# Patient Record
Sex: Female | Born: 1975 | Race: White | Hispanic: No | Marital: Married | Smoking: Never smoker
Health system: Southern US, Community
[De-identification: ages and names within clinical notes are randomized; demographics above are authoritative.]

## PROBLEM LIST (undated history)

## (undated) DIAGNOSIS — K219 Gastro-esophageal reflux disease without esophagitis: Secondary | ICD-10-CM

## (undated) DIAGNOSIS — K8020 Calculus of gallbladder without cholecystitis without obstruction: Secondary | ICD-10-CM

## (undated) DIAGNOSIS — F32A Depression, unspecified: Secondary | ICD-10-CM

## (undated) DIAGNOSIS — J45909 Unspecified asthma, uncomplicated: Secondary | ICD-10-CM

## (undated) DIAGNOSIS — F419 Anxiety disorder, unspecified: Secondary | ICD-10-CM

## (undated) DIAGNOSIS — N189 Chronic kidney disease, unspecified: Secondary | ICD-10-CM

## (undated) DIAGNOSIS — F329 Major depressive disorder, single episode, unspecified: Secondary | ICD-10-CM

## (undated) HISTORY — DX: Anxiety disorder, unspecified: F41.9

## (undated) HISTORY — DX: Gastro-esophageal reflux disease without esophagitis: K21.9

## (undated) HISTORY — DX: Unspecified asthma, uncomplicated: J45.909

## (undated) HISTORY — DX: Calculus of gallbladder without cholecystitis without obstruction: K80.20

## (undated) HISTORY — DX: Depression, unspecified: F32.A

## (undated) HISTORY — DX: Major depressive disorder, single episode, unspecified: F32.9

---

## 1999-10-03 ENCOUNTER — Inpatient Hospital Stay (HOSPITAL_COMMUNITY): Admission: AD | Admit: 1999-10-03 | Discharge: 1999-10-03 | Payer: Self-pay | Admitting: Obstetrics and Gynecology

## 1999-11-25 ENCOUNTER — Ambulatory Visit (HOSPITAL_BASED_OUTPATIENT_CLINIC_OR_DEPARTMENT_OTHER): Admission: RE | Admit: 1999-11-25 | Discharge: 1999-11-25 | Payer: Self-pay | Admitting: Orthopedic Surgery

## 2000-07-23 ENCOUNTER — Other Ambulatory Visit: Admission: RE | Admit: 2000-07-23 | Discharge: 2000-07-23 | Payer: Self-pay | Admitting: Internal Medicine

## 2002-05-30 ENCOUNTER — Other Ambulatory Visit: Admission: RE | Admit: 2002-05-30 | Discharge: 2002-05-30 | Payer: Self-pay | Admitting: Obstetrics and Gynecology

## 2002-10-23 ENCOUNTER — Other Ambulatory Visit: Admission: RE | Admit: 2002-10-23 | Discharge: 2002-10-23 | Payer: Self-pay | Admitting: Obstetrics and Gynecology

## 2003-03-16 ENCOUNTER — Other Ambulatory Visit: Admission: RE | Admit: 2003-03-16 | Discharge: 2003-03-16 | Payer: Self-pay | Admitting: Obstetrics and Gynecology

## 2003-08-13 ENCOUNTER — Ambulatory Visit (HOSPITAL_COMMUNITY): Admission: RE | Admit: 2003-08-13 | Discharge: 2003-08-13 | Payer: Self-pay | Admitting: Obstetrics and Gynecology

## 2003-08-21 ENCOUNTER — Inpatient Hospital Stay (HOSPITAL_COMMUNITY): Admission: AD | Admit: 2003-08-21 | Discharge: 2003-08-21 | Payer: Self-pay | Admitting: Obstetrics and Gynecology

## 2003-09-03 ENCOUNTER — Ambulatory Visit (HOSPITAL_COMMUNITY): Admission: RE | Admit: 2003-09-03 | Discharge: 2003-09-03 | Payer: Self-pay | Admitting: Obstetrics and Gynecology

## 2003-10-29 ENCOUNTER — Ambulatory Visit (HOSPITAL_COMMUNITY): Admission: RE | Admit: 2003-10-29 | Discharge: 2003-10-29 | Payer: Self-pay | Admitting: Obstetrics and Gynecology

## 2003-11-06 ENCOUNTER — Observation Stay (HOSPITAL_COMMUNITY): Admission: AD | Admit: 2003-11-06 | Discharge: 2003-11-07 | Payer: Self-pay | Admitting: Obstetrics and Gynecology

## 2003-11-09 ENCOUNTER — Inpatient Hospital Stay (HOSPITAL_COMMUNITY): Admission: AD | Admit: 2003-11-09 | Discharge: 2003-11-11 | Payer: Self-pay | Admitting: Obstetrics and Gynecology

## 2003-11-13 ENCOUNTER — Inpatient Hospital Stay (HOSPITAL_COMMUNITY): Admission: RE | Admit: 2003-11-13 | Discharge: 2003-11-13 | Payer: Self-pay | Admitting: Obstetrics and Gynecology

## 2003-11-15 ENCOUNTER — Inpatient Hospital Stay (HOSPITAL_COMMUNITY): Admission: AD | Admit: 2003-11-15 | Discharge: 2003-11-15 | Payer: Self-pay | Admitting: Obstetrics and Gynecology

## 2003-11-15 ENCOUNTER — Inpatient Hospital Stay (HOSPITAL_COMMUNITY): Admission: AD | Admit: 2003-11-15 | Discharge: 2003-11-21 | Payer: Self-pay | Admitting: Obstetrics and Gynecology

## 2003-11-18 ENCOUNTER — Encounter (INDEPENDENT_AMBULATORY_CARE_PROVIDER_SITE_OTHER): Payer: Self-pay | Admitting: *Deleted

## 2003-11-22 ENCOUNTER — Encounter: Admission: RE | Admit: 2003-11-22 | Discharge: 2003-12-22 | Payer: Self-pay | Admitting: Obstetrics and Gynecology

## 2003-12-14 ENCOUNTER — Ambulatory Visit (HOSPITAL_COMMUNITY): Admission: RE | Admit: 2003-12-14 | Discharge: 2003-12-14 | Payer: Self-pay | Admitting: Obstetrics and Gynecology

## 2004-01-11 ENCOUNTER — Other Ambulatory Visit: Admission: RE | Admit: 2004-01-11 | Discharge: 2004-01-11 | Payer: Self-pay | Admitting: Obstetrics and Gynecology

## 2004-07-25 ENCOUNTER — Ambulatory Visit: Payer: Self-pay | Admitting: Gastroenterology

## 2004-08-30 ENCOUNTER — Ambulatory Visit: Payer: Self-pay | Admitting: Gastroenterology

## 2004-10-11 ENCOUNTER — Ambulatory Visit: Payer: Self-pay | Admitting: Gastroenterology

## 2005-03-23 ENCOUNTER — Ambulatory Visit: Payer: Self-pay | Admitting: Internal Medicine

## 2005-04-11 ENCOUNTER — Ambulatory Visit: Payer: Self-pay | Admitting: Family Medicine

## 2005-04-27 ENCOUNTER — Ambulatory Visit: Payer: Self-pay | Admitting: Family Medicine

## 2005-05-01 ENCOUNTER — Encounter: Admission: RE | Admit: 2005-05-01 | Discharge: 2005-05-01 | Payer: Self-pay | Admitting: Family Medicine

## 2005-05-15 HISTORY — PX: CHOLECYSTECTOMY: SHX55

## 2005-09-18 ENCOUNTER — Ambulatory Visit: Payer: Self-pay | Admitting: Family Medicine

## 2005-11-10 ENCOUNTER — Encounter (INDEPENDENT_AMBULATORY_CARE_PROVIDER_SITE_OTHER): Payer: Self-pay | Admitting: Specialist

## 2005-11-10 ENCOUNTER — Ambulatory Visit (HOSPITAL_COMMUNITY): Admission: RE | Admit: 2005-11-10 | Discharge: 2005-11-10 | Payer: Self-pay | Admitting: General Surgery

## 2005-11-13 ENCOUNTER — Ambulatory Visit: Payer: Self-pay | Admitting: Family Medicine

## 2006-07-16 ENCOUNTER — Ambulatory Visit (HOSPITAL_COMMUNITY): Admission: RE | Admit: 2006-07-16 | Discharge: 2006-07-16 | Payer: Self-pay | Admitting: *Deleted

## 2006-08-10 ENCOUNTER — Inpatient Hospital Stay (HOSPITAL_COMMUNITY): Admission: AD | Admit: 2006-08-10 | Discharge: 2006-08-13 | Payer: Self-pay | Admitting: *Deleted

## 2006-09-04 DIAGNOSIS — F329 Major depressive disorder, single episode, unspecified: Secondary | ICD-10-CM | POA: Insufficient documentation

## 2006-09-04 DIAGNOSIS — S6990XA Unspecified injury of unspecified wrist, hand and finger(s), initial encounter: Secondary | ICD-10-CM | POA: Insufficient documentation

## 2006-09-04 DIAGNOSIS — F321 Major depressive disorder, single episode, moderate: Secondary | ICD-10-CM | POA: Insufficient documentation

## 2006-09-04 DIAGNOSIS — F32A Depression, unspecified: Secondary | ICD-10-CM | POA: Insufficient documentation

## 2006-09-04 DIAGNOSIS — S6980XA Other specified injuries of unspecified wrist, hand and finger(s), initial encounter: Secondary | ICD-10-CM | POA: Insufficient documentation

## 2006-09-04 DIAGNOSIS — K319 Disease of stomach and duodenum, unspecified: Secondary | ICD-10-CM | POA: Insufficient documentation

## 2006-09-04 DIAGNOSIS — K219 Gastro-esophageal reflux disease without esophagitis: Secondary | ICD-10-CM

## 2006-09-04 DIAGNOSIS — Z9189 Other specified personal risk factors, not elsewhere classified: Secondary | ICD-10-CM

## 2007-02-27 LAB — CONVERTED CEMR LAB: Pap Smear: NORMAL

## 2007-07-18 ENCOUNTER — Ambulatory Visit: Payer: Self-pay | Admitting: Family Medicine

## 2007-07-18 LAB — CONVERTED CEMR LAB
Protein, U semiquant: NEGATIVE
Urobilinogen, UA: NEGATIVE
WBC Urine, dipstick: NEGATIVE

## 2007-07-24 ENCOUNTER — Telehealth (INDEPENDENT_AMBULATORY_CARE_PROVIDER_SITE_OTHER): Payer: Self-pay | Admitting: *Deleted

## 2007-07-24 LAB — CONVERTED CEMR LAB
Basophils Absolute: 0.1 10*3/uL (ref 0.0–0.1)
Basophils Relative: 2 % — ABNORMAL HIGH (ref 0.0–1.0)
CO2: 29 meq/L (ref 19–32)
Cholesterol: 176 mg/dL (ref 0–200)
Creatinine, Ser: 0.9 mg/dL (ref 0.4–1.2)
GFR calc Af Amer: 92 mL/min
HCT: 40.7 % (ref 36.0–46.0)
HDL: 46.5 mg/dL (ref >39.0)
Hemoglobin: 13.4 g/dL (ref 12.0–15.0)
LDL Cholesterol: 111 mg/dL — ABNORMAL HIGH (ref 0–99)
Lymphocytes Relative: 66.5 % — ABNORMAL HIGH (ref 12.0–46.0)
MCHC: 33 g/dL (ref 30.0–36.0)
Monocytes Absolute: 0.7 10*3/uL (ref 0.2–0.7)
Monocytes Relative: 18.3 % — ABNORMAL HIGH (ref 3.0–11.0)
Neutro Abs: 0.4 10*3/uL — ABNORMAL LOW (ref 1.4–7.7)
Neutrophils Relative %: 11.9 % — ABNORMAL LOW (ref 43.0–77.0)
Potassium: 4.2 meq/L (ref 3.5–5.1)
RDW: 11.9 % (ref 11.5–14.6)
Sodium: 139 meq/L (ref 135–145)
TSH: 1.45 microintl units/mL (ref 0.35–5.50)
Total Bilirubin: 0.7 mg/dL (ref 0.3–1.2)
Total Protein: 7.3 g/dL (ref 6.0–8.3)

## 2007-08-05 ENCOUNTER — Telehealth (INDEPENDENT_AMBULATORY_CARE_PROVIDER_SITE_OTHER): Payer: Self-pay | Admitting: *Deleted

## 2007-08-05 DIAGNOSIS — R1011 Right upper quadrant pain: Secondary | ICD-10-CM | POA: Insufficient documentation

## 2007-08-06 ENCOUNTER — Telehealth (INDEPENDENT_AMBULATORY_CARE_PROVIDER_SITE_OTHER): Payer: Self-pay | Admitting: *Deleted

## 2007-08-07 ENCOUNTER — Encounter: Payer: Self-pay | Admitting: Family Medicine

## 2007-08-08 ENCOUNTER — Encounter (INDEPENDENT_AMBULATORY_CARE_PROVIDER_SITE_OTHER): Payer: Self-pay | Admitting: *Deleted

## 2007-08-22 ENCOUNTER — Ambulatory Visit: Payer: Self-pay | Admitting: Family Medicine

## 2007-08-23 LAB — CONVERTED CEMR LAB
Basophils Relative: 0.9 % (ref 0.0–1.0)
Eosinophils Relative: 1.4 % (ref 0.0–5.0)
Lymphocytes Relative: 24.4 % (ref 12.0–46.0)
Monocytes Relative: 6.6 % (ref 3.0–12.0)
Neutrophils Relative %: 66.7 % (ref 43.0–77.0)
Platelets: 284 10*3/uL (ref 150–400)
RBC: 4.6 M/uL (ref 3.87–5.11)
WBC: 6.8 10*3/uL (ref 4.5–10.5)

## 2007-08-25 ENCOUNTER — Encounter (INDEPENDENT_AMBULATORY_CARE_PROVIDER_SITE_OTHER): Payer: Self-pay | Admitting: *Deleted

## 2007-08-28 ENCOUNTER — Ambulatory Visit: Payer: Self-pay | Admitting: Gastroenterology

## 2007-10-15 ENCOUNTER — Ambulatory Visit: Payer: Self-pay | Admitting: Family Medicine

## 2007-10-21 ENCOUNTER — Encounter: Admission: RE | Admit: 2007-10-21 | Discharge: 2007-10-21 | Payer: Self-pay | Admitting: *Deleted

## 2007-10-30 ENCOUNTER — Encounter: Payer: Self-pay | Admitting: Family Medicine

## 2008-01-14 ENCOUNTER — Encounter: Payer: Self-pay | Admitting: Family Medicine

## 2008-01-14 ENCOUNTER — Telehealth (INDEPENDENT_AMBULATORY_CARE_PROVIDER_SITE_OTHER): Payer: Self-pay | Admitting: *Deleted

## 2008-01-23 ENCOUNTER — Telehealth (INDEPENDENT_AMBULATORY_CARE_PROVIDER_SITE_OTHER): Payer: Self-pay | Admitting: *Deleted

## 2008-03-06 ENCOUNTER — Ambulatory Visit: Payer: Self-pay | Admitting: Family Medicine

## 2008-04-21 ENCOUNTER — Telehealth: Payer: Self-pay | Admitting: Family Medicine

## 2008-07-30 ENCOUNTER — Ambulatory Visit: Payer: Self-pay | Admitting: Family Medicine

## 2008-07-30 DIAGNOSIS — J019 Acute sinusitis, unspecified: Secondary | ICD-10-CM | POA: Insufficient documentation

## 2008-10-02 ENCOUNTER — Ambulatory Visit: Payer: Self-pay | Admitting: Family Medicine

## 2008-10-02 DIAGNOSIS — R5381 Other malaise: Secondary | ICD-10-CM

## 2008-10-02 DIAGNOSIS — R5383 Other fatigue: Secondary | ICD-10-CM

## 2008-10-13 LAB — CONVERTED CEMR LAB
AST: 19 units/L (ref 0–37)
Albumin: 4.3 g/dL (ref 3.5–5.2)
Alkaline Phosphatase: 57 units/L (ref 39–117)
BUN: 12 mg/dL (ref 6–23)
Calcium: 9.3 mg/dL (ref 8.4–10.5)
Chloride: 107 meq/L (ref 96–112)
Creatinine, Ser: 0.9 mg/dL (ref 0.4–1.2)
Eosinophils Relative: 0.2 % (ref 0.0–5.0)
Free T4: 0.6 ng/dL (ref 0.6–1.6)
GFR calc non Af Amer: 75.57 mL/min (ref >60)
HDL: 63.8 mg/dL (ref >39.00)
Lymphocytes Relative: 22.3 % (ref 12.0–46.0)
MCV: 91.9 fL (ref 78.0–100.0)
Neutrophils Relative %: 70.7 % (ref 43.0–77.0)
Platelets: 236 10*3/uL (ref 150.0–400.0)
Total CHOL/HDL Ratio: 3
Total Protein: 7.1 g/dL (ref 6.0–8.3)
Triglycerides: 56 mg/dL (ref 0.0–149.0)
VLDL: 11.2 mg/dL (ref 0.0–40.0)
Vitamin B-12: 222 pg/mL (ref 211–911)
WBC: 7.5 10*3/uL (ref 4.5–10.5)

## 2008-10-14 ENCOUNTER — Telehealth (INDEPENDENT_AMBULATORY_CARE_PROVIDER_SITE_OTHER): Payer: Self-pay | Admitting: *Deleted

## 2008-10-14 ENCOUNTER — Encounter (INDEPENDENT_AMBULATORY_CARE_PROVIDER_SITE_OTHER): Payer: Self-pay | Admitting: *Deleted

## 2008-10-16 ENCOUNTER — Ambulatory Visit: Payer: Self-pay | Admitting: Family Medicine

## 2008-10-16 DIAGNOSIS — E538 Deficiency of other specified B group vitamins: Secondary | ICD-10-CM

## 2008-10-16 DIAGNOSIS — E539 Vitamin B deficiency, unspecified: Secondary | ICD-10-CM | POA: Insufficient documentation

## 2008-10-20 ENCOUNTER — Telehealth (INDEPENDENT_AMBULATORY_CARE_PROVIDER_SITE_OTHER): Payer: Self-pay | Admitting: *Deleted

## 2008-10-23 ENCOUNTER — Ambulatory Visit: Payer: Self-pay | Admitting: Family Medicine

## 2008-10-30 ENCOUNTER — Ambulatory Visit: Payer: Self-pay | Admitting: Family Medicine

## 2008-11-06 ENCOUNTER — Ambulatory Visit: Payer: Self-pay | Admitting: Family Medicine

## 2008-12-04 ENCOUNTER — Ambulatory Visit: Payer: Self-pay | Admitting: Family Medicine

## 2009-01-01 ENCOUNTER — Ambulatory Visit: Payer: Self-pay | Admitting: Family Medicine

## 2009-02-12 ENCOUNTER — Ambulatory Visit: Payer: Self-pay | Admitting: Family Medicine

## 2009-03-03 ENCOUNTER — Encounter: Payer: Self-pay | Admitting: Family Medicine

## 2009-09-15 ENCOUNTER — Ambulatory Visit: Payer: Self-pay | Admitting: Family Medicine

## 2009-09-15 DIAGNOSIS — M549 Dorsalgia, unspecified: Secondary | ICD-10-CM | POA: Insufficient documentation

## 2010-01-26 ENCOUNTER — Telehealth (INDEPENDENT_AMBULATORY_CARE_PROVIDER_SITE_OTHER): Payer: Self-pay | Admitting: *Deleted

## 2010-02-03 ENCOUNTER — Encounter: Payer: Self-pay | Admitting: Family Medicine

## 2010-02-03 ENCOUNTER — Telehealth: Payer: Self-pay | Admitting: Family Medicine

## 2010-06-06 ENCOUNTER — Encounter: Payer: Self-pay | Admitting: Obstetrics and Gynecology

## 2010-06-07 ENCOUNTER — Ambulatory Visit: Admit: 2010-06-07 | Payer: Self-pay | Admitting: Family Medicine

## 2010-06-14 NOTE — Progress Notes (Signed)
Summary: Lansoprazole PA Approved  Phone Note Refill Request Message from:  Fax from Pharmacy on February 03, 2010 9:34 AM  Refills Requested: Medication #1:  PREVACID 30 MG CPDR 1 by mouth once daily [facility] [address] - fax [phone]  Initial call taken by: Okey Regal [place],  February 03, 2010 9:36 AM  Follow-up for Phone Call        awaiting fax.CASE # [id number]..............Marland KitchenFelecia Deloach CMA  February 03, 2010 10:03 AM  prior auth faxed back awaiting response............Marland KitchenFelecia Deloach CMA  February 03, 2010 11:03 AM   Additional Follow-up for Phone Call Additional follow up Details #1::        Approved until 01/2011. Additional Follow-up by: Lucious Groves CMA,  February 04, 2010 8:32 AM

## 2010-06-14 NOTE — Medication Information (Signed)
Summary: Prior Authorization for Lansoprazole/[employer]  Prior Authorization for Lansoprazole/[employer]   Imported By: Lanelle Bal 02/14/2010 09:01:53  _____________________________________________________________________  External Attachment:    Type:   Image     Comment:   External Document

## 2010-06-14 NOTE — Medication Information (Signed)
Summary: Approval for Additional Quantity Lansoprazole/[facility]  Approval for Additional Quantity Lansoprazole/[facility]   Imported By: Lanelle Bal 02/14/2010 08:32:23  _____________________________________________________________________  External Attachment:    Type:   Image     Comment:   External Document

## 2010-06-14 NOTE — Assessment & Plan Note (Signed)
Summary: back pain/cbs   Vital Signs:  Patient profile:   35 year old female Height:      64 inches Weight:      229 pounds BMI:     39.45 Pulse rate:   76 / minute Pulse rhythm:   regular BP sitting:   122 / 80  (left arm) Cuff size:   regular  Vitals Entered By: Army Fossa CMA (Sep 15, 2009 11:17 AM) CC: Pt here for lower back pain on her right side that shoots into her leg. Started yesterday. , Back Pain   History of Present Illness:       This is a 35 year old woman who presents with Back Pain.  The symptoms began 1 day ago.  Pt got up from computer yesterday and felt pain R side of back and it radiates down front of R leg.  The patient denies fever, chills, weakness, loss of sensation, fecal incontinence, urinary incontinence, urinary retention, dysuria, rest pain, inability to work, and inability to care for self.  The pain is located in the right low back.  The pain began at home and suddenly.  The pain radiates to the right hip.  The pain is made worse by flexion.  The pain is made better by inactivity and ice.   Ibuprofen did nothing.    Current Medications (verified): 1)  Prevacid 30 Mg Cpdr (Lansoprazole) .Marland Kitchen.. 1 By Mouth Once Daily 2)  Alprazolam 0.5 Mg Tabs (Alprazolam) .... Take 1 Tab By Mouth Two Times A Day 3)  Flonase 50 Mcg/act Susp (Fluticasone Propionate) .... 2 Sprays Each Nostril Once Daily 4)  Proair Hfa 108 (90 Base) Mcg/act Aers (Albuterol Sulfate) .... 2 Puffs Qid As Needed 5)  Cymbalta 60 Mg Cpep (Duloxetine Hcl) .Marland Kitchen.. 1 By Mouth Daily 6)  Abilify 5 Mg Tabs (Aripiprazole) .Marland Kitchen.. 1 By Mouth Daily 7)  Wellbutrin Xl 300 Mg Xr24h-Tab (Bupropion Hcl) .Marland Kitchen.. 1 By Mouth Daily 8)  Soma 350 Mg Tabs (Carisoprodol) .Marland Kitchen.. 1 By Mouth Qid As Needed 9)  Vicodin Es 7.5-750 Mg Tabs (Hydrocodone-Acetaminophen) .Marland Kitchen.. 1 By Mouth Every 6 Hours As Needed Pain  Allergies: 1)  ! Sulfa 2)  ! Amoxicillin 3)  ! Labetalol Hcl (Labetalol Hcl)  Past History:  Past medical, surgical,  family and social histories (including risk factors) reviewed for relevance to current acute and chronic problems.  Past Medical History: Reviewed history from 09/04/2006 and no changes required. Depression GERD  Past Surgical History: Reviewed history from 09/04/2006 and no changes required. Cholecystectomy Caesarean section  Family History: Reviewed history from 07/18/2007 and no changes required. MOTHER:LIVING FATHER:LIVING Family History Hypertension: FATHER'S SIDE OF THE FAMILY Family History Breast cancer 1st degree relative <50:MOTHER CANCER: PANCREAS(MGF) Family History of Arthritis:PGM  Social History: Reviewed history from 10/02/2008 and no changes required. Occupation: Writer Married Never Smoked Alcohol use-no Drug use-no Regular exercise-yes  Review of Systems      See HPI  Physical Exam  General:  Well-developed,well-nourished,in no acute distress; alert,appropriate and cooperative throughout examination Msk:  normal ROM, no joint tenderness, no joint swelling, no joint warmth, and no redness over joints.   Neurologic:  alert & oriented X3, strength normal in all extremities, gait normal, and DTRs symmetrical and normal.   Psych:  Oriented X3 and normally interactive.     Impression & Recommendations:  Problem # 1:  BACK PAIN (ICD-724.5)  Her updated medication list for this problem includes:    Soma 350 Mg  Tabs (Carisoprodol) .Marland Kitchen... 1 by mouth qid as needed    Vicodin Es 7.5-750 Mg Tabs (Hydrocodone-acetaminophen) .Marland Kitchen... 1 by mouth every 6 hours as needed pain  Discussed use of moist heat or ice, modified activities, medications, and stretching/strengthening exercises. Back care instructions given. To be seen in 2 weeks if no improvement; sooner if worsening of symptoms.   Complete Medication List: 1)  Prevacid 30 Mg Cpdr (Lansoprazole) .Marland Kitchen.. 1 by mouth once daily 2)  Alprazolam 0.5 Mg Tabs (Alprazolam) .... Take 1 tab by mouth two times  a day 3)  Flonase 50 Mcg/act Susp (Fluticasone propionate) .... 2 sprays each nostril once daily 4)  Proair Hfa 108 (90 Base) Mcg/act Aers (Albuterol sulfate) .... 2 puffs qid as needed 5)  Cymbalta 60 Mg Cpep (Duloxetine hcl) .Marland Kitchen.. 1 by mouth daily 6)  Abilify 5 Mg Tabs (Aripiprazole) .Marland Kitchen.. 1 by mouth daily 7)  Wellbutrin Xl 300 Mg Xr24h-tab (Bupropion hcl) .Marland Kitchen.. 1 by mouth daily 8)  Soma 350 Mg Tabs (Carisoprodol) .Marland Kitchen.. 1 by mouth qid as needed 9)  Vicodin Es 7.5-750 Mg Tabs (Hydrocodone-acetaminophen) .Marland Kitchen.. 1 by mouth every 6 hours as needed pain Prescriptions: PREVACID 30 MG CPDR (LANSOPRAZOLE) 1 by mouth once daily  #90 x 3   Entered and Authorized by:   Loreen Freud DO   Signed by:   Loreen Freud DO on 09/15/2009   Method used:   Electronically to        [facility] [phone]* (retail)       [address]       [place], [place]  [address]       Ph: [phone]       Fax: [phone]   RxID:   [id number] VICODIN ES 7.5-750 MG TABS (HYDROCODONE-ACETAMINOPHEN) 1 by mouth every 6 hours as needed pain  #30 x 0   Entered and Authorized by:   Loreen Freud DO   Signed by:   Loreen Freud DO on 09/15/2009   Method used:   Print then Give to Patient   RxID:   [id number] SOMA 350 MG TABS (CARISOPRODOL) 1 by mouth qid as needed  #60 x 0   Entered and Authorized by:   Loreen Freud DO   Signed by:   Loreen Freud DO on 09/15/2009   Method used:   Electronically to        [facility] [address] #[id number]* (retail)       [address]       [place], [place]  [address]       Ph: [phone]       Fax: [phone]   RxID:   [id number]

## 2010-06-14 NOTE — Progress Notes (Signed)
Summary: LANSOPRAZOLE (PREVACID) REFILL 9/14  Phone Note Refill Request Message from:  Fax from Pharmacy on January 26, 2010 11:27 AM  Refills Requested: Medication #1:  PREVACID 30 MG CPDR 1 by mouth once daily   Last Refilled: 09/15/2009 FAX FOR [facility]     [facility]   [address]     FAX  [phone]   QTY = 90    NOTE FROM [facility] DOES NOT COVER THIS MEDICATION---CALL [phone] TO INITIATE PRIOR AUTH OR CALL/FAX PHARMACY TO CHANGE MED--PT# [phone]  Initial call taken by: Jerolyn Shin,  January 26, 2010 11:36 AM  Follow-up for Phone Call        Plan doesn't cover Rx would you like to change or start prior Authorization.    Almeta Monas CMA Duncan Dull)  January 26, 2010 1:41 PM  Follow-up by: Almeta Monas CMA Duncan Dull),  January 26, 2010 1:41 PM  Additional Follow-up for Phone Call Additional follow up Details #1::        I need to know what her plan does cover---she can call ins co and find out or drop off formulary Additional Follow-up by: Loreen Freud DO,  January 26, 2010 1:48 PM    Additional Follow-up for Phone Call Additional follow up Details #2::    Left message to call back  Follow-up by: Almeta Monas CMA Duncan Dull),  January 26, 2010 1:53 PM  Additional Follow-up for Phone Call Additional follow up Details #3:: Details for Additional Follow-up Action Taken: pt aware, will call ins comp and get back with [place]. Call ended Additional Follow-up by: Almeta Monas CMA Duncan Dull),  January 26, 2010 1:58 PM

## 2010-07-19 ENCOUNTER — Encounter: Payer: Self-pay | Admitting: Family Medicine

## 2010-07-21 ENCOUNTER — Encounter: Payer: Self-pay | Admitting: Family Medicine

## 2010-07-21 ENCOUNTER — Encounter (INDEPENDENT_AMBULATORY_CARE_PROVIDER_SITE_OTHER): Payer: BC Managed Care – PPO | Admitting: Family Medicine

## 2010-07-21 DIAGNOSIS — Z0000 Encounter for general adult medical examination without abnormal findings: Secondary | ICD-10-CM

## 2010-07-21 DIAGNOSIS — E538 Deficiency of other specified B group vitamins: Secondary | ICD-10-CM

## 2010-07-26 NOTE — Assessment & Plan Note (Signed)
Summary: cpe/fasting/kn   Vital Signs:  Patient profile:   35 year old female Height:      64.5 inches Weight:      251.2 pounds BMI:     42.61 Pulse rate:   76 / minute Pulse rhythm:   regular BP sitting:   120 / 88  (right arm) Cuff size:   large  Vitals Entered By: Almeta Monas CMA Duncan Dull) (July 21, 2010 10:42 AM) CC: CPX/Not Fasting---No pap needed   History of Present Illness: Pt here for cpe ---Pt sees GYN- Dr Cherly Hensen.   No complaints.    Preventive Screening-Counseling & Management  Alcohol-Tobacco     Alcohol drinks/day: <1     Smoking Status: never     Passive Smoke Exposure: no  Caffeine-Diet-Exercise     Caffeine use/day: 3     Diet Comments: weight watchers     Diet Counseling: not indicated; diet is assessed to be healthy     Does Patient Exercise: no     Times/week: <3     Exercise Counseling: to improve exercise regimen  Hep-HIV-STD-Contraception     Dental Visit-last 6 months yes     Dental Care Counseling: not indicated; dental care within six months     SBE monthly: yes     Sun Exposure-Excessive: no     Sun Exposure Counseling: to decrease sun exposure  Safety-Violence-Falls     Seat Belt Use: yes     Firearms in the Home: no firearms in the home     Smoke Detectors: yes     Violence in the Home: no risk noted     Sexual Abuse: no      Sexual History:  currently monogamous.        Drug Use:  never.        Blood Transfusions:  no.        Travel History:  [place].    Problems Prior to Update: 1)  Back Pain  (ICD-724.5) 2)  Vitamin B12 Deficiency  (ICD-266.2) 3)  B12 Def  () 4)  Fatigue  (ICD-780.79) 5)  Sinusitis - Acute-nos  (ICD-461.9) 6)  Ruq Pain  (ICD-789.01) 7)  Preventive Health Care  (ICD-V70.0) 8)  Family History Breast Cancer 1st Degree Relative <50  (ICD-V16.3) 9)  Colposcopy, Hx of  (ICD-V15.89) 10)  Injury, Finger  (ICD-959.5) 11)  Gerd  (ICD-530.81) 12)  Depression  (ICD-311)  Medications Prior to Update: 1)   Prevacid 30 Mg Cpdr (Lansoprazole) .Marland Kitchen.. 1 By Mouth Once Daily 2)  Alprazolam 0.5 Mg Tabs (Alprazolam) .... Take 1 Tab By Mouth Two Times A Day 3)  Flonase 50 Mcg/act Susp (Fluticasone Propionate) .... 2 Sprays Each Nostril Once Daily 4)  Proair Hfa 108 (90 Base) Mcg/act Aers (Albuterol Sulfate) .... 2 Puffs Qid As Needed 5)  Cymbalta 60 Mg Cpep (Duloxetine Hcl) .Marland Kitchen.. 1 By Mouth Daily 6)  Abilify 5 Mg Tabs (Aripiprazole) .Marland Kitchen.. 1 By Mouth Daily 7)  Wellbutrin Xl 300 Mg Xr24h-Tab (Bupropion Hcl) .Marland Kitchen.. 1 By Mouth Daily 8)  Soma 350 Mg Tabs (Carisoprodol) .Marland Kitchen.. 1 By Mouth Qid As Needed 9)  Vicodin Es 7.5-750 Mg Tabs (Hydrocodone-Acetaminophen) .Marland Kitchen.. 1 By Mouth Every 6 Hours As Needed Pain  Current Medications (verified): 1)  Prevacid 30 Mg Cpdr (Lansoprazole) .Marland Kitchen.. 1 By Mouth Once Daily 2)  Alprazolam 0.5 Mg Tabs (Alprazolam) .... Take 1 Tab By Mouth Two Times A Day 3)  Proair Hfa 108 (90 Base) Mcg/act Aers (Albuterol Sulfate) .Marland KitchenMarland KitchenMarland Kitchen  2 Puffs Qid As Needed 4)  Viibryd 40 Mg Tabs (Vilazodone Hcl) .Marland Kitchen.. 1 By Mouth Once Daily  Allergies (verified): 1)  ! Sulfa 2)  ! Amoxicillin 3)  ! Labetalol Hcl (Labetalol Hcl)  Past History:  Past Medical History: Last updated: 09/04/2006 Depression GERD  Past Surgical History: Last updated: 09/04/2006 Cholecystectomy Caesarean section  Family History: Last updated: 07/18/2007 MOTHER:LIVING FATHER:LIVING Family History Hypertension: FATHER'S SIDE OF THE FAMILY Family History Breast cancer 1st degree relative <50:MOTHER CANCER: PANCREAS(MGF) Family History of Arthritis:PGM  Social History: Last updated: 07/21/2010 Occupation: Publishing rights manager Married Never Smoked Alcohol use-no Drug use-no Regular exercise-yes  Risk Factors: Alcohol Use: <1 (07/21/2010) Caffeine Use: 3 (07/21/2010) Diet: weight watchers (07/21/2010) Exercise: no (07/21/2010)  Risk Factors: Smoking Status: never (07/21/2010) Passive Smoke Exposure: no (07/21/2010)  Family  History: Reviewed history from 07/18/2007 and no changes required. MOTHER:LIVING FATHER:LIVING Family History Hypertension: FATHER'S SIDE OF THE FAMILY Family History Breast cancer 1st degree relative <50:MOTHER CANCER: PANCREAS(MGF) Family History of Arthritis:PGM  Social History: Reviewed history from 10/02/2008 and no changes required. Occupation: Publishing rights manager Married Never Smoked Alcohol use-no Drug use-no Regular exercise-yes Does Patient Exercise:  no  Review of Systems      See HPI Eyes:  Denies blurring, discharge, double vision, eye irritation, eye pain, halos, itching, light sensitivity, red eye, vision loss-1 eye, and vision loss-both eyes; optho q1y. ENT:  Denies decreased hearing, difficulty swallowing, ear discharge, earache, hoarseness, nasal congestion, nosebleeds, postnasal drainage, ringing in ears, sinus pressure, and sore throat. CV:  Denies bluish discoloration of lips or nails, chest pain or discomfort, difficulty breathing at night, difficulty breathing while lying down, fainting, fatigue, leg cramps with exertion, lightheadness, near fainting, palpitations, shortness of breath with exertion, swelling of feet, swelling of hands, and weight gain. Resp:  Denies chest discomfort, chest pain with inspiration, cough, coughing up blood, excessive snoring, hypersomnolence, morning headaches, pleuritic, shortness of breath, sputum productive, and wheezing. GI:  Denies abdominal pain, bloody stools, change in bowel habits, constipation, dark tarry stools, diarrhea, excessive appetite, gas, hemorrhoids, indigestion, loss of appetite, nausea, vomiting, vomiting blood, and yellowish skin color. GU:  Denies abnormal vaginal bleeding, decreased libido, discharge, dysuria, genital sores, hematuria, incontinence, nocturia, urinary frequency, and urinary hesitancy. MS:  Denies joint pain, joint redness, joint swelling, loss of strength, low back pain, mid back pain, muscle  aches, muscle , cramps, muscle weakness, stiffness, and thoracic pain. Derm:  Denies changes in color of skin, changes in nail beds, dryness, excessive perspiration, flushing, hair loss, insect bite(s), itching, lesion(s), poor wound healing, and rash. Neuro:  Denies brief paralysis, difficulty with concentration, disturbances in coordination, falling down, headaches, inability to speak, memory loss, numbness, poor balance, seizures, sensation of room spinning, tingling, tremors, visual disturbances, and weakness. Psych:  Complains of depression and easily tearful; denies alternate hallucination ( auditory/visual), anxiety, easily angered, irritability, mental problems, panic attacks, sense of great danger, suicidal thoughts/plans, thoughts of violence, unusual visions or sounds, and thoughts /plans of harming others. Endo:  Denies cold intolerance, excessive hunger, excessive thirst, excessive urination, heat intolerance, polyuria, and weight change. Heme:  Denies abnormal bruising, bleeding, enlarge lymph nodes, fevers, pallor, and skin discoloration. Allergy:  Denies hives or rash, itching eyes, persistent infections, seasonal allergies, and sneezing.  Physical Exam  General:  Well-developed,well-nourished,in no acute distress; alert,appropriate and cooperative throughout examination Eyes:  vision grossly intact, pupils equal, pupils round, pupils reactive to light, and no injection.   Ears:  External ear exam shows  no significant lesions or deformities.  Otoscopic examination reveals clear canals, tympanic membranes are intact bilaterally without bulging, retraction, inflammation or discharge. Hearing is grossly normal bilaterally. Nose:  External nasal examination shows no deformity or inflammation. Nasal mucosa are pink and moist without lesions or exudates. Mouth:  Oral mucosa and oropharynx without lesions or exudates.  Teeth in good repair. Neck:  No deformities, masses, or tenderness  noted. Breasts:  gyn Lungs:  Normal respiratory effort, chest expands symmetrically. Lungs are clear to auscultation, no crackles or wheezes. Heart:  normal rate and no murmur.   Abdomen:  Bowel sounds positive,abdomen soft and non-tender without masses, organomegaly or hernias noted. Genitalia:  gyn Msk:  normal ROM, no joint tenderness, no joint swelling, no joint warmth, no redness over joints, no joint deformities, no joint instability, and no crepitation.   Pulses:  R and L carotid,radial,femoral,dorsalis pedis and posterior tibial pulses are full and equal bilaterally Extremities:  No clubbing, cyanosis, edema, or deformity noted with normal full range of motion of all joints.   Neurologic:  No cranial nerve deficits noted. Station and gait are normal. Plantar reflexes are down-going bilaterally. DTRs are symmetrical throughout. Sensory, motor and coordinative functions appear intact. Skin:  Intact without suspicious lesions or rashes Cervical Nodes:  No lymphadenopathy noted Psych:  Cognition and judgment appear intact. Alert and cooperative with normal attention span and concentration. No apparent delusions, illusions, hallucinations   Impression & Recommendations:  Problem # 1:  PREVENTIVE HEALTH CARE (ICD-V70.0) ghm utd  Orders: Venipuncture (04540)  Problem # 2:  DEPRESSION (ICD-311)  con't f/u with psych---  Dr Evelene Croon  The following medications were removed from the medication list:    Cymbalta 60 Mg Cpep (Duloxetine hcl) .Marland Kitchen... 1 by mouth daily    Wellbutrin Xl 300 Mg Xr24h-tab (Bupropion hcl) .Marland Kitchen... 1 by mouth daily Her updated medication list for this problem includes:    Alprazolam 0.5 Mg Tabs (Alprazolam) .Marland Kitchen... Take 1 tab by mouth two times a day    Viibryd 40 Mg Tabs (Vilazodone hcl) .Marland Kitchen... 1 by mouth once daily  Discussed treatment options, including trial of antidpressant medication. Will refer to behavioral health. Follow-up call in in 24-48 hours and recheck in 2  weeks, sooner as needed. Patient agrees to call if any worsening of symptoms or thoughts of doing harm arise. Verified that the patient has no suicidal ideation at this time.   Problem # 3:  VITAMIN B12 DEFICIENCY (ICD-266.2)  Complete Medication List: 1)  Prevacid 30 Mg Cpdr (Lansoprazole) .Marland Kitchen.. 1 by mouth once daily 2)  Alprazolam 0.5 Mg Tabs (Alprazolam) .... Take 1 tab by mouth two times a day 3)  Proair Hfa 108 (90 Base) Mcg/act Aers (Albuterol sulfate) .... 2 puffs qid as needed 4)  Viibryd 40 Mg Tabs (Vilazodone hcl) .Marland Kitchen.. 1 by mouth once daily  Patient Instructions: 1)  rto fasting labs   Orders Added: 1)  Venipuncture [36415] 2)  Est. Patient 18-39 years [99395]    Flu Vaccine Next Due:  Refused Last PAP:  Normal (02/27/2007 2:10:44 PM) PAP Result Date:  03/03/2009 PAP Result:  normal PAP Next Due:  1 yr

## 2010-07-28 ENCOUNTER — Telehealth: Payer: Self-pay | Admitting: Family Medicine

## 2010-07-28 ENCOUNTER — Other Ambulatory Visit: Payer: Self-pay | Admitting: Family Medicine

## 2010-07-28 ENCOUNTER — Other Ambulatory Visit (INDEPENDENT_AMBULATORY_CARE_PROVIDER_SITE_OTHER): Payer: BC Managed Care – PPO

## 2010-07-28 ENCOUNTER — Encounter (INDEPENDENT_AMBULATORY_CARE_PROVIDER_SITE_OTHER): Payer: Self-pay | Admitting: *Deleted

## 2010-07-28 DIAGNOSIS — E785 Hyperlipidemia, unspecified: Secondary | ICD-10-CM

## 2010-07-28 DIAGNOSIS — Z0000 Encounter for general adult medical examination without abnormal findings: Secondary | ICD-10-CM

## 2010-07-28 LAB — LIPID PANEL
Cholesterol: 218 mg/dL — ABNORMAL HIGH (ref 0–200)
Total CHOL/HDL Ratio: 5
Triglycerides: 96 mg/dL (ref 0.0–149.0)
VLDL: 19.2 mg/dL (ref 0.0–40.0)

## 2010-07-28 LAB — CBC WITH DIFFERENTIAL/PLATELET
Basophils Relative: 0.6 % (ref 0.0–3.0)
Eosinophils Relative: 1.9 % (ref 0.0–5.0)
Lymphocytes Relative: 26.7 % (ref 12.0–46.0)
Neutrophils Relative %: 64 % (ref 43.0–77.0)
RBC: 4.22 Mil/uL (ref 3.87–5.11)
WBC: 6.5 10*3/uL (ref 4.5–10.5)

## 2010-07-28 LAB — HEPATIC FUNCTION PANEL
ALT: 13 U/L (ref 0–35)
Alkaline Phosphatase: 66 U/L (ref 39–117)
Bilirubin, Direct: 0.1 mg/dL (ref 0.0–0.3)
Total Protein: 6.7 g/dL (ref 6.0–8.3)

## 2010-07-28 LAB — BASIC METABOLIC PANEL
Calcium: 9.2 mg/dL (ref 8.4–10.5)
Creatinine, Ser: 0.9 mg/dL (ref 0.4–1.2)

## 2010-07-28 LAB — LDL CHOLESTEROL, DIRECT: Direct LDL: 154.3 mg/dL

## 2010-08-02 NOTE — Progress Notes (Signed)
----   Converted from flag ---- ---- 07/28/2010 8:33 AM, Floydene Flock wrote: pt. came for her labs today...could not leave urine...voided before she left home ------------------------------

## 2010-08-10 ENCOUNTER — Ambulatory Visit: Payer: BC Managed Care – PPO | Admitting: Internal Medicine

## 2010-08-10 ENCOUNTER — Ambulatory Visit (INDEPENDENT_AMBULATORY_CARE_PROVIDER_SITE_OTHER): Payer: BC Managed Care – PPO | Admitting: Internal Medicine

## 2010-08-10 ENCOUNTER — Encounter: Payer: Self-pay | Admitting: Internal Medicine

## 2010-08-10 VITALS — BP 120/82 | HR 80 | Temp 98.8°F

## 2010-08-10 DIAGNOSIS — H579 Unspecified disorder of eye and adnexa: Secondary | ICD-10-CM

## 2010-08-10 DIAGNOSIS — H5789 Other specified disorders of eye and adnexa: Secondary | ICD-10-CM

## 2010-08-10 MED ORDER — ERYTHROMYCIN 5 MG/GM OP OINT: TOPICAL_OINTMENT | Freq: Every day | OPHTHALMIC | Status: AC

## 2010-08-10 NOTE — Patient Instructions (Signed)
Strict hand hygiene when using eye meds as discussed

## 2010-08-10 NOTE — Progress Notes (Signed)
  Subjective:    Patient ID: [name], female    DOB: Nov 05, 1975, 35 y.o.   MRN: [id number] . HPI last  night  her contact tore as  she was trying to remove it. It  was difficult to retrieve the remaining portion  left in her eye.  Afterward  she has some irritation after she did remove the final portion of the lens. She did have minor tenderness around the eye after the manipulation. This morning she's noted increased secretions from the L eye "like sleep". She denies frank purulence, pain in the eye, loss of vision, or redness.  Review of Systems     Objective:   Physical Exam on exam she is in no acute distress. The sclerae are clear with no erythema or discharge noted. There is no evidence of conjunctivitis. Extraocular motion is intact. Pupils are equal round reactive to light. Vision is grossly normal to reading the wall chart. Chest no cervical lymphadenopathy.          Assessment & Plan:  #1 scleral irritation without evidence of superimposed infection. The absence of pain medicates against a corneal ulcer.  Plan: #1 at this time I would recommend Natural Tears frequently. I will give her a prescription to employ it should there be redness,swelling , pain,purulence  or visual change.

## 2010-08-11 ENCOUNTER — Ambulatory Visit: Payer: BC Managed Care – PPO | Admitting: Internal Medicine

## 2010-09-27 NOTE — Assessment & Plan Note (Signed)
[facility]                         GASTROENTEROLOGY OFFICE NOTE   NAME:[name], [name]                     MRN:          [id number]  DATE:08/28/2007                            DOB:          08/30/1975    PROBLEM:  Abdominal pain.   Mrs. Moman has returned for reevaluation.  Since her cholecystectomy in  July 2007, she has had intermittent pain in the right upper quadrant.  She describes this as poking discomfort.  It is unrelated to eating or  bowel movements.  Pain can be fleeting.  At other times, it can be  slightly sharp.  She did have gallbladder stones and surgery.  Recent  lab work by Dr. Laury Axon, including LFTs, were normal.  She denies pyrosis.  She has a history of GERD, for which she takes Prilosec.   Other medications include Lexapro and Zyrtec p.r.n.   EXAM:  She is a healthy-appearing female.  Pulse 68, blood pressure 120/76, weight 224.  CHEST:  Clear.  No murmur, gallop, or rub.  ABDOMEN:  She has minimal tenderness in the right upper quadrant without  guarding or rebound.  There are no abdominal masses or organomegaly.   IMPRESSION:  Nonspecific abdominal pain.  I suspect this is abdominal  wall pain or perhaps pain related to adhesions.  It is a minimal  problem.   RECOMMENDATIONS:  No GI workup at this time.  She can take Advil as  needed.     Barbette Hair. Arlyce Dice, MD,FACG  Electronically Signed    RDK/MedQ  DD: 08/28/2007  DT: 08/28/2007  Job #: [id number]   cc:   Lelon Perla, DO

## 2010-09-30 NOTE — Op Note (Signed)
[place]. [facility]  Patient:    [name], [name]                       MRN: [id number] Proc. Date: 11/25/99 Adm. Date:  [id number] Attending:  Marlowe Sh[place] CC:         Artist Pais Weingold, M.D. x 2                           Operative Report  PREOPERATIVE DIAGNOSIS:  Displaced left middle finger middle phalanx.  POSTOPERATIVE DIAGNOSIS:  Displaced left middle finger middle phalanx.  PROCEDURE:  Closed reduction and percutaneous pinning of above fracture.  SURGEON:  Artist Pais. Mina Marble, M.D.  ANESTHESIA:  Monitored anesthesia care with 2% plain lidocaine and 0.25% Marcaine digital block performed by the surgeon.  COMPLICATIONS:  None.  DRAINS:  None.  DESCRIPTION OF PROCEDURE:  The patient was taken to the operating room and after the induction of adequate IV sedation, the left upper extremity was prepped and draped in the usual sterile fashion.  Once she was sedated, 2 cc of 0.25% Marcaine and 2% plain lidocaine mixture was injected into the flexor sheath of the long finger, providing adequate anesthesia.  Once this was done, the left long finger was manipulated distally.  The fracture was reduced adequately and two 0.035 K-wires were driven from the ulnar border dorsally across the fracture site toward the radial side. Intraoperative x-rays showed adequate reduction in both the AP and lateral plane as well as oblique views.  The wires were cut outside the skin, bent upon themselves, and the wound was then dressed with Xeroform, 4 x 4s, a volar splint, and Coban wrap.  The patient tolerated the procedure well and went to the recovery room in a stable condition. DD:  11/25/99 TD:  11/25/99 Job: [id number]

## 2010-09-30 NOTE — Discharge Summary (Signed)
[name], [name]              ACCOUNT NO.:  [id number]   MEDICAL RECORD NO.:  [id number]          PATIENT TYPE:  INP   LOCATION:  9146                          [facility]ILITY:  [facility]   PHYSICIAN:  Lenoard Aden, M.D.DATE OF BIRTH:  May 23, 1975   DATE OF ADMISSION:  08/10/2006  DATE OF DISCHARGE:  08/13/2006                               DISCHARGE SUMMARY   HOSPITAL COURSE:  The patient underwent uncomplicated repeat C-section.  Postoperative course uncomplicated.  Tolerated a regular diet well.  Discharged to home day #3.  Discharge teaching done.   FOLLOW UP:  Follow up in the office in 4-6 weeks.   DISCHARGE MEDICATIONS:  Tylox and prenatal vitamins given.      Lenoard Aden, M.D.  Electronically Signed     RJT/MEDQ  D:  10/21/2006  T:  10/22/2006  Job:  [id number]

## 2010-09-30 NOTE — Discharge Summary (Signed)
NAMEBRITISH, [name]                          ACCOUNT NO.:  [id number]   MEDICAL RECORD NO.:  [id number]                   PATIENT TYPE:  INP   LOCATION:  9109                                 [facility]  [facility]   PHYSICIAN:  Janine Limbo, M.D.            DATE OF BIRTH:  03-22-1976   DATE OF ADMISSION:  11/15/2003  DATE OF DISCHARGE:                                 DISCHARGE SUMMARY   ADMISSION DIAGNOSES:  1. Intrauterine pregnancy at 13 and four-sevenths weeks.  2. Chronic hypertension with superimposed preeclampsia.   DISCHARGE DIAGNOSES:  1. Intrauterine pregnancy at 24 and four-sevenths weeks.  2. Chronic hypertension with superimposed preeclampsia.  3. Oligohydramnios.  4. Nonreassuring fetal heart rate.  5. Occult cord prolapse.   HOSPITAL PROCEDURES:  1. Induction of labor.  2. Epidural anesthesia.  3. Primary low transverse cesarean section of a live female infant named [name],     Apgars 7 and 8.   HOSPITAL COURSE:  The patient was admitted with chronic hypertension with  superimposed preeclampsia.  Blood pressures on admission were 126 to 155  over 88 to 108.  The 24-hour urine showed 910 mg of protein.  Ultrasound  prior to admission showed a vertex presentation with an AFI of 11.5 and 50th  to 75th percentile for growth.  The patient was admitted and magnesium  sulfate was started for seizure prophylaxis.  Delivery plans were discussed  with the patient and they then proceed with induction of labor.  Amniocentesis had been considered but oligohydramnios was discovered and  amniocentesis was unable to be done secondary to that so decision was made  to proceed with induction of labor.  She had some hyperstimulation after  cervical ripening with Cytotec.  The patient baby tolerated this well.  She  received an epidural for pain management.  She developed some nonreassuring  fetal heart rate tracing and amnioinfusion was begun.  Late decelerations  continued despite  amnioinfusion and decision was made at that point to  proceed with a primary low transverse cesarean section which was performed  under epidural anesthesia by Dr. Su Hilt with no complications.  There was  an occult cord prolapse discovered upon entering the uterus.  EBL was 800  mL.  Cord gases were 7.34 venous and 7.26 arterial.  The patient was taken  to recovery and then to AICU for monitoring.  Blood pressures on  postoperative day #1 were 115 to 135 over 55 to 75.  Incision was intact and  diuresis began.  Hemoglobin was 8.0, platelets were 149.  SGPT was 41, SGOT  was 70 which were elevated from previous measurements.  On postoperative day  #2 the patient was transferred to mother-baby unit after demonstrating  improvement.  Blood pressures ranged from 130 to 146 over 80 to 90s.  Liver  function tests began to improve with SGOT down to 30 and SGPT down to 28.  She was  treated with Aldomet for her hypertension.  She did experience some  elevations in her blood pressure during the night of postoperative day #2  with 65-114 diastolics.  However, some of these were felt to be due to use  of a small blood pressure cuff.  On postoperative day #3 blood pressure was  153/85, weight was down to 276 which represented a 5-pound weight loss since  the previous day.  DTRs were 2+ with no clonus.  Edema had decreased to 2+  in her lower extremities.  Abdomen was soft and appropriately tender.  Incision was clean, dry, and intact with subcutaneous sutures.  Lochia was  small.  Chest was clear, heart rate was regular.  Extremity were otherwise  within normal limits.  The patient was deemed to have received the full  benefit of her hospital stay and was discharged to home.   DISCHARGE MEDICATIONS:  1. Motrin 600 mg p.o. q.6h. p.r.n.  2. Tylox one to two p.o. q.4h. p.r.n.  3. Iron supplementation b.i.d.  4. Nexium 40 mg daily.  5. Aldomet (dosage of which will be determined upon actual  discharge).   DISCHARGE LABORATORY DATA:  White blood cell count 9.5, hemoglobin 8.0,  platelets 149.  AFT 30, ALT 28, total bilirubin 0.2.   DISCHARGE INSTRUCTIONS:  Per [facility] handout.   DISCHARGE FOLLOW-UP:  In 1 week or p.r.n. at [facility] office.     Marie L. Williams, C.N.M.                 Janine Limbo, M.D.    MLW/MEDQ  D:  11/21/2003  T:  11/21/2003  Job:  [id number]

## 2010-09-30 NOTE — Op Note (Signed)
[name], Flowers              ACCOUNT NO.:  [id number]   MEDICAL RECORD NO.:  [id number]          PATIENT TYPE:  AMB   LOCATION:  DAY                          FACILITY:  [facility]   PHYSICIAN:  Anselm Pancoast. Weatherly, M.D.DATE OF BIRTH:  [birth year]   DATE OF PROCEDURE:  11/10/2005  DATE OF DISCHARGE:                                 OPERATIVE REPORT   PREOPERATIVE DIAGNOSIS:  Chronic cholecystitis with stones.   POSTOPERATIVE DIAGNOSIS:  Chronic cholecystitis with stones.   OPERATION:  Laparoscopic cholecystectomy with cholangiogram, general  anesthesia.   SURGEON:  Anselm Pancoast. Zachery Dakins, M.D.   ASSISTANT:  Leonie Man, M.D.   HISTORY:  [name] is a 35 year old female who I saw in the office  approximately a month ago when she was referred by Dr. Rana Snare from the [place]  at [place] for symptomatic gallstones.  She is about 2 years postpartum  and has had episodes of epigastric pain intermittently over this time. She  recently had a more tense episode and had an ultrasound that showed a good  size stone in her gallbladder and she was referred to our office.  She was  quite emotional during that visit but it appeared that she had had a  grandfather that died following complications of gallbladder surgery and I  think that was more the issues than really pain, etc.  She has been heavy  but has lost about 70 pounds over the past year or two and cares for her 42-  year-old child. I recommended we proceed on with an elective cholecystectomy  and she is here today for the planned procedure.  She is on an  antihypertensive medication __________ 100 mg in the evening and takes  Nexium.  She also has a history of Xanax use intermittently.  The patient is  allergic to AMOXICILLIN and SULFA and was given 400 mg of Cipro  preoperatively.   The patient was taken to the operative suite, induction of general  anesthesia, endotracheal tube into the stomach and then the abdomen was  prepped with Betadine surgical scrub and solution and draped in a sterile  manner.  A small incision was made below the umbilicus.  The fascia was  identified, picked up between two Kochers and a small opening carefully made  into the peritoneal cavity.  A pursestring suture of #0 Vicryl was placed  and then a Hasson cannula introduced.  The gallbladder was not acutely  inflamed and there were no actual acute adhesions around it.  The upper 10  mm trocar was placed after anesthetizing the fascia and two lateral 5 mm  trocars in the appropriate position by Dr. Lurene Shadow. The gallbladder was  retracted upward and outward, the peritoneum was opened, the cystic duct was  identified and encompassed and then a clip placed at the junction of the  cystic duct gallbladder junction.  A small opening was made proximally, a  Cook catheter introduced, held in place with a clip and x-ray was obtained  which showed good prompt fill of the extrahepatic biliary system and good  flow into the duodenum.  The catheter  was removed, the cystic duct was  triply clipped proximally and then divided and then the cystic artery was  identified.  This was doubly clipped proximally, singly, distally and  divided and then there was some areolar tissue that was carefully dissected  but no definite posterior branch was identified.  There was a little bit of  areolar tissue posteriorly I did place a clip on. The gallbladder was freed  from its bed.  I placed it into an EndoCatch bag so we could manipulate the  gallbladder through the fascia without enlarging it since she had a good  size single stone.  The camera was placed in the upper 10 mm trocar, the bag  containing the gallbladder was brought up and the Hasson cannula removed.  We did open the bag, release the bile and then manipulated the bag and stone  so that with only enlarging it just slightly we could bring the bag  containing the gallbladder and stone out intact.  I  then placed an  additional figure-of-eight suture of #0 Vicryl in the fascia and tied both  the pursestring and this and then anesthetized this with about 15 mL of  Marcaine with adrenaline.  The carbon dioxide was released.  There was one  single adhesion down lower in the abdomen that we released and then the 5 mm  ports were drawn under direct vision, the gallbladder fossa was dry, the  little bit of irrigating fluid had been aspirated.  The carbon dioxide was  released from the upper abdomen and the upper 10 mm trocar withdrawn under  direct vision.  The subcutaneous wounds were closed with 4-0 Vicryl, Benzoin  and Steri-Strips were placed on the skin.  The patient desires to go home  after surgery today and should be able to be released if she continues to  desire to do that.           ______________________________  Anselm Pancoast. Zachery Dakins, M.D.     WJW/MEDQ  D:  11/10/2005  T:  11/10/2005  Job:  [id number]   cc:   Loreen Freud, M.D.  [place] [place]  [place] [address]

## 2010-09-30 NOTE — Discharge Summary (Signed)
[name], [name]                          ACCOUNT NO.:  [id number]   MEDICAL RECORD NO.:  [id number]                   PATIENT TYPE:  INP   LOCATION:  9153                                 [facility]   PHYSICIAN:  Janine Limbo, M.D.            DATE OF BIRTH:  [birth year]   DATE OF ADMISSION:  11/06/2003  DATE OF DISCHARGE:  11/07/2003                                 DISCHARGE SUMMARY   ADMISSION DIAGNOSES:  1. Intrauterine pregnancy at 32 weeks.  2. Pregnancy induced hypertension.  3. Preterm cervical effacement.  4. Positive fetal fibronectin.   DISCHARGE DIAGNOSES:  1. Intrauterine pregnancy at 32 weeks.  2. Pregnancy induced hypertension.  3. Preterm cervical effacement.  4. Positive fetal fibronectin.   HOSPITAL COURSE:  [name] is a 35 year old gravida 2, para 0-0-1-0 who  presented at 60 and 2/7th weeks status post positive fetal fibronectin from  November 05, 2003.  She was evaluated at [facility] on the 23rd  with complaints of diarrhea and what the patient described as intestinal  cramping.  Her cervix was closed and long at that time.  Her pregnancy has  been followed by the [facility] and had been remarkable  for:  1. Questionable last menstrual period.  2. History of abnormal Pap.  3. GERD.  4. Asthma.  5. History of depression.  6. Obesity.  7. Pregnancy induced hypertension.   Upon admission her blood pressures ranged between 139 to 147/88 to 101.  Her  other vital signs were stable.  Fetal monitoring was reactive with uterine irritability.  Her PIH lab work  was within normal limits.  Plan of care was to place the patient on  magnesium sulfate therapy while she received her betamethasone series, as  well as a 24-hour urine for protein to be started.  By hospital day #1 the  patient was doing well, she continued to have a reactive fetal heart rate.  She denied cramping, leaking or bleeding.  She had received her  2nd  betamethasone dose.  Her 24-hour urine was completed, and sent to be  evaluated.  The patient's blood pressure diastolics remained in the 90s and  her blood pressure medication was changed from Aldomet to labetalol 100 mg  b.i.d.  She was deemed to have received the full benefit of her hospital  stay and she was discharged home.   DISCHARGE INSTRUCTIONS:  Preterm labor precautions were given as well as  signs and symptoms of preeclampsia.   DISCHARGE MEDICATIONS:  1. Prenatal vitamin 1 p.o. daily.  2. Labetalol 100 mg b.i.d.   FOLLOWUP:  Followup will occur at [facility] on November 09, 2003  including a nonstress test and an office visit. She also has an ultrasound  planned for [facility] on November 13, 2003 and her 24-hour urine will be  checked as an outpatient.     Cam Hai,  C.N.M.                     Janine Limbo, M.D.    KS/MEDQ  D:  11/07/2003  T:  11/09/2003  Job:  [id number]

## 2010-09-30 NOTE — H&P (Signed)
NAMERASHEDA, LEDGER                          ACCOUNT NO.:  [id number]   MEDICAL RECORD NO.:  [id number]                   PATIENT TYPE:  INP   LOCATION:  9176                                 [facility]   PHYSICIAN:  Osborn Coho, M.D.                DATE OF BIRTH:  12/17/1975   DATE OF ADMISSION:  11/15/2003  DATE OF DISCHARGE:                                HISTORY & PHYSICAL   HISTORY OF PRESENT ILLNESS:  [name] is a 35 year old, gravida 2, para 0-  0-1-0, who presents at 59 and 4/7ths weeks, EDD December 30, 2003 as  determined by dates and confirmed with early ultrasound at 10 weeks.  She  presents with preeclampsia diagnosed by 24-hour urine results on November 15, 2003.  She has had several admissions in the past week for [facility], and an  asthma attack.  She completed a 24-hour urine today.  Her blood pressures  have been maintained on Aldomet 500 mg q.i.d.  She had BPP evaluation today  on November 15, 2003, which found her BPP 8/8.  The fetus in cephalic  presentation with the AFI of 9.7 cm.  She does not report any headache.  She  denies any visual changes or epigastric pain.  She is not having any  contractions, cramping, or pain.  No vaginal bleeding or leaking of fluid.  Her pregnancy has been followed by the M.D. service at [facility]  It is  remarkable for (1) chronic hypertension with superimposed preeclampsia, (2)  (?) LMP, history of abnormal Pap CIN-1, (3) history of gastric reflux, (4)  asthma, (5) depression, (6) obesity.  This patient was initially evaluated  at the office of [facility] on June 04, 2003 at [redacted] weeks gestation.  EDC  determined by dates, and confirmed with early ultrasound at 10 weeks and 1  day.  Her pregnancy has been complicated by hypertension, chest pain  (cardiology and EKG consult within normal limits - chest pain at that time  indicative of worsening of gastric reflux.  Nexium has provided relief),  exacerbation of asthma, and admission for asthma  attack 1 week ago.  The  patient's hypertension increased to the point that she was begun on Aldomet  250 mg p.o. b.i.d. at 25 weeks.  Her 1-hour glucose challenge was elevated;  3-hour GTT was weight loss.  Aldomet continued to be increased.  Twenty-four  hour urine and PIH labs were begun at 29 weeks.  Antenatal testing with  NST's biweekly, and BPP's were initiated at that time.  The patient was  maintained on level II bed rest.  She was admitted following a positive  fetal fibronectin on November 06, 2003, and received betamethasone times two 24  hours apart.  Her blood pressure medications were adjusted at that time.  She was discharged home, and then readmitted with an asthma attack, and  since discharged from the hospital, she has  been followed every other day  with BPP, NST's, blood pressure, and 24-hour urine evaluation.  Her prenatal  laboratory work on May 26, 2003 found hemoglobin and hematocrit of 11.5  and 35.1, platelets 337,000, blood type and Rh O positive, antibody screen,  VDRL nonreactive, rubella immune, hepatitis B surface antigen negative, HIV  nonreactive.  Varicella titer was positive.  CF testing negative.  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  GC and Chlamydia cultures are negative at 28.  Quad screen within normal limits at 18 weeks.  One-hour glucose challenge  within normal limits.  Hemoglobin 11.7 at 28 weeks.  One-hour glucose  elevated.  Three-hour GTT within normal limits.  On November 08, 2003, the  patient had positive fetal fibronectin and positive group B strep.   MEDICAL HISTORY:  She has a history of depression   OBSTETRICAL HISTORY:  In 2001, the patient had a first trimester SAB with no  complications.  The patient has had a history of abnormal Pap smear, CIN-1,  high-risk HPV.  She has asthma and gastric reflux.  She also has a history  of depression and chronic hypertension.   SURGICAL HISTORY:  1. Surgery for 2 broken fingers in 2001.  2.  Wisdom teeth.   FAMILY HISTORY:  Father, mother, and paternal grandmother with a history of  hypertension.  Maternal grandmother with diabetes.  Maternal grandfather  with pancreatic CA.  The patient's mother with breast CA.   GENETIC HISTORY:  Unremarkable.  There is no history of familial or  chromosomal disorders, children that died in infancy, or that were born with  birth defects.   ALLERGIES:  1. AMOXICILLIN.  2. SULFA.  (She has no allergy to latex.)   SOCIAL HISTORY:  She denies the use of tobacco, alcohol, or illicit drugs.  [name] is a married 35 year old Caucasian female.  Her husband, Syann Cupples, is involved and supportive.  He works as a Engineer, structural.  The  patient is a Health visitor.  They are [employer] in their  faith.   REVIEW OF SYSTEMS:  As described above.  The patient is 9 and 4/7ths weeks  with a history of chronic hypertension and superimposed preeclampsia,  diagnosed by 24-hour urine protein.   PHYSICAL EXAMINATION:  VITAL SIGNS:  Stable.  Blood pressure on admission  was 150/101, temperature 98.3, pulse 100, respirations 20.  HEENT:  Unremarkable.  HEART:  Regular rate and rhythm.  LUNGS:  Clear.  ABDOMEN:  Gravid in its contour.  Uterine fundus is noted to extend 36 cm  above the level of the pubic symphysis.  Leopold's maneuver finds the infant  to be in the longitudinal lie, cephalic presentation.  Her ultrasound on  November 15, 2003 found the infant to be in the cephalic presentation, AFI to be  9.7 cm.  BPP 8/8.  Fetal heart rate at the present time is 150's baseline  with positive variability.  It is not reactive at the present time, though  there are no decelerations of the fetal heart rate.  The patient is not  contracting.  Abdomen is soft and nontender.  EXTREMITIES:  1+ edema.  DTR's are 1+ with no clonus.   ASSESSMENT:  1. Intrauterine pregnancy at 57 and 4/7ths weeks.  2. Chronic hypertension with superimposed  preeclampsia.  PLAN:  Admit.  Begin magnesium sulfate per orders for preeclampsia.  Options  regarding admission plan of care discussed by Dr. Su Hilt with patient.  See  noted and orders as written.     Rica Koyanagi, C.N.M.               Osborn Coho, M.D.    SDM/MEDQ  D:  11/15/2003  T:  11/16/2003  Job:  [id number]

## 2010-09-30 NOTE — H&P (Signed)
NAMESPRING, SAN                          ACCOUNT NO.:  [id number]   MEDICAL RECORD NO.:  [id number]                   PATIENT TYPE:  INP   LOCATION:  9168                                 [facility]   PHYSICIAN:  Janine Limbo, M.D.            DATE OF BIRTH:  Jul 28, 1975   DATE OF ADMISSION:  11/09/2003  DATE OF DISCHARGE:                                HISTORY & PHYSICAL   [name] is a 35 year old gravida, 2, para 0-0-1-0, who presents at 32-1/2  weeks' gestation, EDD December 30, 2003.  She presents with an asthma attack  and a severe headache and neck pain.  She was recently on November 06, 2003,  admitted for 23-hour observation for blood pressure medication control of  her pregnancy-induced hypertension.  She was discharged on November 07, 2003, on  labetalol 100 mg p.o. b.i.d.  Her 24-hour urine was complete and showed 169  mg of protein per 24 hours.  She received betamethasone 12.5 mg IM x2 24  hours apart on June 24 and November 07, 2003.  Her O2 saturation at present is  94% on O2 following an albuterol breathing treatment.  Her O2 saturation is  91% on room air; therefore, she has been admitted to [facility]  [facility] for control of her asthma.  Dr. Beaulah Dinning, her pulmonologist, was  called to manage her asthma and requested she begin on Solu-Medrol.   Please add:  Pregnancy followed by and remarkable for, her past medical  history, her past surgical history, family history, and genetic history,  also her prenatal work, please copy those from her history and physical  dated November 06, 2003, #[id number].   REVIEW OF SYSTEMS:  As described above.  The patient presents at 32-1/2  weeks' gestation with asthma attack.   PHYSICAL EXAMINATION:  VITAL SIGNS:  Her blood pressure is 160/105,  afebrile.  HEENT:  Unremarkable.  CARDIAC:  Regular rate and rhythm.  CHEST:  Lungs find diminished breath sounds in the bases bilaterally with  patient having difficulty on deep  inspiration.  ABDOMEN:  Soft and nontender.  It is gravid in its contour.  Uterine fundus  is noted to extend 34 cm above the level of the pubic symphysis.  The  baseline of the fetal heart rate is 150s with average long-term variability.  Positive accelerations and no decelerations are noted.  There are no  contractions noted on tocodynamometer.  EXTREMITIES:  No pathologic edema.   CURRENT MEDICATIONS:  1. Labetalol 100 mg p.o. b.i.d.  2. She utilizes an albuterol inhaler.  3. She takes Nexium, a prenatal vitamin, and Zyrtec daily.   ASSESSMENT:  1. Intrauterine pregnancy at 32-1/2 weeks.  2. Pregnancy-induced hypertension.  3. Asthma.   PLAN:  1. Admit per Dr. Marline Backbone with consult by Dr. Beaulah Dinning.  2. She is to have an IV of D5LR at 125 mL/hr., Solu-Medrol 40 mg IV q.6h.,  blood pressure medicine parameters     were written per Dr. Stefano Gaul.  She may have Stadol for pain IV and     continue her labetalol 100 mg p.o. b.i.d.  3. A CBC, comprehensive metabolic panel, and CCUA will be checked.   The patient is in agreement with above plan for admission.     Rica Koyanagi, C.N.M.               Janine Limbo, M.D.    SDM/MEDQ  D:  11/09/2003  T:  11/09/2003  Job:  [id number]

## 2010-09-30 NOTE — Op Note (Signed)
NAMESKYLEEN, [name]              ACCOUNT NO.:  [id number]   MEDICAL RECORD NO.:  [id number]          PATIENT TYPE:  INP   LOCATION:  9146                          [facility]   PHYSICIAN:  Richardean Sale, M.D.   DATE OF BIRTH:  01/16/1976   DATE OF PROCEDURE:  08/10/2006  DATE OF DISCHARGE:                               OPERATIVE REPORT   PREOPERATIVE DIAGNOSES:  Thirty-eight plus week intrauterine pregnancy  with spontaneous rupture of membranes, history of prior cesarean section  for repeat section.   POSTOPERATIVE DIAGNOSES:  Thirty-eight plus week intrauterine pregnancy  with spontaneous rupture of membranes, history of prior cesarean section  for repeat section.   PROCEDURE:  Repeat low transverse cesarean section.   SURGEON:  Richardean Sale, M.D.   ASSISTANT:  Sherron Monday, MD   ANESTHESIA:  Spinal.   COMPLICATIONS:  None.   ESTIMATED BLOOD LOSS:  500 mL.   OPERATIVE FINDINGS:  Viable female infant cephalic presentation, Apgars 9  and 10.  Nuchal cord X1.  Intact placenta with three-vessel cord.  Normal uterus, tubes and ovaries.   SPECIMENS:  None.   INDICATIONS:  This is a 34 year old gravida 3, para 0, 1-1-1 white  female who is at 89 plus weeks gestation by first trimester ultrasound  who had sudden onset a rupture of membranes at 11:30 p.m.  The patient  has had a history of a prior cesarean delivery and desires repeat C-  section.  She is group Beta strep positive and allergic to PENICILLIN.  Sensitivity testing showed resistance to clindamycin.  The patient has a  significant allergy to AMOXICILLIN.  She received 1 gram of vancomycin  prior to proceeding with surgery.  Prior to the procedure the risks,  benefits and alternatives of the procedure were discussed with the  patient in detail.  We discussed the risks which include but are not  limited to hemorrhage requiring transfusion, infection, injury to the  bowel or bladder or other organs which could  require additional surgery  and anesthesia related complications.  The patient voiced understanding  of the above and desires to proceed.  Informed consent was obtained and  all questions were answered before proceeding to the OR.   PROCEDURE:  The patient was taken to the operating room where she was  given a spinal anesthetic.  She was then prepped and draped in the usual  sterile fashion and placed in the supine position with leftward tilt.  A  Foley catheter was placed.  Her spinal was tested and was adequate.  10  mL of 0.5% plain Marcaine were injected into the area where the incision  was to be made and a Pfannenstiel's skin incision was then made with a  scalpel through the patient's prior incision.  This incision was then  carried down sharply to fascia.  The fascia was incised in the midline.  The fascial incision was then extended laterally with Mayo scissors.  The superior and inferior aspects of fascial incision were grasped with  Kocher clamps, elevated and the underlying rectus muscles were dissected  off with both sharp and  blunt dissection.  The muscles were then  separated in the midline.  The peritoneum was identified and entered  bluntly.  Peritoneal incision was then extended superiorly and  inferiorly with visualization of bladder.  Bladder blade was inserted.  The vesicouterine peritoneum was grasped with pickups and entered  sharply with Metzenbaum scissors.  This incision was then extended  laterally and the bladder flap was created digitally.   The bladder blade was then reinserted and the lower uterine segment was  incised in a transverse fashion with a scalpel.  This incision was then  extended manually.  The bladder blade was removed and infant's head was  delivered atraumatically.  Nose and mouth were suctioned with a bulb.  Nuchal cord times x1 was reduced.  The infant was then delivered to the  sterile field.  The cord was clamped and cut.  The infant was  handed off  to the awaiting NICU attendants.  Apgars were 9 and 10.  Birth weight 7  pounds.  The uterus was then examined.  The placenta was then delivered  intact.  The uterus was cleared of all clot debris and the uterine  incision was then repaired with a running locked chromic suture.  A  second layer of the same suture was placed in imbricating fashion for  additional hemostasis.  Once hemostasis of the uterine incision was  confirmed the pelvis was irrigated copiously with warm normal saline and  any areas of bleeding were cauterized with Bovie.  The subfascial areas,  muscle surfaces and peritoneal surfaces were all inspected and any areas  of bleeding were cauterized with Bovie.  Once hemostasis was assured,  the fascia was closed with a running Vicryl suture.  The subcutaneous  space was irrigated.  Any areas of bleeding were cauterized with a  Bovie.  Subcutaneous space was reapproximated with 2-0 plain gut suture  and the skin was closed with staples.   The patient tolerated the procedure very well.  All sponge, lap, needle  and instrument counts were correct x2.  She was taken to recovery room  awake and in stable condition.  There were no complications.      Richardean Sale, M.D.  Electronically Signed     JW/MEDQ  D:  08/10/2006  T:  08/10/2006  Job:  [id number]

## 2010-09-30 NOTE — Discharge Summary (Signed)
[name], [name]                          ACCOUNT NO.:  [id number]   MEDICAL RECORD NO.:  [id number]                   PATIENT TYPE:  INP   LOCATION:  9373                                 [facility]   PHYSICIAN:  Naima A. Dillard, M.D.              DATE OF BIRTH:  [birth year]   DATE OF ADMISSION:  11/09/2003  DATE OF DISCHARGE:  11/11/2003                                 DISCHARGE SUMMARY   ADMISSION DIAGNOSES:  1. Intrauterine pregnancy at 32.5 weeks.  2. Pregnancy-induced hypertension.  3. Asthma exacerbation.   DISCHARGE DIAGNOSES:  1. Intrauterine pregnancy at 32.5 weeks.  2. Pregnancy-induced hypertension.  3. Asthma exacerbation.   HISTORY OF PRESENT ILLNESS:  [name] is a 35 year old, gravida 2, para 0-  0-1-0, who presented at 32.[redacted] weeks gestation with an estimated date of  delivery of December 30, 2003.  She presented with an asthma attack and a  severe headache, as well as neck pain.  On November 06, 2003, she was admitted  for a 23 hour observation for blood pressure medication, control of her  pregnancy-induced hypertension.  She was discharged home on November 07, 2003 on  labetalol 100 mg p.o. b.i.d.  Her 24-hour urine was complete, and showed 169  mg of protein in a 24-hour specimen.  She received betamethasone x2 doses,  the first on November 06, 2003, and the second on November 07, 2003.  Her pregnancy  had been followed by the [facility] M.D[facility], and had been  remarkable for (1) questionable last menstrual period, (2) history of  abnormal Pap smear/CIN-1, (3) history of gastric reflux, (4) history of  asthma, (5) history of depression, (6) obesity, (7) probable chronic  hypertension.  Upon admission, the patient's O2 saturation was 94% on oxygen  following an albuterol breathing treatment with O2 saturation of 91% on room  air.  Dr. Beaulah Dinning, her pulmonologist, was called to manage her asthma, and  it was requested that she begin on Solu-Medrol 40 mg  IV q.6h., in addition  to albuterol breathing treatments.   On the evening of hospital day #0, the patient was feeling better, her  shortness of breath was improved, and she denied cough.  She denies any  preterm  labor signs or symptoms.  She was receiving oxygen by nasal cannula  at 3 liters per minute with O2 saturation of 95%.  She was taking labetalol  for her hypertension.   On hospital day #1, November 10, 2003, the patient was improving.  She did  mention feeling uncomfortable going home on labetalol because she feels that  may have precipitated her asthma attack.  Previously, the patient had not  received her maximum dose of Aldomet, and expressed an interest in trying  that medication again.  Labetalol was discontinued, and Aldomet was  restarted at 500 mg p.o. t.i.d.   The patient was off oxygen by hospital day #  2, and was using Pulmicort two  puffs twice a day.  She was also started on Zithromax on November 10, 2003 for a  respiratory infection.   On the evening of hospital day #2, which was November 11, 2003, the patient  expressed an interest to go home.  It was recommended by Dr. Normand Sloop that  she remain overnight, but the patient was teary and continued to express a  desire to be discharged from the hospital.  She was taking the maximum dose  of Aldomet, which is 750 mg q.6h.  She was also on Procardia XL 30 mg one  q.a.m.   Due to the patient's desires, she is going to be discharged home this  evening.   DISCHARGE INSTRUCTIONS:  1. Preterm labor precautions were reviewed.  2. The patient is to remain on bed rest with only getting up to the     bathroom, as well as up to eat.   FOLLOW UP:  1. She is to have a follow up appointment at [facility] on November 13, 2003, and the nursing from [place] will contact the patient     tomorrow regarding what time that appointment will be.  2. The patient is to follow up with Dr. Beaulah Dinning in 1 week after  discharge.   DISCHARGE MEDICATIONS:  1. Aldomet 750 mg q.i.d.  2. Procardia XL 30 mg one p.o. q.a.m.  3. Prenatal vitamin one p.o. daily.  4. Z-Pak to take as directed.     Cam Hai, C.N.M.                     Naima A. Normand Sloop, M.D.    KS/MEDQ  D:  11/11/2003  T:  11/11/2003  Job:  [id number]

## 2010-09-30 NOTE — H&P (Signed)
[name], BARBARO                          ACCOUNT NO.:  [id number]   MEDICAL RECORD NO.:  [id number]                   PATIENT TYPE:  MAT   LOCATION:  MATC                                 [facility]   PHYSICIAN:  Janine Limbo, M.D.            DATE OF BIRTH:  02/17/1976   DATE OF ADMISSION:  11/06/2003  DATE OF DISCHARGE:                                HISTORY & PHYSICAL   This is a 35 year old gravida 2, para 0-0-1-0 at 32-2/7 weeks who presents  status post positive fetal fibronectin which was done yesterday.  She was  seen yesterday for complaints of diarrhea and what she thought was  intestinal cramping.  Fetal fibronectin and group B strep were done just in  case it was premature labor.  Cervix was closed, long and high yesterday and  she reports today with a result of positive fetal fibronectin.   Pregnancy has been followed by Dr. Su Hilt and remarkable for:  1. Unsure LMP.  2. History of abnormal Pap.  3. GERD.  4. Asthma.  5. History of depression.  6. Obesity.  7. PIH.    OBSTETRICAL HISTORY:  Spontaneous abortion in 2001.   PAST MEDICAL HISTORY:  1. Abnormal Pap in 2003 at [facility] with high risk HPV.  2. History of yeast infections.  3. History of acid reflux.  4. History of environmental allergies and asthma.  5. The patient has a history of depression.   PAST SURGICAL HISTORY:  1. Finger surgery in 2001.  2. Wisdom teeth in 2002.   FAMILY HISTORY:  Remarkable for father, mother and grandmother with  hypertension.  Grandmother with diabetes.  Mother with breast cancer.  Grandfather with pancreatic cancer.   GENETIC HISTORY:  First cousin with scoliosis.   SOCIAL HISTORY:  The patient is married to [employer] who is involved and  supportive.  She is of the [employer].  She denies any alcohol,  tobacco or drug use.  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   PRENATAL LABS:  Hemoglobin 11.5, platelets 337, blood pressure O positive,  antibody  screen negative.  RPR nonreactive.  Rubella immune.  Hepatitis  negative.  HIV negative.  Pap test LGSIL.  Gonorrhea negative. Chlamydia  negative.  Varicella immune.  Cystic fibrosis negative.  Fetal fibronectin  positive.   OBJECTIVE:  VITAL SIGNS:  Vital signs stable.  Blood pressures are 139 to  147/88/101.  She is afebrile. O2 saturation is 97%.  HEENT:  Within normal limits.  NECK:  Thyroid normal and not enlarged.  CHEST:  Clear to auscultation.  CARDIOVASCULAR:  Heart has regular rate and rhythm.  ABDOMEN:  Gravid at 32 cm, vertex to Leopold's.  EXTREMITIES:  Trace to 1+ edema in her lower extremities.   EFM shows fetal heart rate of 140 to 150 with accelerations to 160.  Uterine  irritability is observed.  Cervix is closed, 50%, -3, with a vertex  presentation and this represents a change since yesterday.   ASSESSMENT:  1. Intrauterine pregnancy at 32-2/7 weeks.  2. Pregnancy induced hypertension.  3. Preterm cervical effacement with positive fetal fibronectin, presumed     premature labor.   PLAN:  1. Admit to antenatal per Dr. Stefano Gaul.  2. Magnesium sulfate tocolysis.  3. PIH labs.  4. Urinalysis and 24-hour urine.  Further orders to follow.     Marie L. Williams, C.N.M.                 Janine Limbo, M.D.    MLW/MEDQ  D:  11/06/2003  T:  11/06/2003  Job:  [id number]

## 2010-09-30 NOTE — Op Note (Signed)
Sabrina, ELLITHORPE                          ACCOUNT NO.:  [id number]   MEDICAL RECORD NO.:  [id number]                   PATIENT TYPE:  INP   LOCATION:  9176                                 [facility]   PHYSICIAN:  Osborn Coho, M.D.                DATE OF BIRTH:  01-03-1976   DATE OF PROCEDURE:  11/18/2003  DATE OF DISCHARGE:                                 OPERATIVE REPORT   PREOPERATIVE DIAGNOSES:  1. Intrauterine pregnancy at 34 weeks.  2. Chronic hypertension with worsening superimposed preeclampsia.  3. Oligohydramnios.  4. Nonreassuring fetal heart tracing.   POSTOPERATIVE DIAGNOSES:  1. Intrauterine pregnancy at 34 weeks.  2. Chronic hypertension with worsening superimposed preeclampsia.  3. Oligohydramnios.  4. Nonreassuring fetal heart tracing.  5. Occult cord.   PROCEDURE:  Primary low transverse cesarean section via Pfannenstiel skin  incision.   ANESTHESIA:  Epidural.   ATTENDING:  Osborn Coho, M.D.   ASSISTANT:  Concha Pyo. Duplantis, C.N.M.   FLUIDS:  1300 mL.   ESTIMATED BLOOD LOSS:  800 mL.   URINE OUTPUT:  300 mL of clear urine.   COMPLICATIONS:  Flowers.   FINDINGS:  Live female infant [name] with Apgar of 7 at 1 minute and 8 at 5  minutes, normal appearing bilateral ovaries and fallopian tubes. Cord gases,  venous 7.34 and arterial 7.26.   DESCRIPTION OF PROCEDURE:  The patient was taken to the operating room after  the risks, benefits, and alternatives were discussed with the patient. The  patient verbalized and understanding and consent signed and witnessed. The  patient was given a surgical level via the epidural and prepped and draped  in the normal sterile fashion. A Pfannenstiel skin incision was made and  carried down to the underlying layer of fascia with the knife.  The fascia  was excised bilaterally in the midline and extended bilaterally with the  Mayo scissors. The Kocher clamps were placed on the superior aspect of the  fascial incision and the rectus muscle excised with the fascia. The same was  done on the inferior aspect of the fascial incision. The rectus muscle was  separated in the midline and the peritoneum entered bluntly. The peritoneum  was extended manually and bladder blade placed. A bladder flap was created  with the Metzenbaum scissors and uterine incision was made with a scalpel  and extended bilaterally with the bandage scissors. An occult cord was noted  and infant was delivered and handed off to the waiting pediatricians after  the cord was clamped and cut. Cord gases were sent as well as cord blood.  The placenta was sent to pathology. The placenta was removed manually and  the uterus was cleared of all clots and debris. The uterus was exteriorized  and uterine incision was repaired with #0 Vicryl in a running locked  fashion. A second imbricating layer was performed. The uterus was returned  to the intraabdominal cavity and the uterine incision was noted to be  hemostatic. 3-0 chromic was used to repair the bladder flap. The  intraabdominal cavity was copiously irrigated and the gutters cleared of all  clots and debris.  The peritoneum was repaired with 3-0 chromic in a running  fashion.  The fascia was repaired with #0 Vicryl in a running fashion.  The  subcutaneous tissue was copiously irrigated and made hemostatic with the  Bovie. The subcutaneous tissue was reapproximated with #0 plain.  A  subcuticular stitch was performed using 3-0 Monocryl. Sponge, lap and needle  count was correct.  The patient tolerated the procedure well and would be  returned to the recovery room in stable condition.                                               Osborn Coho, M.D.    AR/MEDQ  D:  11/18/2003  T:  11/18/2003  Job:  [id number]

## 2010-12-10 ENCOUNTER — Other Ambulatory Visit: Payer: Self-pay | Admitting: Family Medicine

## 2010-12-12 MED ORDER — ALBUTEROL SULFATE HFA 108 (90 BASE) MCG/ACT IN AERS: INHALATION_SPRAY | RESPIRATORY_TRACT | Status: DC

## 2010-12-12 MED ORDER — LANSOPRAZOLE 30 MG PO CPDR: 30.0000 mg | DELAYED_RELEASE_CAPSULE | Freq: Every day | ORAL | Status: DC

## 2011-02-23 ENCOUNTER — Other Ambulatory Visit: Payer: Self-pay | Admitting: Obstetrics and Gynecology

## 2011-02-23 DIAGNOSIS — Z1231 Encounter for screening mammogram for malignant neoplasm of breast: Secondary | ICD-10-CM

## 2011-03-15 ENCOUNTER — Ambulatory Visit
Admission: RE | Admit: 2011-03-15 | Discharge: 2011-03-15 | Disposition: A | Discharge status: Home / Self Care | Payer: BC Managed Care – PPO | Source: Ambulatory Visit | Attending: Obstetrics and Gynecology | Admitting: Obstetrics and Gynecology

## 2011-03-15 DIAGNOSIS — Z1231 Encounter for screening mammogram for malignant neoplasm of breast: Secondary | ICD-10-CM

## 2011-06-18 ENCOUNTER — Other Ambulatory Visit: Payer: Self-pay | Admitting: Family Medicine

## 2011-08-15 ENCOUNTER — Ambulatory Visit (INDEPENDENT_AMBULATORY_CARE_PROVIDER_SITE_OTHER): Payer: BC Managed Care – PPO | Admitting: Internal Medicine

## 2011-08-15 ENCOUNTER — Encounter: Payer: Self-pay | Admitting: Internal Medicine

## 2011-08-15 VITALS — BP 132/84 | HR 115 | Temp 99.5°F | Wt 253.0 lb

## 2011-08-15 DIAGNOSIS — J329 Chronic sinusitis, unspecified: Secondary | ICD-10-CM

## 2011-08-15 MED ORDER — MOXIFLOXACIN HCL 400 MG PO TABS: 400.0000 mg | ORAL_TABLET | Freq: Every day | ORAL | Status: AC

## 2011-08-15 NOTE — Patient Instructions (Signed)
Rest, fluids , tylenol For cough, take Mucinex DM twice a day as needed ; discontinue all decongestants like Mucinex D. For congestion  continue using your nose sprays and a netti pot Take the antibiotic as prescribed  (Avelox) Call if no better in few days Call anytime if the symptoms are severe  use your inhaler as needed for wheezing and chest congestion

## 2011-08-15 NOTE — Progress Notes (Signed)
  Subjective:    Patient ID: [name], female    DOB: 01-16-1976, 36 y.o.   MRN: [id number]  HPI Acute visit Sx started ~4 days ago: cough, ear pain B, green nasal d/c, PND. Taking a OTC cough med w/ a decongestant  PMH: see problem list  Review of Systems + subjective F/C No N-V-D Mild aches No HA but facial pain slt chest congestion but no sputum production No CP SOB    Objective:   Physical Exam General -- alert, well-developed, and well-nourished. NAD  Neck --no LADs HEENT -- TMs bulge and red L>R, throat w/o redness, face symmetric and slt tender to palpation, EOMI, nose congested Lungs -- normal respiratory effort, no intercostal retractions, no accessory muscle use, and few ronchi. No increased WOB Heart-- tachycardic,  no murmur   Extremities-- no pretibial edema bilaterally         Assessment & Plan:  Sinusitis, bilateral otitis. Symptoms &  findings consistent with sinusitis and bilateral otitis. She is tachycardic but not toxic appearing. She has been taking over-the-counter decongestants which is a likely explanation of her tachycardia. Plan: Avelox (patient aware of this is expensive but she is allergic to amoxicillin and sulfa drugs) Discontinue decongestants See instructions.

## 2011-08-16 ENCOUNTER — Encounter: Payer: BC Managed Care – PPO | Admitting: Family Medicine

## 2011-12-01 ENCOUNTER — Ambulatory Visit: Admit: 2011-12-01 | Payer: Self-pay | Admitting: Obstetrics and Gynecology

## 2011-12-01 SURGERY — DILATATION AND CURETTAGE /HYSTEROSCOPY
Anesthesia: Choice

## 2012-01-04 ENCOUNTER — Encounter: Payer: Self-pay | Admitting: Family Medicine

## 2012-01-04 ENCOUNTER — Ambulatory Visit (INDEPENDENT_AMBULATORY_CARE_PROVIDER_SITE_OTHER): Payer: BC Managed Care – PPO | Admitting: Family Medicine

## 2012-01-04 VITALS — BP 118/74 | HR 91 | Temp 98.5°F | Wt 241.4 lb

## 2012-01-04 DIAGNOSIS — H109 Unspecified conjunctivitis: Secondary | ICD-10-CM

## 2012-01-04 MED ORDER — MOXIFLOXACIN HCL 0.5 % OP SOLN: 1.0000 [drp] | Freq: Three times a day (TID) | OPHTHALMIC | Status: AC

## 2012-01-04 NOTE — Patient Instructions (Signed)
Bacterial Conjunctivitis Conjunctivitis is an irritation (inflammation) of the clear membrane that covers the white part of the eye (conjunctiva). The irritation can also happen on the underside of the eyelids. Conjunctivitis makes the eye red or pink in color. This is what is commonly known as pink eye. CAUSES   Infection from a germ (bacteria) on the surface of the eye.   Infection from the irritation or injury of nearby tissues such as the eyelids or cornea.   More serious inflammation or infection on the inside of the eye.   Other eye diseases.   The use of certain eye medications.  SYMPTOMS  The normally white color of the eye or the underside of the eyelid is usually pink or red in color. The pink eye is usually associated with irritation, tearing and some sensitivity to light. Bacterial conjunctivitis is often associated with a thick, yellowish discharge from the eye. If a discharge is present, there may also be some blurred vision in the affected eye. DIAGNOSIS  Conjunctivitis is diagnosed by an eye exam. The eye specialist looks for changes in the surface tissues of the eye which take on changes that point to the specific type of conjunctivitis. A sample of any discharge may be collected on a Q-Tip (sterile swap). The sample will be sent to a lab to see whether or not the inflammation is caused by bacterial or viral infection. TREATMENT  Bacterial conjunctivitis is treated with medicines that kill germs (antibiotics). Drops are most often used. However, antibiotic ointments are available and may be preferred by some patients. Antibiotics by mouth (oral) are sometimes used. Artificial tears or eye washes may ease discomfort. HOME CARE INSTRUCTIONS   To ease discomfort, apply a cool, clean wash cloth to the eye for 10 to 20 minutes, 3 to 4 times a day.   Gently wipe away any drainage from the eye with a warm, wet washcloth or a cotton ball.   Wash your hands often with soap. Use paper  towels to dry.   Do not share towels or wash cloths. This may spread the infection.   Change or wash your pillow case every day.   You should not use eye make-up until the infection is gone.   Do not operate machinery or drive if vision is blurred.   Stop using contacts lenses. Ask your eye professional how to sterilize or replace them before using again. This depends on the type of contact lenses used.   Do not touch the edge of the eyelid with the eye drop bottle or ointment tube when applying medications to the affected eye. This will stop you from spreading the infection to the other eye or to others. Do as your caregiver tell you.  SEEK IMMEDIATE MEDICAL CARE IF:   The infection has not improved within 3 days of beginning treatment.   A yellow discharge from the eye develops.   Pain in the eye increases.   The redness is spreading.   Vision becomes blurred.   An oral temperature above 102 F (38.9 C) develops, or as your caregiver suggests.   Facial pain, redness or swelling develops.   Any problems that may be related to the prescribed medicine develops.  MAKE SURE YOU:   Understand these instructions.   Will watch your condition.   Will get help right away if you are not doing well or get worse.  Document Released: 05/01/2005 Document Revised: 04/20/2011 Document Reviewed: 12/19/2007 [employer] Patient Information 2012 [employer]. 

## 2012-01-04 NOTE — Progress Notes (Signed)
  Subjective:    [name] is a 36 y.o. female who presents for evaluation of discharge and erythema in both eyes. She has noticed the above symptoms for several days. Onset was gradual. Patient denies blurred vision, foreign body sensation, pain, photophobia and visual field deficit. There is a history of allergies, contact lens use and other family members with similar symptoms.  Pt allergies have started to act up the last 2 days-- she is taking her regularly rx meds.  The following portions of the patient's history were reviewed and updated as appropriate: allergies, current medications, past family history, past medical history, past social history, past surgical history and problem list.  Review of Systems Pertinent items are noted in HPI.   Objective:    BP 118/74  Pulse 91  Temp 98.5 F (36.9 C) (Oral)  Wt 241 lb 6.4 oz (109.498 kg)  SpO2 97%      General: alert, cooperative, appears stated age and no distress  Eyes:  positive findings: L > R  + errythema and d/c, with tearing  Vision: Not performed  Fluorescein:  not done     Assessment:    Acute conjunctivitis   Plan:    Discussed the diagnosis and proper care of conjunctivitis.  Stressed household Presenter, broadcasting. Ophthalmic drops per orders. Warm compress to eye(s). Local eye care discussed.  If no better in 3-4 days refer to opth--- if congestion worsens call office for abx

## 2012-01-05 ENCOUNTER — Other Ambulatory Visit: Payer: Self-pay | Admitting: Family Medicine

## 2012-01-05 ENCOUNTER — Encounter: Payer: Self-pay | Admitting: Family Medicine

## 2012-01-05 DIAGNOSIS — J329 Chronic sinusitis, unspecified: Secondary | ICD-10-CM

## 2012-01-05 MED ORDER — CLARITHROMYCIN ER 500 MG PO TB24: 1000.0000 mg | ORAL_TABLET | Freq: Every day | ORAL | Status: AC

## 2012-01-18 ENCOUNTER — Telehealth: Payer: Self-pay | Admitting: Family Medicine

## 2012-01-18 MED ORDER — LANSOPRAZOLE 30 MG PO CPDR: 30.0000 mg | DELAYED_RELEASE_CAPSULE | Freq: Every day | ORAL | Status: DC

## 2012-01-18 NOTE — Telephone Encounter (Signed)
Pt would like refill on Omeprazole sent to [facility] on [facility]. This is a new pharmacy for the patient.

## 2012-01-18 NOTE — Telephone Encounter (Signed)
Please advise, not on med list.  

## 2012-01-18 NOTE — Telephone Encounter (Signed)
We have prevacid on med list--make sure its omeprazole she wants ---ok to send

## 2012-01-18 NOTE — Telephone Encounter (Signed)
Verified medication with the patient. Rx faxed      KP

## 2012-01-23 ENCOUNTER — Encounter: Payer: BC Managed Care – PPO | Admitting: Family Medicine

## 2012-02-15 ENCOUNTER — Encounter: Payer: BC Managed Care – PPO | Admitting: Family Medicine

## 2012-02-27 ENCOUNTER — Telehealth: Payer: Self-pay | Admitting: Family Medicine

## 2012-02-27 DIAGNOSIS — Z1379 Encounter for other screening for genetic and chromosomal anomalies: Secondary | ICD-10-CM

## 2012-02-27 NOTE — Telephone Encounter (Signed)
Pt would like to know if she can get a special test done here with her physical for the Breast Cancer Gene. Pt stated Grandmother tested positive for this gene  Cb# [phone]

## 2012-02-29 NOTE — Telephone Encounter (Signed)
I will need to check with Dr.Lowne--CPE is scheduled for 05/23/2012   KP

## 2012-03-07 NOTE — Telephone Encounter (Signed)
We usually refer to genetic counselor and they do testing if needed.  --- they are at the cancer center

## 2012-03-08 NOTE — Telephone Encounter (Signed)
.  left message to have patient return my call.  

## 2012-03-11 NOTE — Telephone Encounter (Signed)
Discussed with patient and she voiced understanding. Referral put in        KP 

## 2012-03-13 ENCOUNTER — Telehealth: Payer: Self-pay | Admitting: Genetic Counselor

## 2012-03-13 NOTE — Telephone Encounter (Signed)
C/D 03/13/12 for appt. 05/23/12 [name] (Genetic)

## 2012-03-13 NOTE — Telephone Encounter (Signed)
Pt called in re Genetic Testing 1/09 @ 11 w/ [name].  Referring Dr. Laury Axon Welcome packet emailed

## 2012-05-22 ENCOUNTER — Encounter: Payer: Self-pay | Admitting: Family Medicine

## 2012-05-22 ENCOUNTER — Ambulatory Visit (INDEPENDENT_AMBULATORY_CARE_PROVIDER_SITE_OTHER): Payer: BC Managed Care – PPO | Admitting: Family Medicine

## 2012-05-22 VITALS — BP 114/76 | HR 97 | Temp 98.1°F | Ht 63.5 in | Wt 246.0 lb

## 2012-05-22 DIAGNOSIS — Z0000 Encounter for general adult medical examination without abnormal findings: Secondary | ICD-10-CM

## 2012-05-22 LAB — HEPATIC FUNCTION PANEL
ALT: 12 U/L (ref 0–35)
Albumin: 3.7 g/dL (ref 3.5–5.2)
Bilirubin, Direct: 0 mg/dL (ref 0.0–0.3)
Total Bilirubin: 0.6 mg/dL (ref 0.3–1.2)
Total Protein: 6.8 g/dL (ref 6.0–8.3)

## 2012-05-22 LAB — CBC WITH DIFFERENTIAL/PLATELET
Basophils Relative: 0.6 % (ref 0.0–3.0)
Eosinophils Relative: 1.4 % (ref 0.0–5.0)
HCT: 37 % (ref 36.0–46.0)
Lymphs Abs: 1.5 10*3/uL (ref 0.7–4.0)
MCHC: 34.5 g/dL (ref 30.0–36.0)
MCV: 89 fl (ref 78.0–100.0)
Monocytes Absolute: 0.4 10*3/uL (ref 0.1–1.0)
Platelets: 302 10*3/uL (ref 150.0–400.0)
WBC: 6.5 10*3/uL (ref 4.5–10.5)

## 2012-05-22 LAB — T4, FREE: Free T4: 0.75 ng/dL (ref 0.60–1.60)

## 2012-05-22 LAB — T3, FREE: T3, Free: 2.9 pg/mL (ref 2.3–4.2)

## 2012-05-22 LAB — LIPID PANEL
Cholesterol: 178 mg/dL (ref 0–200)
Triglycerides: 67 mg/dL (ref 0.0–149.0)

## 2012-05-22 LAB — BASIC METABOLIC PANEL
BUN: 11 mg/dL (ref 6–23)
CO2: 29 mEq/L (ref 19–32)
Chloride: 104 mEq/L (ref 96–112)
Potassium: 4.1 mEq/L (ref 3.5–5.1)

## 2012-05-22 NOTE — Patient Instructions (Addendum)
Preventive Care for Adults, Female A healthy lifestyle and preventive care can promote health and wellness. Preventive health guidelines for women include the following key practices.  A routine yearly physical is a good way to check with your caregiver about your health and preventive screening. It is a chance to share any concerns and updates on your health, and to receive a thorough exam.  Visit your dentist for a routine exam and preventive care every 6 months. Brush your teeth twice a day and floss once a day. Good oral hygiene prevents tooth decay and gum disease.  The frequency of eye exams is based on your age, health, family medical history, use of contact lenses, and other factors. Follow your caregiver's recommendations for frequency of eye exams.  Eat a healthy diet. Foods like vegetables, fruits, whole grains, low-fat dairy products, and lean protein foods contain the nutrients you need without too many calories. Decrease your intake of foods high in solid fats, added sugars, and salt. Eat the right amount of calories for you.Get information about a proper diet from your caregiver, if necessary.  Regular physical exercise is one of the most important things you can do for your health. Most adults should get at least 150 minutes of moderate-intensity exercise (any activity that increases your heart rate and causes you to sweat) each week. In addition, most adults need muscle-strengthening exercises on 2 or more days a week.  Maintain a healthy weight. The body mass index (BMI) is a screening tool to identify possible weight problems. It provides an estimate of body fat based on height and weight. Your caregiver can help determine your BMI, and can help you achieve or maintain a healthy weight.For adults 20 years and older:  A BMI below 18.5 is considered underweight.  A BMI of 18.5 to 24.9 is normal.  A BMI of 25 to 29.9 is considered overweight.  A BMI of 30 and above is  considered obese.  Maintain normal blood lipids and cholesterol levels by exercising and minimizing your intake of saturated fat. Eat a balanced diet with plenty of fruit and vegetables. Blood tests for lipids and cholesterol should begin at age 20 and be repeated every 5 years. If your lipid or cholesterol levels are high, you are over 50, or you are at high risk for heart disease, you may need your cholesterol levels checked more frequently.Ongoing high lipid and cholesterol levels should be treated with medicines if diet and exercise are not effective.  If you smoke, find out from your caregiver how to quit. If you do not use tobacco, do not start.  If you are pregnant, do not drink alcohol. If you are breastfeeding, be very cautious about drinking alcohol. If you are not pregnant and choose to drink alcohol, do not exceed 1 drink per day. One drink is considered to be 12 ounces (355 mL) of beer, 5 ounces (148 mL) of wine, or 1.5 ounces (44 mL) of liquor.  Avoid use of street drugs. Do not share needles with anyone. Ask for help if you need support or instructions about stopping the use of drugs.  High blood pressure causes heart disease and increases the risk of stroke. Your blood pressure should be checked at least every 1 to 2 years. Ongoing high blood pressure should be treated with medicines if weight loss and exercise are not effective.  If you are 55 to 39 years old, ask your caregiver if you should take aspirin to prevent strokes.  Diabetes   screening involves taking a blood sample to check your fasting blood sugar level. This should be done once every 3 years, after age 45, if you are within normal weight and without risk factors for diabetes. Testing should be considered at a younger age or be carried out more frequently if you are overweight and have at least 1 risk factor for diabetes.  Breast cancer screening is essential preventive care for women. You should practice "breast  self-awareness." This means understanding the normal appearance and feel of your breasts and may include breast self-examination. Any changes detected, no matter how small, should be reported to a caregiver. Women in their 20s and 30s should have a clinical breast exam (CBE) by a caregiver as part of a regular health exam every 1 to 3 years. After age 40, women should have a CBE every year. Starting at age 40, women should consider having a mammography (breast X-ray test) every year. Women who have a family history of breast cancer should talk to their caregiver about genetic screening. Women at a high risk of breast cancer should talk to their caregivers about having magnetic resonance imaging (MRI) and a mammography every year.  The Pap test is a screening test for cervical cancer. A Pap test can show cell changes on the cervix that might become cervical cancer if left untreated. A Pap test is a procedure in which cells are obtained and examined from the lower end of the uterus (cervix).  Women should have a Pap test starting at age 21.  Between ages 21 and 29, Pap tests should be repeated every 2 years.  Beginning at age 30, you should have a Pap test every 3 years as long as the past 3 Pap tests have been normal.  Some women have medical problems that increase the chance of getting cervical cancer. Talk to your caregiver about these problems. It is especially important to talk to your caregiver if a new problem develops soon after your last Pap test. In these cases, your caregiver may recommend more frequent screening and Pap tests.  The above recommendations are the same for women who have or have not gotten the vaccine for human papillomavirus (HPV).  If you had a hysterectomy for a problem that was not cancer or a condition that could lead to cancer, then you no longer need Pap tests. Even if you no longer need a Pap test, a regular exam is a good idea to make sure no other problems are  starting.  If you are between ages 65 and 70, and you have had normal Pap tests going back 10 years, you no longer need Pap tests. Even if you no longer need a Pap test, a regular exam is a good idea to make sure no other problems are starting.  If you have had past treatment for cervical cancer or a condition that could lead to cancer, you need Pap tests and screening for cancer for at least 20 years after your treatment.  If Pap tests have been discontinued, risk factors (such as a new sexual partner) need to be reassessed to determine if screening should be resumed.  The HPV test is an additional test that may be used for cervical cancer screening. The HPV test looks for the virus that can cause the cell changes on the cervix. The cells collected during the Pap test can be tested for HPV. The HPV test could be used to screen women aged 30 years and older, and should   be used in women of any age who have unclear Pap test results. After the age of 30, women should have HPV testing at the same frequency as a Pap test.  Colorectal cancer can be detected and often prevented. Most routine colorectal cancer screening begins at the age of 50 and continues through age 75. However, your caregiver may recommend screening at an earlier age if you have risk factors for colon cancer. On a yearly basis, your caregiver may provide home test kits to check for hidden blood in the stool. Use of a small camera at the end of a tube, to directly examine the colon (sigmoidoscopy or colonoscopy), can detect the earliest forms of colorectal cancer. Talk to your caregiver about this at age 50, when routine screening begins. Direct examination of the colon should be repeated every 5 to 10 years through age 75, unless early forms of pre-cancerous polyps or small growths are found.  Hepatitis C blood testing is recommended for all people born from 1945 through 1965 and any individual with known risks for hepatitis C.  Practice  safe sex. Use condoms and avoid high-risk sexual practices to reduce the spread of sexually transmitted infections (STIs). STIs include gonorrhea, chlamydia, syphilis, trichomonas, herpes, HPV, and human immunodeficiency virus (HIV). Herpes, HIV, and HPV are viral illnesses that have no cure. They can result in disability, cancer, and death. Sexually active women aged 25 and younger should be checked for chlamydia. Older women with new or multiple partners should also be tested for chlamydia. Testing for other STIs is recommended if you are sexually active and at increased risk.  Osteoporosis is a disease in which the bones lose minerals and strength with aging. This can result in serious bone fractures. The risk of osteoporosis can be identified using a bone density scan. Women ages 65 and over and women at risk for fractures or osteoporosis should discuss screening with their caregivers. Ask your caregiver whether you should take a calcium supplement or vitamin D to reduce the rate of osteoporosis.  Menopause can be associated with physical symptoms and risks. Hormone replacement therapy is available to decrease symptoms and risks. You should talk to your caregiver about whether hormone replacement therapy is right for you.  Use sunscreen with sun protection factor (SPF) of 30 or more. Apply sunscreen liberally and repeatedly throughout the day. You should seek shade when your shadow is shorter than you. Protect yourself by wearing long sleeves, pants, a wide-brimmed hat, and sunglasses year round, whenever you are outdoors.  Once a month, do a whole body skin exam, using a mirror to look at the skin on your back. Notify your caregiver of new moles, moles that have irregular borders, moles that are larger than a pencil eraser, or moles that have changed in shape or color.  Stay current with required immunizations.  Influenza. You need a dose every fall (or winter). The composition of the flu vaccine  changes each year, so being vaccinated once is not enough.  Pneumococcal polysaccharide. You need 1 to 2 doses if you smoke cigarettes or if you have certain chronic medical conditions. You need 1 dose at age 65 (or older) if you have never been vaccinated.  Tetanus, diphtheria, pertussis (Tdap, Td). Get 1 dose of Tdap vaccine if you are younger than age 65, are over 65 and have contact with an infant, are a healthcare worker, are pregnant, or simply want to be protected from whooping cough. After that, you need a Td   booster dose every 10 years. Consult your caregiver if you have not had at least 3 tetanus and diphtheria-containing shots sometime in your life or have a deep or dirty wound.  HPV. You need this vaccine if you are a woman age 26 or younger. The vaccine is given in 3 doses over 6 months.  Measles, mumps, rubella (MMR). You need at least 1 dose of MMR if you were born in 1957 or later. You may also need a second dose.  Meningococcal. If you are age 19 to 21 and a first-year college student living in a residence hall, or have one of several medical conditions, you need to get vaccinated against meningococcal disease. You may also need additional booster doses.  Zoster (shingles). If you are age 60 or older, you should get this vaccine.  Varicella (chickenpox). If you have never had chickenpox or you were vaccinated but received only 1 dose, talk to your caregiver to find out if you need this vaccine.  Hepatitis A. You need this vaccine if you have a specific risk factor for hepatitis A virus infection or you simply wish to be protected from this disease. The vaccine is usually given as 2 doses, 6 to 18 months apart.  Hepatitis B. You need this vaccine if you have a specific risk factor for hepatitis B virus infection or you simply wish to be protected from this disease. The vaccine is given in 3 doses, usually over 6 months. Preventive Services / Frequency Ages 19 to 39  Blood  pressure check.** / Every 1 to 2 years.  Lipid and cholesterol check.** / Every 5 years beginning at age 20.  Clinical breast exam.** / Every 3 years for women in their 20s and 30s.  Pap test.** / Every 2 years from ages 21 through 29. Every 3 years starting at age 30 through age 65 or 70 with a history of 3 consecutive normal Pap tests.  HPV screening.** / Every 3 years from ages 30 through ages 65 to 70 with a history of 3 consecutive normal Pap tests.  Hepatitis C blood test.** / For any individual with known risks for hepatitis C.  Skin self-exam. / Monthly.  Influenza immunization.** / Every year.  Pneumococcal polysaccharide immunization.** / 1 to 2 doses if you smoke cigarettes or if you have certain chronic medical conditions.  Tetanus, diphtheria, pertussis (Tdap, Td) immunization. / A one-time dose of Tdap vaccine. After that, you need a Td booster dose every 10 years.  HPV immunization. / 3 doses over 6 months, if you are 26 and younger.  Measles, mumps, rubella (MMR) immunization. / You need at least 1 dose of MMR if you were born in 1957 or later. You may also need a second dose.  Meningococcal immunization. / 1 dose if you are age 19 to 21 and a first-year college student living in a residence hall, or have one of several medical conditions, you need to get vaccinated against meningococcal disease. You may also need additional booster doses.  Varicella immunization.** / Consult your caregiver.  Hepatitis A immunization.** / Consult your caregiver. 2 doses, 6 to 18 months apart.  Hepatitis B immunization.** / Consult your caregiver. 3 doses usually over 6 months. Ages 40 to 64  Blood pressure check.** / Every 1 to 2 years.  Lipid and cholesterol check.** / Every 5 years beginning at age 20.  Clinical breast exam.** / Every year after age 40.  Mammogram.** / Every year beginning at age 40   and continuing for as long as you are in good health. Consult with your  caregiver.  Pap test.** / Every 3 years starting at age 30 through age 65 or 70 with a history of 3 consecutive normal Pap tests.  HPV screening.** / Every 3 years from ages 30 through ages 65 to 70 with a history of 3 consecutive normal Pap tests.  Fecal occult blood test (FOBT) of stool. / Every year beginning at age 50 and continuing until age 75. You may not need to do this test if you get a colonoscopy every 10 years.  Flexible sigmoidoscopy or colonoscopy.** / Every 5 years for a flexible sigmoidoscopy or every 10 years for a colonoscopy beginning at age 50 and continuing until age 75.  Hepatitis C blood test.** / For all people born from 1945 through 1965 and any individual with known risks for hepatitis C.  Skin self-exam. / Monthly.  Influenza immunization.** / Every year.  Pneumococcal polysaccharide immunization.** / 1 to 2 doses if you smoke cigarettes or if you have certain chronic medical conditions.  Tetanus, diphtheria, pertussis (Tdap, Td) immunization.** / A one-time dose of Tdap vaccine. After that, you need a Td booster dose every 10 years.  Measles, mumps, rubella (MMR) immunization. / You need at least 1 dose of MMR if you were born in 1957 or later. You may also need a second dose.  Varicella immunization.** / Consult your caregiver.  Meningococcal immunization.** / Consult your caregiver.  Hepatitis A immunization.** / Consult your caregiver. 2 doses, 6 to 18 months apart.  Hepatitis B immunization.** / Consult your caregiver. 3 doses, usually over 6 months. Ages 65 and over  Blood pressure check.** / Every 1 to 2 years.  Lipid and cholesterol check.** / Every 5 years beginning at age 20.  Clinical breast exam.** / Every year after age 40.  Mammogram.** / Every year beginning at age 40 and continuing for as long as you are in good health. Consult with your caregiver.  Pap test.** / Every 3 years starting at age 30 through age 65 or 70 with a 3  consecutive normal Pap tests. Testing can be stopped between 65 and 70 with 3 consecutive normal Pap tests and no abnormal Pap or HPV tests in the past 10 years.  HPV screening.** / Every 3 years from ages 30 through ages 65 or 70 with a history of 3 consecutive normal Pap tests. Testing can be stopped between 65 and 70 with 3 consecutive normal Pap tests and no abnormal Pap or HPV tests in the past 10 years.  Fecal occult blood test (FOBT) of stool. / Every year beginning at age 50 and continuing until age 75. You may not need to do this test if you get a colonoscopy every 10 years.  Flexible sigmoidoscopy or colonoscopy.** / Every 5 years for a flexible sigmoidoscopy or every 10 years for a colonoscopy beginning at age 50 and continuing until age 75.  Hepatitis C blood test.** / For all people born from 1945 through 1965 and any individual with known risks for hepatitis C.  Osteoporosis screening.** / A one-time screening for women ages 65 and over and women at risk for fractures or osteoporosis.  Skin self-exam. / Monthly.  Influenza immunization.** / Every year.  Pneumococcal polysaccharide immunization.** / 1 dose at age 65 (or older) if you have never been vaccinated.  Tetanus, diphtheria, pertussis (Tdap, Td) immunization. / A one-time dose of Tdap vaccine if you are over   65 and have contact with an infant, are a healthcare worker, or simply want to be protected from whooping cough. After that, you need a Td booster dose every 10 years.  Varicella immunization.** / Consult your caregiver.  Meningococcal immunization.** / Consult your caregiver.  Hepatitis A immunization.** / Consult your caregiver. 2 doses, 6 to 18 months apart.  Hepatitis B immunization.** / Check with your caregiver. 3 doses, usually over 6 months. ** Family history and personal history of risk and conditions may change your caregiver's recommendations. Document Released: 06/27/2001 Document Revised: 07/24/2011  Document Reviewed: 09/26/2010 [employer] Patient Information 2013 [employer].  

## 2012-05-22 NOTE — Progress Notes (Signed)
Subjective:     [name] is a 37 y.o. female and is here for a comprehensive physical exam. The patient reports no problems.  History   Social History  . Marital Status: Married    Spouse Name: N/A    Number of Children: N/A  . 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 Smoking status: Never Smoker   . Smokeless tobacco: Not on file  . Alcohol Use: Yes     Comment: rare  . Drug Use: No  . Sexually Active: Not on file   Other Topics Concern  . Not on file   Social History Narrative  . No narrative on file   Health Maintenance  Topic Date Due  . Influenza Vaccine  01/13/2013  . Tetanus/tdap  08/25/2014  . Pap Smear  09/13/2014    The following portions of the patient's history were reviewed and updated as appropriate:  She  has a past medical history of Depression and GERD (gastroesophageal reflux disease). She  does not have any pertinent problems on file. She  has past surgical history that includes Cesarean section and Cholecystectomy. Her family history includes Arthritis in her paternal grandmother; BRCA 1/2 in her cousin and paternal grandmother; Breast cancer in her mother; Cancer in her father and mother; Hypertension in her father and mother; and Pancreatic cancer in her maternal grandfather. She  reports that she has never smoked. She does not have any smokeless tobacco history on file. She reports that she drinks alcohol. She reports that she does not use illicit drugs. She has a current medication list which includes the following prescription(s): albuterol, alprazolam, azelastine, cetirizine, fluticasone, lansoprazole, and levonorgestrel. Current Outpatient Prescriptions on File Prior to Visit  Medication Sig Dispense Refill  . albuterol (PROAIR HFA) 108 (90 BASE) MCG/ACT inhaler INHALE 2 PUFFS FOUR TIMES DAILY AS NEEDED  8.5 g  1  . ALPRAZolam (XANAX) 0.5 MG tablet Take 0.5 mg by mouth 2 (two)  times daily as needed.        Marland Kitchen azelastine (ASTELIN) 137 MCG/SPRAY nasal spray Place 1 spray into the nose 2 (two) times daily. Use in each nostril as directed      . cetirizine (ZYRTEC) 10 MG tablet Take 10 mg by mouth daily. Seasonal      . fluticasone (FLONASE) 50 MCG/ACT nasal spray Place 2 sprays into the nose daily.      . lansoprazole (PREVACID) 30 MG capsule Take 1 capsule (30 mg total) by mouth daily.  90 capsule  1  . levonorgestrel (MIRENA) 20 MCG/24HR IUD 1 each by Intrauterine route once.       She is allergic to amoxicillin; labetalol hcl; and sulfonamide derivatives..  Review of Systems Review of Systems  Constitutional: Negative for activity change, appetite change and fatigue.  HENT: Negative for hearing loss, congestion, tinnitus and ear discharge.  dentist q44m Eyes: Negative for visual disturbance (see optho q1y -- vision corrected to 20/20 with glasses).  Respiratory: Negative for cough, chest tightness and shortness of breath.   Cardiovascular: Negative for chest pain, palpitations and leg swelling.  Gastrointestinal: Negative for abdominal pain, diarrhea, constipation and abdominal distention.  Genitourinary: Negative for urgency, frequency, decreased urine volume and difficulty urinating.  Musculoskeletal: Negative for back pain, arthralgias and gait problem.  Skin: Negative for color change, pallor and rash.  Neurological: Negative for dizziness, light-headedness, numbness and headaches.  Hematological: Negative for adenopathy.  Does not bruise/bleed easily.  Psychiatric/Behavioral: Negative for suicidal ideas, confusion, sleep disturbance, self-injury, dysphoric mood, decreased concentration and agitation.       Objective:    BP 114/76  Pulse 97  Temp 98.1 F (36.7 C) (Oral)  Ht 5' 3.5" (1.613 m)  Wt 246 lb (111.585 kg)  BMI 42.89 kg/m2  SpO2 98% General appearance: alert, cooperative, appears stated age and no distress Head: Normocephalic, without  obvious abnormality, atraumatic Eyes: conjunctivae/corneas clear. PERRL, EOM's intact. Fundi benign. Ears: normal TM's and external ear canals both ears Nose: Nares normal. Septum midline. Mucosa normal. No drainage or sinus tenderness. Throat: lips, mucosa, and tongue normal; teeth and gums normal Neck: no adenopathy, no carotid bruit, no JVD, supple, symmetrical, trachea midline and thyroid not enlarged, symmetric, no tenderness/mass/nodules Back: symmetric, no curvature. ROM normal. No CVA tenderness. Lungs: clear to auscultation bilaterally Breasts: normal appearance, no masses or tenderness Heart: regular rate and rhythm, S1, S2 normal, no murmur, click, rub or gallop Abdomen: soft, non-tender; bowel sounds normal; no masses,  no organomegaly Pelvic: deferred--- Extremities: extremities normal, atraumatic, no cyanosis or edema Pulses: 2+ and symmetric Skin: Skin color, texture, turgor normal. No rashes or lesions Lymph nodes: Cervical, supraclavicular, and axillary nodes normal. Neurologic: Alert and oriented X 3, normal strength and tone. Normal symmetric reflexes. Normal coordination and gait psych--no depression , no anxiety    Assessment:    Healthy female exam.      Plan:    check labs  ghm utd See After Visit Summary for Counseling Recommendations

## 2012-05-23 ENCOUNTER — Encounter: Payer: BC Managed Care – PPO | Admitting: Genetic Counselor

## 2012-05-23 ENCOUNTER — Other Ambulatory Visit: Payer: BC Managed Care – PPO | Admitting: Lab

## 2012-05-23 ENCOUNTER — Encounter: Payer: BC Managed Care – PPO | Admitting: Family Medicine

## 2012-05-24 ENCOUNTER — Encounter: Payer: Self-pay | Admitting: Family Medicine

## 2012-06-11 ENCOUNTER — Encounter: Payer: Self-pay | Admitting: Family Medicine

## 2012-06-12 ENCOUNTER — Telehealth: Payer: Self-pay

## 2012-06-12 NOTE — Telephone Encounter (Signed)
PA initiated and approved for Lansoprazole from 06/11/12 and 06/11/13, case ID [id number]      KP

## 2012-07-10 ENCOUNTER — Ambulatory Visit (INDEPENDENT_AMBULATORY_CARE_PROVIDER_SITE_OTHER): Payer: BC Managed Care – PPO | Admitting: Internal Medicine

## 2012-07-10 ENCOUNTER — Encounter: Payer: Self-pay | Admitting: Internal Medicine

## 2012-07-10 VITALS — BP 124/82 | HR 91 | Temp 98.6°F | Wt 245.0 lb

## 2012-07-10 DIAGNOSIS — J322 Chronic ethmoidal sinusitis: Secondary | ICD-10-CM

## 2012-07-10 MED ORDER — AZITHROMYCIN 250 MG PO TABS: ORAL_TABLET | ORAL | Status: DC

## 2012-07-10 NOTE — Patient Instructions (Addendum)
Continue with all your medications, use albuterol as needed for wheezing. You can also take Mucinex DM twice a day as needed for cough A low dose of a decongestant is also appropriate : Pseudoephedrine 30 mg (behind the counter) up to 3 times a day. Rest, fluids , tylenol  Call if no better in few days Call anytime if the symptoms are severe

## 2012-07-10 NOTE — Progress Notes (Signed)
  Subjective:    Patient ID: [name], female    DOB: 04-Jan-1976, 37 y.o.   MRN: [id number]  HPI Acute office visit Symptoms started 8 days ago with nose congestion, sore throat, postnasal dripping, cough. She felt that she probably had allergies but the symptoms are worse for the last 2 days --> increased congestion, yellow-thick nasal discharge, ear pressure bilaterally. She is leaving to [place] tomorrow and is concerned she is going to get worse.  Past Medical History  Diagnosis Date  . Depression   . GERD (gastroesophageal reflux disease)   . RAD (reactive airway disease)    Past Surgical History  Procedure Laterality Date  . Cesarean section    . Cholecystectomy      Review of Systems Denies fever or chills No nausea, vomiting, myalgias. She has asthma and allergies, good compliance with medications.     Objective:   Physical Exam General -- alert, well-developed   HEENT -- TMs bulge but not red, throat w/o redness, face symmetric and   tender to palpation at the maxillary and frontal sinuses. Nose congested Lungs -- normal respiratory effort, no intercostal retractions, no accessory muscle use, and normal breath sounds.   Heart-- normal rate, regular rhythm, no murmur, and no gallop.    Psych-- Cognition and judgment appear intact. Alert and cooperative with normal attention span and concentration.  not anxious appearing and not depressed appearing.        Assessment & Plan:   Sinusitis, Acute sinusitis, viral versus bacterial, symptoms getting worse. Plan: see instructions

## 2012-08-22 ENCOUNTER — Other Ambulatory Visit: Payer: Self-pay | Admitting: Family Medicine

## 2012-12-24 ENCOUNTER — Ambulatory Visit (INDEPENDENT_AMBULATORY_CARE_PROVIDER_SITE_OTHER): Payer: BC Managed Care – PPO | Admitting: Family Medicine

## 2012-12-24 ENCOUNTER — Encounter: Payer: Self-pay | Admitting: Family Medicine

## 2012-12-24 DIAGNOSIS — R109 Unspecified abdominal pain: Secondary | ICD-10-CM

## 2012-12-24 LAB — POCT URINALYSIS DIPSTICK
Protein, UA: NEGATIVE
Spec Grav, UA: <=1.005
Urobilinogen, UA: NEGATIVE

## 2012-12-24 LAB — POCT URINE PREGNANCY: Preg Test, Ur: NEGATIVE

## 2012-12-24 NOTE — Progress Notes (Signed)
  Subjective:     [name] is a 37 y.o. female who presents for evaluation of abdominal pain. Onset was a few weeks ago. Symptoms have been gradually worsening. The pain is described as dull, and is 5/10 in intensity. Pain is located in the RUQ and R flank without radiation.  Aggravating factors: none.  Alleviating factors: none. Associated symptoms: none. The patient denies na.  The patient's history has been marked as reviewed and updated as appropriate.  Review of Systems Pertinent items are noted in HPI.     Objective:    BP 116/60  Pulse 91  Temp(Src) 98.6 F (37 C) (Oral)  Wt 262 lb 6.4 oz (119.024 kg)  BMI 45.75 kg/m2  SpO2 98% General appearance: alert, cooperative, appears stated age and no distress Neck: no adenopathy, supple, symmetrical, trachea midline and thyroid not enlarged, symmetric, no tenderness/mass/nodules Lungs: clear to auscultation bilaterally Heart: S1, S2 normal Abdomen: soft, non-tender; bowel sounds normal; no masses,  no organomegaly    Assessment:    Abdominal pain ? etiology .    Plan:    The diagnosis was discussed with the patient and evaluation and treatment plans outlined. See orders for lab and imaging studies. Adhere to simple, bland diet. Further follow-up plans will be based on outcome of lab/imaging studies; see orders.

## 2012-12-24 NOTE — Patient Instructions (Signed)

## 2012-12-24 NOTE — Assessment & Plan Note (Signed)
Refer to nutrition at pt request con't diet and exercise

## 2012-12-25 LAB — CBC WITH DIFFERENTIAL/PLATELET
Basophils Relative: 0.7 % (ref 0.0–3.0)
Eosinophils Absolute: 0.2 10*3/uL (ref 0.0–0.7)
HCT: 38 % (ref 36.0–46.0)
Hemoglobin: 12.9 g/dL (ref 12.0–15.0)
MCHC: 34 g/dL (ref 30.0–36.0)
MCV: 89.3 fl (ref 78.0–100.0)
Monocytes Absolute: 0.6 10*3/uL (ref 0.1–1.0)
Neutro Abs: 5.6 10*3/uL (ref 1.4–7.7)
RBC: 4.25 Mil/uL (ref 3.87–5.11)

## 2012-12-25 LAB — BASIC METABOLIC PANEL
CO2: 27 mEq/L (ref 19–32)
Chloride: 103 mEq/L (ref 96–112)
Sodium: 137 mEq/L (ref 135–145)

## 2012-12-25 LAB — HEPATIC FUNCTION PANEL
Albumin: 4 g/dL (ref 3.5–5.2)
Alkaline Phosphatase: 62 U/L (ref 39–117)
Total Protein: 7.3 g/dL (ref 6.0–8.3)

## 2013-01-15 ENCOUNTER — Other Ambulatory Visit: Payer: Self-pay

## 2013-01-15 DIAGNOSIS — Z1231 Encounter for screening mammogram for malignant neoplasm of breast: Secondary | ICD-10-CM

## 2013-01-21 ENCOUNTER — Ambulatory Visit
Admission: RE | Admit: 2013-01-21 | Discharge: 2013-01-21 | Disposition: A | Discharge status: Home / Self Care | Payer: BC Managed Care – PPO | Source: Ambulatory Visit

## 2013-01-21 DIAGNOSIS — Z1231 Encounter for screening mammogram for malignant neoplasm of breast: Secondary | ICD-10-CM

## 2013-02-26 ENCOUNTER — Encounter: Payer: Self-pay | Admitting: Family Medicine

## 2013-03-20 ENCOUNTER — Other Ambulatory Visit: Payer: Self-pay

## 2013-03-20 ENCOUNTER — Ambulatory Visit (INDEPENDENT_AMBULATORY_CARE_PROVIDER_SITE_OTHER): Payer: BC Managed Care – PPO | Admitting: Family Medicine

## 2013-03-20 ENCOUNTER — Encounter: Payer: Self-pay | Admitting: Family Medicine

## 2013-03-20 VITALS — BP 120/74 | HR 103 | Temp 98.8°F | Wt 258.0 lb

## 2013-03-20 DIAGNOSIS — R05 Cough: Secondary | ICD-10-CM

## 2013-03-20 DIAGNOSIS — J019 Acute sinusitis, unspecified: Secondary | ICD-10-CM

## 2013-03-20 MED ORDER — CLARITHROMYCIN ER 500 MG PO TB24: 1000.0000 mg | ORAL_TABLET | Freq: Every day | ORAL | Status: AC

## 2013-03-20 MED ORDER — GUAIFENESIN-CODEINE 100-10 MG/5ML PO SYRP: ORAL_SOLUTION | ORAL | Status: DC

## 2013-03-20 NOTE — Progress Notes (Signed)
  Subjective:     [name] is a 37 y.o. female who presents for evaluation of symptoms of a URI, possible sinusitis. Symptoms include right ear pressure/pain, congestion, facial pain, nasal congestion and sinus pressure. Onset of symptoms was 2 weeks ago, and has been gradually worsening since that time. Treatment to date: antihistamines and nasal steroids.  The following portions of the patient's history were reviewed and updated as appropriate: allergies, current medications, past family history, past medical history, past social history, past surgical history and problem list.  Review of Systems Pertinent items are noted in HPI.   Objective:    BP 120/74  Pulse 103  Temp(Src) 98.8 F (37.1 C) (Oral)  Wt 258 lb (117.028 kg)  SpO2 97% General appearance: alert, cooperative, appears stated age and no distress Ears: abnormal TM right ear - erythematous and dull Nose: green discharge, moderate congestion, turbinates red, swollen, sinus tenderness bilateral Throat: abnormal findings: mild oropharyngeal erythema and + PND Neck: moderate anterior cervical adenopathy, supple, symmetrical, trachea midline and thyroid not enlarged, symmetric, no tenderness/mass/nodules Lungs: clear to auscultation bilaterally Heart: S1, S2 normal   Assessment:    otitis media and sinusitis   Plan:    Discussed the diagnosis and treatment of sinusitis. Suggested symptomatic OTC remedies. Nasal saline spray for congestion. biaxin per orders. Follow up as needed. nasal steroid per med list

## 2013-03-20 NOTE — Patient Instructions (Signed)

## 2013-04-24 ENCOUNTER — Encounter: Payer: Self-pay | Admitting: Family Medicine

## 2013-04-24 ENCOUNTER — Ambulatory Visit (INDEPENDENT_AMBULATORY_CARE_PROVIDER_SITE_OTHER): Payer: BC Managed Care – PPO | Admitting: Family Medicine

## 2013-04-24 ENCOUNTER — Encounter: Payer: Self-pay | Admitting: Gastroenterology

## 2013-04-24 VITALS — BP 136/84 | HR 95 | Temp 98.3°F | Wt 262.2 lb

## 2013-04-24 DIAGNOSIS — Z23 Encounter for immunization: Secondary | ICD-10-CM

## 2013-04-24 DIAGNOSIS — R1013 Epigastric pain: Secondary | ICD-10-CM

## 2013-04-24 DIAGNOSIS — R1012 Left upper quadrant pain: Secondary | ICD-10-CM

## 2013-04-24 DIAGNOSIS — K3189 Other diseases of stomach and duodenum: Secondary | ICD-10-CM

## 2013-04-24 LAB — AMYLASE: Amylase: 64 U/L (ref 27–131)

## 2013-04-24 LAB — HEPATIC FUNCTION PANEL
Albumin: 4.4 g/dL (ref 3.5–5.2)
Total Protein: 7.8 g/dL (ref 6.0–8.3)

## 2013-04-24 LAB — CBC WITH DIFFERENTIAL/PLATELET
Basophils Absolute: 0 10*3/uL (ref 0.0–0.1)
Eosinophils Absolute: 0.1 10*3/uL (ref 0.0–0.7)
HCT: 39.2 % (ref 36.0–46.0)
Lymphs Abs: 2.2 10*3/uL (ref 0.7–4.0)
MCHC: 33.9 g/dL (ref 30.0–36.0)
MCV: 87.8 fl (ref 78.0–100.0)
Monocytes Absolute: 0.5 10*3/uL (ref 0.1–1.0)
Platelets: 325 10*3/uL (ref 150.0–400.0)
RDW: 13.5 % (ref 11.5–14.6)

## 2013-04-24 LAB — BASIC METABOLIC PANEL
BUN: 10 mg/dL (ref 6–23)
CO2: 27 mEq/L (ref 19–32)
Chloride: 103 mEq/L (ref 96–112)
GFR: 69.27 mL/min (ref >60.00)
Glucose, Bld: 95 mg/dL (ref 70–99)
Potassium: 4.6 mEq/L (ref 3.5–5.1)

## 2013-04-24 LAB — H. PYLORI ANTIBODY, IGG: H Pylori IgG: NEGATIVE

## 2013-04-24 MED ORDER — GI COCKTAIL ~~LOC~~
30.0000 mL | Freq: Once | ORAL | Status: AC
  Administered 2013-04-24: 30 mL via ORAL

## 2013-04-24 NOTE — Patient Instructions (Signed)

## 2013-04-24 NOTE — Progress Notes (Signed)
  Subjective:     [name] is a 37 y.o. female who presents for evaluation of abdominal pain. Onset was several weeks ago. Symptoms have been unchanged. The pain is described as aching, burning and cramping.   Pain is located in the RUQ, epigastric region and suprapubic region without radiation.  Aggravating factors: eating.  Alleviating factors: antacids and GI cocktail. Associated symptoms: belching, diarrhea . The patient denies chills, constipation, dysuria, fever, flatus, frequency, headache, hematochezia, hematuria, melena, myalgias and vomiting.  The patient's history has been marked as reviewed and updated as appropriate.  Review of Systems Pertinent items are noted in HPI.     Objective:    BP 136/84  Pulse 95  Temp(Src) 98.3 F (36.8 C) (Oral)  Wt 262 lb 3.2 oz (118.933 kg)  SpO2 96% General appearance: alert, cooperative, appears stated age and no distress Head: Normocephalic, without obvious abnormality, atraumatic Lungs: clear to auscultation bilaterally Heart: S1, S2 normal Abdomen: abnormal findings:  mild tenderness in the epigastrium, in the RUQ and in the lower abdomen    Assessment:    Abdominal pain---? etiology .    Plan:  Some relief with GI cocktail  See orders for lab and imaging studies. Initiate empiric trial of acid suppression; see orders. Further follow-up plans will be based on outcome of lab/imaging studies; see orders. refer to GI

## 2013-04-24 NOTE — Progress Notes (Signed)
Pre visit review using our clinic review tool, if applicable. No additional management support is needed unless otherwise documented below in the visit note. 

## 2013-04-25 ENCOUNTER — Ambulatory Visit (HOSPITAL_BASED_OUTPATIENT_CLINIC_OR_DEPARTMENT_OTHER)
Admission: RE | Admit: 2013-04-25 | Discharge: 2013-04-25 | Disposition: A | Discharge status: Home / Self Care | Payer: BC Managed Care – PPO | Source: Ambulatory Visit | Attending: Family Medicine | Admitting: Family Medicine

## 2013-04-25 DIAGNOSIS — R1012 Left upper quadrant pain: Secondary | ICD-10-CM

## 2013-04-25 DIAGNOSIS — Z9089 Acquired absence of other organs: Secondary | ICD-10-CM | POA: Insufficient documentation

## 2013-04-25 DIAGNOSIS — R109 Unspecified abdominal pain: Secondary | ICD-10-CM | POA: Insufficient documentation

## 2013-06-02 ENCOUNTER — Ambulatory Visit (INDEPENDENT_AMBULATORY_CARE_PROVIDER_SITE_OTHER): Payer: BC Managed Care – PPO | Admitting: Gastroenterology

## 2013-06-02 ENCOUNTER — Encounter: Payer: Self-pay | Admitting: Gastroenterology

## 2013-06-02 VITALS — BP 142/100 | HR 92 | Ht 63.5 in | Wt 260.2 lb

## 2013-06-02 DIAGNOSIS — F419 Anxiety disorder, unspecified: Secondary | ICD-10-CM | POA: Insufficient documentation

## 2013-06-02 DIAGNOSIS — F411 Generalized anxiety disorder: Secondary | ICD-10-CM | POA: Insufficient documentation

## 2013-06-02 DIAGNOSIS — K3189 Other diseases of stomach and duodenum: Secondary | ICD-10-CM

## 2013-06-02 DIAGNOSIS — K219 Gastro-esophageal reflux disease without esophagitis: Secondary | ICD-10-CM

## 2013-06-02 DIAGNOSIS — R1013 Epigastric pain: Principal | ICD-10-CM

## 2013-06-02 MED ORDER — DEXLANSOPRAZOLE 60 MG PO CPDR: 60.0000 mg | DELAYED_RELEASE_CAPSULE | Freq: Every day | ORAL | Status: DC

## 2013-06-02 MED ORDER — HYOSCYAMINE SULFATE 0.125 MG SL SUBL: 0.2500 mg | SUBLINGUAL_TABLET | SUBLINGUAL | Status: DC | PRN

## 2013-06-02 NOTE — Assessment & Plan Note (Signed)
Plan to continue dexilant 60 mg daily

## 2013-06-02 NOTE — Assessment & Plan Note (Signed)
Patient has mild dyspepsia which may, in part, be related to GERD.  I suspect that anxiety is contributing as well.  There is no evidence for biliary tract disease.  I doubt there is serious underlying GI pathology.  Recommendations #1 symmetric on as needed #2 hyomax as needed #3 continue dexilant 60 mg daily

## 2013-06-02 NOTE — Assessment & Plan Note (Signed)
The patient is under the care of a psychiatrist.  I recommended that she followup with psychiatry because of issues of ongoing anxiety that are contributing to her GI symptoms.

## 2013-06-02 NOTE — Progress Notes (Signed)
_                                                                                                                History of Present Illness: 38 year old white female referred at the request of Dr. Etter Sjogren for evaluation of chest and abdominal discomfort.  Following a course of antibiotics in November, 2014 she developed chest pressure and excess belching.  Pressure was relieved by belching.  She initially took Prevacid and then was switched to dexilant with moderate relief.  At this point she rarely has pressure-like discomfort in her chest and abdomen but still has excess belching and occasional  postprandial fullness.  She denies pyrosis, nausea or vomiting, or dysphagia.  She's on no regular gastric irritants including nonsteroidals.  She status post cholecystectomy.  Recent ultrasound was negative as were her liver tests.    Past Medical History  Diagnosis Date  . Depression   . GERD (gastroesophageal reflux disease)   . RAD (reactive airway disease)   . Asthma   . Anxiety   . Gallstones    Past Surgical History  Procedure Laterality Date  . Cesarean section    . Cholecystectomy  2007   family history includes Arthritis in her paternal grandmother; BRCA 1/2 in her cousin and paternal grandmother; Breast cancer in her mother; Diabetes in her maternal grandmother; Hypertension in her father and mother; Pancreatic cancer in her maternal grandfather; Skin cancer in her father and mother. Current Outpatient Prescriptions  Medication Sig Dispense Refill  . albuterol (PROAIR HFA) 108 (90 BASE) MCG/ACT inhaler INHALE 2 PUFFS FOUR TIMES DAILY AS NEEDED  8.5 g  1  . ALPRAZolam (XANAX) 0.5 MG tablet Take 0.5 mg by mouth 2 (two) times daily as needed.        Marland Kitchen azelastine (ASTELIN) 137 MCG/SPRAY nasal spray Place 1 spray into the nose 2 (two) times daily. Use in each nostril as directed      . cetirizine (ZYRTEC) 10 MG tablet Take 10 mg by mouth daily. Seasonal      .  dexlansoprazole (DEXILANT) 60 MG capsule Take 60 mg by mouth daily.      . fluticasone (FLONASE) 50 MCG/ACT nasal spray Place 2 sprays into the nose daily.      Marland Kitchen levonorgestrel (MIRENA) 20 MCG/24HR IUD 1 each by Intrauterine route once.      . Vilazodone HCl (VIIBRYD) 40 MG TABS Take 40 mg by mouth daily.       No current facility-administered medications for this visit.   Allergies as of 06/02/2013 - Review Complete 06/02/2013  Allergen Reaction Noted  . Amoxicillin    . Labetalol hcl    . Sulfonamide derivatives      reports that she has never smoked. She has never used smokeless tobacco. She reports that she drinks alcohol. She reports that she does not use illicit drugs.     Review of Systems: She complains of frequent anxiety Pertinent positive and negative review of systems were noted in the  above HPI section. All other review of systems were otherwise negative.  Vital signs were reviewed in today's medical record Physical Exam: General: Obese female in no acute distress Skin: anicteric Head: Normocephalic and atraumatic Eyes:  sclerae anicteric, EOMI Ears: Normal auditory acuity Mouth: No deformity or lesions Neck: Supple, no masses or thyromegaly Lungs: Clear throughout to auscultation Heart: Regular rate and rhythm; no murmurs, rubs or bruits Abdomen: Soft, non tender and non distended. No masses, hepatosplenomegaly or hernias noted. Normal Bowel sounds Rectal:deferred Musculoskeletal: Symmetrical with no gross deformities  Skin: No lesions on visible extremities Pulses:  Normal pulses noted Extremities: No clubbing, cyanosis, edema or deformities noted Neurological: Alert oriented x 4, grossly nonfocal Cervical Nodes:  No significant cervical adenopathy Inguinal Nodes: No significant inguinal adenopathy Psychological:  Alert and cooperative. Normal mood and affect  See Assessment and Plan under Problem List

## 2013-06-02 NOTE — Patient Instructions (Signed)
We have sent the following medications to your pharmacy for you to pick up at your convenience:  Simethicon and Hyomax  Take both as directed

## 2013-06-10 ENCOUNTER — Telehealth: Payer: Self-pay | Admitting: Gastroenterology

## 2013-06-17 ENCOUNTER — Telehealth: Payer: Self-pay | Admitting: Gastroenterology

## 2013-06-17 NOTE — Telephone Encounter (Signed)
approved

## 2013-06-17 NOTE — Telephone Encounter (Signed)
Did prior auth on Dexilant - through [facility]

## 2013-08-31 ENCOUNTER — Other Ambulatory Visit: Payer: Self-pay | Admitting: Gastroenterology

## 2013-10-15 ENCOUNTER — Encounter: Payer: Self-pay | Admitting: Gastroenterology

## 2013-10-16 ENCOUNTER — Other Ambulatory Visit: Payer: Self-pay | Admitting: *Deleted

## 2013-10-16 MED ORDER — DEXLANSOPRAZOLE 60 MG PO CPDR: DELAYED_RELEASE_CAPSULE | ORAL | Status: DC

## 2013-12-05 ENCOUNTER — Encounter: Payer: Self-pay | Admitting: Family Medicine

## 2014-02-07 ENCOUNTER — Other Ambulatory Visit: Payer: Self-pay | Admitting: Gastroenterology

## 2014-03-27 ENCOUNTER — Encounter: Payer: Self-pay | Admitting: Family Medicine

## 2014-03-27 ENCOUNTER — Encounter: Payer: Self-pay | Admitting: Medical

## 2014-03-27 ENCOUNTER — Ambulatory Visit (INDEPENDENT_AMBULATORY_CARE_PROVIDER_SITE_OTHER): Payer: BC Managed Care – PPO | Admitting: Medical

## 2014-03-27 VITALS — BP 114/73 | HR 89 | Temp 98.3°F | Ht 63.0 in | Wt 259.6 lb

## 2014-03-27 DIAGNOSIS — H9203 Otalgia, bilateral: Secondary | ICD-10-CM

## 2014-03-27 DIAGNOSIS — H9209 Otalgia, unspecified ear: Secondary | ICD-10-CM | POA: Insufficient documentation

## 2014-03-27 DIAGNOSIS — H109 Unspecified conjunctivitis: Secondary | ICD-10-CM

## 2014-03-27 MED ORDER — AZITHROMYCIN 250 MG PO TABS: ORAL_TABLET | ORAL | Status: DC

## 2014-03-27 MED ORDER — TOBRAMYCIN 0.3 % OP SOLN: 2.0000 [drp] | OPHTHALMIC | Status: DC

## 2014-03-27 NOTE — Patient Instructions (Signed)
Your recent chronic intermittent eye discharge and matting is likely bacterial conjunctivitis. I am prescribing tobrex eye drops.  For your ear pressure and dull appearance of tympanic membranes, I am making cefdinir antibiotic available if your ear pressure worsens/you start with pain.  You report history of OM and I want you to have the antibiotic available over the weekend if your worsen. Currently continue current allergy meds.  Follow up in 7 days or as needed.

## 2014-03-27 NOTE — Progress Notes (Signed)
Subjective:    Patient ID: [name], female    DOB: [birth year], 38 y.o.   MRN: [id number]  HPI   Pt in with some eye irritation for couple of weeks. Pt had viral conjunctivitis 2 years ago and she thought maybe that was occuring again.Pt states intermittent matting and lids sticking together for both eyes. Lt eye worse than rt. She would get better and start wearing contacts again and then worsen again. She is wearing glasses today. Pt children 56 yo and 24 yo now. Pt is Glass blower/designer.   Pt has some ear pressure and nasal congestion. Pt is on zyrtec, astelin and flonase. Pt is prone to ear infections.  Last time she had matting to eyes was this am.  LMP- uses iud.  Past Medical History  Diagnosis Date  . Depression   . GERD (gastroesophageal reflux disease)   . RAD (reactive airway disease)   . Asthma   . Anxiety   . Gallstones     History   Social History  . Marital Status: Married    Spouse Name: N/A    Number of Children: 2  . 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 Smoking status: Never Smoker   . Smokeless tobacco: Never Used  . Alcohol Use: Yes     Comment: rare  . Drug Use: No  . Sexual Activity: Not on file   Other Topics Concern  . Not on file   Social History Narrative    Past Surgical History  Procedure Laterality Date  . Cesarean section    . Cholecystectomy  2007    Family History  Problem Relation Age of Onset  . Breast cancer Mother   . Hypertension Mother   . Skin cancer Mother   . Pancreatic cancer Maternal Grandfather   . Arthritis Paternal Grandmother   . BRCA 1/2 Paternal Grandmother   . Hypertension Father   . Skin cancer Father   . BRCA 1/2 Cousin   . Diabetes Maternal Grandmother     Allergies  Allergen Reactions  . Amoxicillin   . Labetalol Hcl   . Sulfonamide Derivatives     Current Outpatient Prescriptions on File Prior to Visit  Medication  Sig Dispense Refill  . albuterol (PROAIR HFA) 108 (90 BASE) MCG/ACT inhaler INHALE 2 PUFFS FOUR TIMES DAILY AS NEEDED 8.5 g 1  . ALPRAZolam (XANAX) 0.5 MG tablet Take 0.5 mg by mouth 2 (two) times daily as needed.      Marland Kitchen azelastine (ASTELIN) 137 MCG/SPRAY nasal spray Place 1 spray into the nose 2 (two) times daily. Use in each nostril as directed    . cetirizine (ZYRTEC) 10 MG tablet Take 10 mg by mouth daily. Seasonal    . DEXILANT 60 MG capsule TAKE ONE CAPSULE BY MOUTH DAILY 30 capsule 2  . fluticasone (FLONASE) 50 MCG/ACT nasal spray Place 2 sprays into the nose daily.    Marland Kitchen levonorgestrel (MIRENA) 20 MCG/24HR IUD 1 each by Intrauterine route once.    . Vilazodone HCl (VIIBRYD) 40 MG TABS Take 40 mg by mouth daily.    . hyoscyamine (LEVSIN SL) 0.125 MG SL tablet Place 2 tablets (0.25 mg total) under the tongue every 4 (four) hours as needed. 30 tablet 1   No current facility-administered medications on file prior to visit.    BP 114/73 mmHg  Pulse 89  Temp(Src) 98.3 F (36.8 C) (  Oral)  Ht _0  (1.6 m)  Wt 259 lb 9.6 oz (117.754 kg)  BMI 46.00 kg/m2  SpO2 97%   Review of Systems  Constitutional: Negative for fever, chills and fatigue.  HENT: Positive for congestion and ear pain. Negative for hearing loss, mouth sores, nosebleeds, postnasal drip, rhinorrhea, sinus pressure, sore throat, tinnitus and trouble swallowing.        Mild ear pressure.  Eyes:       Eye matting and dc in the am both eyes.  Respiratory: Negative for cough, choking, chest tightness and wheezing.   Cardiovascular: Negative for chest pain and palpitations.  Gastrointestinal: Negative.   Genitourinary: Negative.   Musculoskeletal: Negative.   Neurological: Negative.   Hematological: Negative for adenopathy. Does not bruise/bleed easily.       Objective:   Physical Exam   General  Mental Status - Alert. General Appearance - Well groomed. Not in acute distress.  Skin Rashes- No  Rashes.  HEENT Head- Normal. Ear Auditory Canal - Left- Normal. Right - Normal.Tympanic Membrane- Left- dull mild pink tim Right- Dull mild pink tm Eye Sclera/Conjunctiva- Left- Normal. Right- Normal. No matting presently Nose & Sinuses Nasal Mucosa- Left-  Boggy + Congested. Right-  Boggy + Congested. No sinus pressure Mouth & Throat Lips: Upper Lip- Normal: no dryness, cracking, pallor, cyanosis, or vesicular eruption. Lower Lip-Normal: no dryness, cracking, pallor, cyanosis or vesicular eruption. Buccal Mucosa- Bilateral- No Aphthous ulcers. Oropharynx- No Discharge or Erythema. Tonsils: Characteristics- Bilateral- No Erythema or Congestion. Size/Enlargement- Bilateral- No enlargement. Discharge- bilateral-None.  Neck Neck- Supple. No Masses.   Chest and Lung Exam Auscultation: Breath Sounds:-Normal. CTA  Cardiovascular Auscultation:Rythm- Regular.  Murmurs & Other Heart Sounds:Ausculatation of the heart reveal- No Murmurs.  Lymphatic Head & Neck General Head & Neck Lymphatics: Bilateral: Description- No Localized lymphadenopathy.         Assessment & Plan:

## 2014-03-27 NOTE — Assessment & Plan Note (Signed)
By description likely bacterial. Rx tobrex.

## 2014-03-27 NOTE — Assessment & Plan Note (Signed)
Allergy and eustachian tube dysfunction related. Use allergy meds. If worsens then start zpack.

## 2014-03-27 NOTE — Progress Notes (Signed)
Pre visit review using our clinic review tool, if applicable. No additional management support is needed unless otherwise documented below in the visit note. 

## 2014-05-11 ENCOUNTER — Other Ambulatory Visit: Payer: Self-pay | Admitting: Gastroenterology

## 2014-05-11 ENCOUNTER — Encounter: Payer: Self-pay | Admitting: Family Medicine

## 2014-05-12 MED ORDER — DEXLANSOPRAZOLE 60 MG PO CPDR: 1.0000 | DELAYED_RELEASE_CAPSULE | Freq: Every day | ORAL | Status: DC

## 2014-06-01 ENCOUNTER — Encounter: Payer: Self-pay | Admitting: Family Medicine

## 2014-06-01 ENCOUNTER — Ambulatory Visit (INDEPENDENT_AMBULATORY_CARE_PROVIDER_SITE_OTHER): Payer: BC Managed Care – PPO | Admitting: Family Medicine

## 2014-06-01 VITALS — BP 114/72 | HR 98 | Temp 98.3°F | Ht 63.0 in | Wt 263.8 lb

## 2014-06-01 DIAGNOSIS — Z0000 Encounter for general adult medical examination without abnormal findings: Secondary | ICD-10-CM

## 2014-06-01 DIAGNOSIS — R0789 Other chest pain: Secondary | ICD-10-CM

## 2014-06-01 DIAGNOSIS — Z1231 Encounter for screening mammogram for malignant neoplasm of breast: Secondary | ICD-10-CM

## 2014-06-01 DIAGNOSIS — K219 Gastro-esophageal reflux disease without esophagitis: Secondary | ICD-10-CM

## 2014-06-01 DIAGNOSIS — Z23 Encounter for immunization: Secondary | ICD-10-CM

## 2014-06-01 MED ORDER — DEXLANSOPRAZOLE 60 MG PO CPDR: 1.0000 | DELAYED_RELEASE_CAPSULE | Freq: Every day | ORAL | Status: DC

## 2014-06-01 NOTE — Patient Instructions (Signed)
Preventive Care for Adults A healthy lifestyle and preventive care can promote health and wellness. Preventive health guidelines for women include the following key practices.  A routine yearly physical is a good way to check with your health care provider about your health and preventive screening. It is a chance to share any concerns and updates on your health and to receive a thorough exam.  Visit your dentist for a routine exam and preventive care every 6 months. Brush your teeth twice a day and floss once a day. Good oral hygiene prevents tooth decay and gum disease.  The frequency of eye exams is based on your age, health, family medical history, use of contact lenses, and other factors. Follow your health care provider's recommendations for frequency of eye exams.  Eat a healthy diet. Foods like vegetables, fruits, whole grains, low-fat dairy products, and lean protein foods contain the nutrients you need without too many calories. Decrease your intake of foods high in solid fats, added sugars, and salt. Eat the right amount of calories for you.Get information about a proper diet from your health care provider, if necessary.  Regular physical exercise is one of the most important things you can do for your health. Most adults should get at least 150 minutes of moderate-intensity exercise (any activity that increases your heart rate and causes you to sweat) each week. In addition, most adults need muscle-strengthening exercises on 2 or more days a week.  Maintain a healthy weight. The body mass index (BMI) is a screening tool to identify possible weight problems. It provides an estimate of body fat based on height and weight. Your health care provider can find your BMI and can help you achieve or maintain a healthy weight.For adults 20 years and older:  A BMI below 18.5 is considered underweight.  A BMI of 18.5 to 24.9 is normal.  A BMI of 25 to 29.9 is considered overweight.  A BMI of  30 and above is considered obese.  Maintain normal blood lipids and cholesterol levels by exercising and minimizing your intake of saturated fat. Eat a balanced diet with plenty of fruit and vegetables. Blood tests for lipids and cholesterol should begin at age 76 and be repeated every 5 years. If your lipid or cholesterol levels are high, you are over 50, or you are at high risk for heart disease, you may need your cholesterol levels checked more frequently.Ongoing high lipid and cholesterol levels should be treated with medicines if diet and exercise are not working.  If you smoke, find out from your health care provider how to quit. If you do not use tobacco, do not start.  Lung cancer screening is recommended for adults aged 22-80 years who are at high risk for developing lung cancer because of a history of smoking. A yearly low-dose CT scan of the lungs is recommended for people who have at least a 30-pack-year history of smoking and are a current smoker or have quit within the past 15 years. A pack year of smoking is smoking an average of 1 pack of cigarettes a day for 1 year (for example: 1 pack a day for 30 years or 2 packs a day for 15 years). Yearly screening should continue until the smoker has stopped smoking for at least 15 years. Yearly screening should be stopped for people who develop a health problem that would prevent them from having lung cancer treatment.  If you are pregnant, do not drink alcohol. If you are breastfeeding,  be very cautious about drinking alcohol. If you are not pregnant and choose to drink alcohol, do not have more than 1 drink per day. One drink is considered to be 12 ounces (355 mL) of beer, 5 ounces (148 mL) of wine, or 1.5 ounces (44 mL) of liquor.  Avoid use of street drugs. Do not share needles with anyone. Ask for help if you need support or instructions about stopping the use of drugs.  High blood pressure causes heart disease and increases the risk of  stroke. Your blood pressure should be checked at least every 1 to 2 years. Ongoing high blood pressure should be treated with medicines if weight loss and exercise do not work.  If you are 3-86 years old, ask your health care provider if you should take aspirin to prevent strokes.  Diabetes screening involves taking a blood sample to check your fasting blood sugar level. This should be done once every 3 years, after age 67, if you are within normal weight and without risk factors for diabetes. Testing should be considered at a younger age or be carried out more frequently if you are overweight and have at least 1 risk factor for diabetes.  Breast cancer screening is essential preventive care for women. You should practice "breast self-awareness." This means understanding the normal appearance and feel of your breasts and may include breast self-examination. Any changes detected, no matter how small, should be reported to a health care provider. Women in their 8s and 30s should have a clinical breast exam (CBE) by a health care provider as part of a regular health exam every 1 to 3 years. After age 70, women should have a CBE every year. Starting at age 25, women should consider having a mammogram (breast X-ray test) every year. Women who have a family history of breast cancer should talk to their health care provider about genetic screening. Women at a high risk of breast cancer should talk to their health care providers about having an MRI and a mammogram every year.  Breast cancer gene (BRCA)-related cancer risk assessment is recommended for women who have family members with BRCA-related cancers. BRCA-related cancers include breast, ovarian, tubal, and peritoneal cancers. Having family members with these cancers may be associated with an increased risk for harmful changes (mutations) in the breast cancer genes BRCA1 and BRCA2. Results of the assessment will determine the need for genetic counseling and  BRCA1 and BRCA2 testing.  Routine pelvic exams to screen for cancer are no longer recommended for nonpregnant women who are considered low risk for cancer of the pelvic organs (ovaries, uterus, and vagina) and who do not have symptoms. Ask your health care provider if a screening pelvic exam is right for you.  If you have had past treatment for cervical cancer or a condition that could lead to cancer, you need Pap tests and screening for cancer for at least 20 years after your treatment. If Pap tests have been discontinued, your risk factors (such as having a new sexual partner) need to be reassessed to determine if screening should be resumed. Some women have medical problems that increase the chance of getting cervical cancer. In these cases, your health care provider may recommend more frequent screening and Pap tests.  The HPV test is an additional test that may be used for cervical cancer screening. The HPV test looks for the virus that can cause the cell changes on the cervix. The cells collected during the Pap test can be  tested for HPV. The HPV test could be used to screen women aged 30 years and older, and should be used in women of any age who have unclear Pap test results. After the age of 30, women should have HPV testing at the same frequency as a Pap test.  Colorectal cancer can be detected and often prevented. Most routine colorectal cancer screening begins at the age of 50 years and continues through age 75 years. However, your health care provider may recommend screening at an earlier age if you have risk factors for colon cancer. On a yearly basis, your health care provider may provide home test kits to check for hidden blood in the stool. Use of a small camera at the end of a tube, to directly examine the colon (sigmoidoscopy or colonoscopy), can detect the earliest forms of colorectal cancer. Talk to your health care provider about this at age 50, when routine screening begins. Direct  exam of the colon should be repeated every 5-10 years through age 75 years, unless early forms of pre-cancerous polyps or small growths are found.  People who are at an increased risk for hepatitis B should be screened for this virus. You are considered at high risk for hepatitis B if:  You were born in a country where hepatitis B occurs often. Talk with your health care provider about which countries are considered high risk.  Your parents were born in a high-risk country and you have not received a shot to protect against hepatitis B (hepatitis B vaccine).  You have HIV or AIDS.  You use needles to inject street drugs.  You live with, or have sex with, someone who has hepatitis B.  You get hemodialysis treatment.  You take certain medicines for conditions like cancer, organ transplantation, and autoimmune conditions.  Hepatitis C blood testing is recommended for all people born from 1945 through 1965 and any individual with known risks for hepatitis C.  Practice safe sex. Use condoms and avoid high-risk sexual practices to reduce the spread of sexually transmitted infections (STIs). STIs include gonorrhea, chlamydia, syphilis, trichomonas, herpes, HPV, and human immunodeficiency virus (HIV). Herpes, HIV, and HPV are viral illnesses that have no cure. They can result in disability, cancer, and death.  You should be screened for sexually transmitted illnesses (STIs) including gonorrhea and chlamydia if:  You are sexually active and are younger than 24 years.  You are older than 24 years and your health care provider tells you that you are at risk for this type of infection.  Your sexual activity has changed since you were last screened and you are at an increased risk for chlamydia or gonorrhea. Ask your health care provider if you are at risk.  If you are at risk of being infected with HIV, it is recommended that you take a prescription medicine daily to prevent HIV infection. This is  called preexposure prophylaxis (PrEP). You are considered at risk if:  You are a heterosexual woman, are sexually active, and are at increased risk for HIV infection.  You take drugs by injection.  You are sexually active with a partner who has HIV.  Talk with your health care provider about whether you are at high risk of being infected with HIV. If you choose to begin PrEP, you should first be tested for HIV. You should then be tested every 3 months for as long as you are taking PrEP.  Osteoporosis is a disease in which the bones lose minerals and strength   with aging. This can result in serious bone fractures or breaks. The risk of osteoporosis can be identified using a bone density scan. Women ages 65 years and over and women at risk for fractures or osteoporosis should discuss screening with their health care providers. Ask your health care provider whether you should take a calcium supplement or vitamin D to reduce the rate of osteoporosis.  Menopause can be associated with physical symptoms and risks. Hormone replacement therapy is available to decrease symptoms and risks. You should talk to your health care provider about whether hormone replacement therapy is right for you.  Use sunscreen. Apply sunscreen liberally and repeatedly throughout the day. You should seek shade when your shadow is shorter than you. Protect yourself by wearing long sleeves, pants, a wide-brimmed hat, and sunglasses year round, whenever you are outdoors.  Once a month, do a whole body skin exam, using a mirror to look at the skin on your back. Tell your health care provider of new moles, moles that have irregular borders, moles that are larger than a pencil eraser, or moles that have changed in shape or color.  Stay current with required vaccines (immunizations).  Influenza vaccine. All adults should be immunized every year.  Tetanus, diphtheria, and acellular pertussis (Td, Tdap) vaccine. Pregnant women should  receive 1 dose of Tdap vaccine during each pregnancy. The dose should be obtained regardless of the length of time since the last dose. Immunization is preferred during the 27th-36th week of gestation. An adult who has not previously received Tdap or who does not know her vaccine status should receive 1 dose of Tdap. This initial dose should be followed by tetanus and diphtheria toxoids (Td) booster doses every 10 years. Adults with an unknown or incomplete history of completing a 3-dose immunization series with Td-containing vaccines should begin or complete a primary immunization series including a Tdap dose. Adults should receive a Td booster every 10 years.  Varicella vaccine. An adult without evidence of immunity to varicella should receive 2 doses or a second dose if she has previously received 1 dose. Pregnant females who do not have evidence of immunity should receive the first dose after pregnancy. This first dose should be obtained before leaving the health care facility. The second dose should be obtained 4-8 weeks after the first dose.  Human papillomavirus (HPV) vaccine. Females aged 13-26 years who have not received the vaccine previously should obtain the 3-dose series. The vaccine is not recommended for use in pregnant females. However, pregnancy testing is not needed before receiving a dose. If a female is found to be pregnant after receiving a dose, no treatment is needed. In that case, the remaining doses should be delayed until after the pregnancy. Immunization is recommended for any person with an immunocompromised condition through the age of 26 years if she did not get any or all doses earlier. During the 3-dose series, the second dose should be obtained 4-8 weeks after the first dose. The third dose should be obtained 24 weeks after the first dose and 16 weeks after the second dose.  Zoster vaccine. One dose is recommended for adults aged 60 years or older unless certain conditions are  present.  Measles, mumps, and rubella (MMR) vaccine. Adults born before 1957 generally are considered immune to measles and mumps. Adults born in 1957 or later should have 1 or more doses of MMR vaccine unless there is a contraindication to the vaccine or there is laboratory evidence of immunity to   each of the three diseases. A routine second dose of MMR vaccine should be obtained at least 28 days after the first dose for students attending postsecondary schools, health care workers, or international travelers. People who received inactivated measles vaccine or an unknown type of measles vaccine during 1963-1967 should receive 2 doses of MMR vaccine. People who received inactivated mumps vaccine or an unknown type of mumps vaccine before 1979 and are at high risk for mumps infection should consider immunization with 2 doses of MMR vaccine. For females of childbearing age, rubella immunity should be determined. If there is no evidence of immunity, females who are not pregnant should be vaccinated. If there is no evidence of immunity, females who are pregnant should delay immunization until after pregnancy. Unvaccinated health care workers born before 1957 who lack laboratory evidence of measles, mumps, or rubella immunity or laboratory confirmation of disease should consider measles and mumps immunization with 2 doses of MMR vaccine or rubella immunization with 1 dose of MMR vaccine.  Pneumococcal 13-valent conjugate (PCV13) vaccine. When indicated, a person who is uncertain of her immunization history and has no record of immunization should receive the PCV13 vaccine. An adult aged 19 years or older who has certain medical conditions and has not been previously immunized should receive 1 dose of PCV13 vaccine. This PCV13 should be followed with a dose of pneumococcal polysaccharide (PPSV23) vaccine. The PPSV23 vaccine dose should be obtained at least 8 weeks after the dose of PCV13 vaccine. An adult aged 19  years or older who has certain medical conditions and previously received 1 or more doses of PPSV23 vaccine should receive 1 dose of PCV13. The PCV13 vaccine dose should be obtained 1 or more years after the last PPSV23 vaccine dose.  Pneumococcal polysaccharide (PPSV23) vaccine. When PCV13 is also indicated, PCV13 should be obtained first. All adults aged 65 years and older should be immunized. An adult younger than age 65 years who has certain medical conditions should be immunized. Any person who resides in a nursing home or long-term care facility should be immunized. An adult smoker should be immunized. People with an immunocompromised condition and certain other conditions should receive both PCV13 and PPSV23 vaccines. People with human immunodeficiency virus (HIV) infection should be immunized as soon as possible after diagnosis. Immunization during chemotherapy or radiation therapy should be avoided. Routine use of PPSV23 vaccine is not recommended for American Indians, [place] Natives, or people younger than 65 years unless there are medical conditions that require PPSV23 vaccine. When indicated, people who have unknown immunization and have no record of immunization should receive PPSV23 vaccine. One-time revaccination 5 years after the first dose of PPSV23 is recommended for people aged 19-64 years who have chronic kidney failure, nephrotic syndrome, asplenia, or immunocompromised conditions. People who received 1-2 doses of PPSV23 before age 65 years should receive another dose of PPSV23 vaccine at age 65 years or later if at least 5 years have passed since the previous dose. Doses of PPSV23 are not needed for people immunized with PPSV23 at or after age 65 years.  Meningococcal vaccine. Adults with asplenia or persistent complement component deficiencies should receive 2 doses of quadrivalent meningococcal conjugate (MenACWY-D) vaccine. The doses should be obtained at least 2 months apart.  Microbiologists working with certain meningococcal bacteria, military recruits, people at risk during an outbreak, and people who travel to or live in countries with a high rate of meningitis should be immunized. A first-year college student up through age   21 years who is living in a residence hall should receive a dose if she did not receive a dose on or after her 16th birthday. Adults who have certain high-risk conditions should receive one or more doses of vaccine.  Hepatitis A vaccine. Adults who wish to be protected from this disease, have certain high-risk conditions, work with hepatitis A-infected animals, work in hepatitis A research labs, or travel to or work in countries with a high rate of hepatitis A should be immunized. Adults who were previously unvaccinated and who anticipate close contact with an international adoptee during the first 60 days after arrival in the [place] from a country with a high rate of hepatitis A should be immunized.  Hepatitis B vaccine. Adults who wish to be protected from this disease, have certain high-risk conditions, may be exposed to blood or other infectious body fluids, are household contacts or sex partners of hepatitis B positive people, are clients or workers in certain care facilities, or travel to or work in countries with a high rate of hepatitis B should be immunized.  Haemophilus influenzae type b (Hib) vaccine. A previously unvaccinated person with asplenia or sickle cell disease or having a scheduled splenectomy should receive 1 dose of Hib vaccine. Regardless of previous immunization, a recipient of a hematopoietic stem cell transplant should receive a 3-dose series 6-12 months after her successful transplant. Hib vaccine is not recommended for adults with HIV infection. Preventive Services / Frequency Ages 64 to 68 years  Blood pressure check.** / Every 1 to 2 years.  Lipid and cholesterol check.** / Every 5 years beginning at age  22.  Clinical breast exam.** / Every 3 years for women in their 88s and 53s.  BRCA-related cancer risk assessment.** / For women who have family members with a BRCA-related cancer (breast, ovarian, tubal, or peritoneal cancers).  Pap test.** / Every 2 years from ages 90 through 51. Every 3 years starting at age 21 through age 56 or 3 with a history of 3 consecutive normal Pap tests.  HPV screening.** / Every 3 years from ages 24 through ages 1 to 46 with a history of 3 consecutive normal Pap tests.  Hepatitis C blood test.** / For any individual with known risks for hepatitis C.  Skin self-exam. / Monthly.  Influenza vaccine. / Every year.  Tetanus, diphtheria, and acellular pertussis (Tdap, Td) vaccine.** / Consult your health care provider. Pregnant women should receive 1 dose of Tdap vaccine during each pregnancy. 1 dose of Td every 10 years.  Varicella vaccine.** / Consult your health care provider. Pregnant females who do not have evidence of immunity should receive the first dose after pregnancy.  HPV vaccine. / 3 doses over 6 months, if 72 and younger. The vaccine is not recommended for use in pregnant females. However, pregnancy testing is not needed before receiving a dose.  Measles, mumps, rubella (MMR) vaccine.** / You need at least 1 dose of MMR if you were born in 1957 or later. You may also need a 2nd dose. For females of childbearing age, rubella immunity should be determined. If there is no evidence of immunity, females who are not pregnant should be vaccinated. If there is no evidence of immunity, females who are pregnant should delay immunization until after pregnancy.  Pneumococcal 13-valent conjugate (PCV13) vaccine.** / Consult your health care provider.  Pneumococcal polysaccharide (PPSV23) vaccine.** / 1 to 2 doses if you smoke cigarettes or if you have certain conditions.  Meningococcal vaccine.** /  1 dose if you are age 19 to 21 years and a first-year college  student living in a residence hall, or have one of several medical conditions, you need to get vaccinated against meningococcal disease. You may also need additional booster doses.  Hepatitis A vaccine.** / Consult your health care provider.  Hepatitis B vaccine.** / Consult your health care provider.  Haemophilus influenzae type b (Hib) vaccine.** / Consult your health care provider. Ages 40 to 64 years  Blood pressure check.** / Every 1 to 2 years.  Lipid and cholesterol check.** / Every 5 years beginning at age 20 years.  Lung cancer screening. / Every year if you are aged 55-80 years and have a 30-pack-year history of smoking and currently smoke or have quit within the past 15 years. Yearly screening is stopped once you have quit smoking for at least 15 years or develop a health problem that would prevent you from having lung cancer treatment.  Clinical breast exam.** / Every year after age 40 years.  BRCA-related cancer risk assessment.** / For women who have family members with a BRCA-related cancer (breast, ovarian, tubal, or peritoneal cancers).  Mammogram.** / Every year beginning at age 40 years and continuing for as long as you are in good health. Consult with your health care provider.  Pap test.** / Every 3 years starting at age 30 years through age 65 or 70 years with a history of 3 consecutive normal Pap tests.  HPV screening.** / Every 3 years from ages 30 years through ages 65 to 70 years with a history of 3 consecutive normal Pap tests.  Fecal occult blood test (FOBT) of stool. / Every year beginning at age 50 years and continuing until age 75 years. You may not need to do this test if you get a colonoscopy every 10 years.  Flexible sigmoidoscopy or colonoscopy.** / Every 5 years for a flexible sigmoidoscopy or every 10 years for a colonoscopy beginning at age 50 years and continuing until age 75 years.  Hepatitis C blood test.** / For all people born from 1945 through  1965 and any individual with known risks for hepatitis C.  Skin self-exam. / Monthly.  Influenza vaccine. / Every year.  Tetanus, diphtheria, and acellular pertussis (Tdap/Td) vaccine.** / Consult your health care provider. Pregnant women should receive 1 dose of Tdap vaccine during each pregnancy. 1 dose of Td every 10 years.  Varicella vaccine.** / Consult your health care provider. Pregnant females who do not have evidence of immunity should receive the first dose after pregnancy.  Zoster vaccine.** / 1 dose for adults aged 60 years or older.  Measles, mumps, rubella (MMR) vaccine.** / You need at least 1 dose of MMR if you were born in 1957 or later. You may also need a 2nd dose. For females of childbearing age, rubella immunity should be determined. If there is no evidence of immunity, females who are not pregnant should be vaccinated. If there is no evidence of immunity, females who are pregnant should delay immunization until after pregnancy.  Pneumococcal 13-valent conjugate (PCV13) vaccine.** / Consult your health care provider.  Pneumococcal polysaccharide (PPSV23) vaccine.** / 1 to 2 doses if you smoke cigarettes or if you have certain conditions.  Meningococcal vaccine.** / Consult your health care provider.  Hepatitis A vaccine.** / Consult your health care provider.  Hepatitis B vaccine.** / Consult your health care provider.  Haemophilus influenzae type b (Hib) vaccine.** / Consult your health care provider. Ages 65   years and over  Blood pressure check.** / Every 1 to 2 years.  Lipid and cholesterol check.** / Every 5 years beginning at age 22 years.  Lung cancer screening. / Every year if you are aged 73-80 years and have a 30-pack-year history of smoking and currently smoke or have quit within the past 15 years. Yearly screening is stopped once you have quit smoking for at least 15 years or develop a health problem that would prevent you from having lung cancer  treatment.  Clinical breast exam.** / Every year after age 4 years.  BRCA-related cancer risk assessment.** / For women who have family members with a BRCA-related cancer (breast, ovarian, tubal, or peritoneal cancers).  Mammogram.** / Every year beginning at age 40 years and continuing for as long as you are in good health. Consult with your health care provider.  Pap test.** / Every 3 years starting at age 9 years through age 34 or 91 years with 3 consecutive normal Pap tests. Testing can be stopped between 65 and 70 years with 3 consecutive normal Pap tests and no abnormal Pap or HPV tests in the past 10 years.  HPV screening.** / Every 3 years from ages 57 years through ages 64 or 45 years with a history of 3 consecutive normal Pap tests. Testing can be stopped between 65 and 70 years with 3 consecutive normal Pap tests and no abnormal Pap or HPV tests in the past 10 years.  Fecal occult blood test (FOBT) of stool. / Every year beginning at age 15 years and continuing until age 17 years. You may not need to do this test if you get a colonoscopy every 10 years.  Flexible sigmoidoscopy or colonoscopy.** / Every 5 years for a flexible sigmoidoscopy or every 10 years for a colonoscopy beginning at age 86 years and continuing until age 71 years.  Hepatitis C blood test.** / For all people born from [birth year] through 1967 and any individual with known risks for hepatitis C.  Osteoporosis screening.** / A one-time screening for women ages 83 years and over and women at risk for fractures or osteoporosis.  Skin self-exam. / Monthly.  Influenza vaccine. / Every year.  Tetanus, diphtheria, and acellular pertussis (Tdap/Td) vaccine.** / 1 dose of Td every 10 years.  Varicella vaccine.** / Consult your health care provider.  Zoster vaccine.** / 1 dose for adults aged 61 years or older.  Pneumococcal 13-valent conjugate (PCV13) vaccine.** / Consult your health care provider.  Pneumococcal  polysaccharide (PPSV23) vaccine.** / 1 dose for all adults aged 28 years and older.  Meningococcal vaccine.** / Consult your health care provider.  Hepatitis A vaccine.** / Consult your health care provider.  Hepatitis B vaccine.** / Consult your health care provider.  Haemophilus influenzae type b (Hib) vaccine.** / Consult your health care provider. ** Family history and personal history of risk and conditions may change your health care provider's recommendations. Document Released: 06/27/2001 Document Revised: 09/15/2013 Document Reviewed: 09/26/2010 [facility] Patient Information 2015 [place], [place]. This information is not intended to replace advice given to you by your health care provider. Make sure you discuss any questions you have with your health care provider.

## 2014-06-01 NOTE — Progress Notes (Signed)
Subjective:     [name] is a 39 y.o. female and is here for a comprehensive physical exam. The patient reports no problems.---- at end of ov she c/o occasional chest pain that has been going on for several months.  No sob., no palpitations.     History   Social History  . Marital Status: Married    Spouse Name: N/A    Number of Children: 2  . 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 Smoking status: Never Smoker   . Smokeless tobacco: Never Used  . Alcohol Use: Yes     Comment: rare  . Drug Use: No  . Sexual Activity: Not on file   Other Topics Concern  . Not on file   Social History Narrative   Health Maintenance  Topic Date Due  . MAMMOGRAM  07/31/2014 (Originally 01/21/2014)  . INFLUENZA VACCINE  06/02/2015 (Originally 12/13/2013)  . TETANUS/TDAP  08/25/2014  . PAP SMEAR  11/12/2016    The following portions of the patient's history were reviewed and updated as appropriate:  She  has a past medical history of Depression; GERD (gastroesophageal reflux disease); RAD (reactive airway disease); Asthma; Anxiety; and Gallstones. She  does not have any pertinent problems on file. She  has past surgical history that includes Cesarean section and Cholecystectomy (2007). Her family history includes Arthritis in her paternal grandmother; BRCA 1/2 in her cousin and paternal grandmother; Breast cancer in her mother; Diabetes in her maternal grandmother; Hypertension in her father and mother; Pancreatic cancer in her maternal grandfather; Skin cancer in her father and mother. She  reports that she has never smoked. She has never used smokeless tobacco. She reports that she drinks alcohol. She reports that she does not use illicit drugs. She has a current medication list which includes the following prescription(s): albuterol, alprazolam, cetirizine, dexlansoprazole, fluticasone, levonorgestrel, and vilazodone  hcl. Current Outpatient Prescriptions on File Prior to Visit  Medication Sig Dispense Refill  . albuterol (PROAIR HFA) 108 (90 BASE) MCG/ACT inhaler INHALE 2 PUFFS FOUR TIMES DAILY AS NEEDED 8.5 g 1  . ALPRAZolam (XANAX) 0.5 MG tablet Take 0.5 mg by mouth 2 (two) times daily as needed.      . cetirizine (ZYRTEC) 10 MG tablet Take 10 mg by mouth daily. Seasonal    . fluticasone (FLONASE) 50 MCG/ACT nasal spray Place 2 sprays into the nose daily.    Marland Kitchen levonorgestrel (MIRENA) 20 MCG/24HR IUD 1 each by Intrauterine route once.    . Vilazodone HCl (VIIBRYD) 40 MG TABS Take 40 mg by mouth daily.     No current facility-administered medications on file prior to visit.   She is allergic to amoxicillin; labetalol hcl; and sulfonamide derivatives..  Review of Systems Review of Systems  Constitutional: Negative for activity change, appetite change and fatigue.  HENT: Negative for hearing loss, congestion, tinnitus and ear discharge.  dentist q6m Eyes: Negative for visual disturbance (see optho q1y -- vision corrected to 20/20 with glasses).  Respiratory: Negative for cough, chest tightness and shortness of breath.   Cardiovascular: Negative for chest pain, palpitations and leg swelling.  Gastrointestinal: Negative for abdominal pain, diarrhea, constipation and abdominal distention.  Genitourinary: Negative for urgency, frequency, decreased urine volume and difficulty urinating.  Musculoskeletal: Negative for back pain, arthralgias and gait problem.  Skin: Negative for color change, pallor and rash.  Neurological: Negative for dizziness, light-headedness, numbness and headaches.  Hematological: Negative for adenopathy. Does not bruise/bleed easily.  Psychiatric/Behavioral: Negative for suicidal ideas, confusion, sleep disturbance, self-injury, dysphoric mood, decreased concentration and agitation.       Objective:    BP 114/72 mmHg  Pulse 98  Temp(Src) 98.3 F (36.8 C) (Oral)  Ht $R'5\' 3"'TX$   (1.6 m)  Wt 263 lb 12.8 oz (119.659 kg)  BMI 46.74 kg/m2  SpO2 98% General appearance: alert, cooperative, appears stated age and no distress Head: Normocephalic, without obvious abnormality, atraumatic Eyes: negative findings: lids and lashes normal and pupils equal, round, reactive to light and accomodation Ears: normal TM's and external ear canals both ears Nose: Nares normal. Septum midline. Mucosa normal. No drainage or sinus tenderness. Throat: lips, mucosa, and tongue normal; teeth and gums normal Neck: no adenopathy, no carotid bruit, no JVD, supple, symmetrical, trachea midline and thyroid not enlarged, symmetric, no tenderness/mass/nodules Back: symmetric, no curvature. ROM normal. No CVA tenderness. Lungs: clear to auscultation bilaterally Breasts: deferred0-gyn Heart: regular rate and rhythm, S1, S2 normal, no murmur, click, rub or gallop Abdomen: soft, non-tender; bowel sounds normal; no masses,  no organomegaly Pelvic: deferred--gyn Extremities: extremities normal, atraumatic, no cyanosis or edema Pulses: 2+ and symmetric Skin: Skin color, texture, turgor normal. No rashes or lesions Lymph nodes: Cervical, supraclavicular, and axillary nodes normal. Neurologic: Alert and oriented X 3, normal strength and tone. Normal symmetric reflexes. Normal coordination and gait Psych- no depression, no anxiety      Assessment:    Healthy female exam.     Plan:     ghm utd Check labs See After Visit Summary for Counseling Recommendations    1. Visit for screening mammogram  - MM Digital Screening; Future  2. Gastroesophageal reflux disease without esophagitis  - dexlansoprazole (DEXILANT) 60 MG capsule; Take 1 capsule (60 mg total) by mouth daily.  Dispense: 90 capsule; Refill: 3  3. Preventative health care  - Basic metabolic panel; Future - CBC with Differential; Future - Hepatic function panel; Future - Lipid panel; Future - POCT urinalysis dipstick; Future - TSH;  Future  4. Other chest pain Probably musculoskeletal/ anxiety - EKG 12-Lead  5. Encounter for immunization Flu shot given

## 2014-06-01 NOTE — Progress Notes (Signed)
Pre visit review using our clinic review tool, if applicable. No additional management support is needed unless otherwise documented below in the visit note. 

## 2014-06-03 ENCOUNTER — Other Ambulatory Visit (INDEPENDENT_AMBULATORY_CARE_PROVIDER_SITE_OTHER): Payer: BC Managed Care – PPO

## 2014-06-03 DIAGNOSIS — Z0000 Encounter for general adult medical examination without abnormal findings: Secondary | ICD-10-CM

## 2014-06-03 DIAGNOSIS — Z1231 Encounter for screening mammogram for malignant neoplasm of breast: Secondary | ICD-10-CM

## 2014-06-03 LAB — CBC WITH DIFFERENTIAL/PLATELET
BASOS ABS: 0 10*3/uL (ref 0.0–0.1)
Basophils Relative: 0.4 % (ref 0.0–3.0)
EOS ABS: 0.1 10*3/uL (ref 0.0–0.7)
Eosinophils Relative: 1.4 % (ref 0.0–5.0)
HCT: 38.6 % (ref 36.0–46.0)
Hemoglobin: 13.4 g/dL (ref 12.0–15.0)
LYMPHS PCT: 17.6 % (ref 12.0–46.0)
Lymphs Abs: 1.5 10*3/uL (ref 0.7–4.0)
MCHC: 34.7 g/dL (ref 30.0–36.0)
MCV: 85.4 fl (ref 78.0–100.0)
MONOS PCT: 7 % (ref 3.0–12.0)
Monocytes Absolute: 0.6 10*3/uL (ref 0.1–1.0)
NEUTROS PCT: 73.6 % (ref 43.0–77.0)
Neutro Abs: 6.3 10*3/uL (ref 1.4–7.7)
PLATELETS: 297 10*3/uL (ref 150.0–400.0)
RBC: 4.52 Mil/uL (ref 3.87–5.11)
RDW: 12.8 % (ref 11.5–15.5)
WBC: 8.5 10*3/uL (ref 4.0–10.5)

## 2014-06-03 LAB — HEPATIC FUNCTION PANEL
ALK PHOS: 72 U/L (ref 39–117)
ALT: 12 U/L (ref 0–35)
AST: 17 U/L (ref 0–37)
Albumin: 4 g/dL (ref 3.5–5.2)
BILIRUBIN DIRECT: 0.1 mg/dL (ref 0.0–0.3)
TOTAL PROTEIN: 6.9 g/dL (ref 6.0–8.3)
Total Bilirubin: 0.3 mg/dL (ref 0.2–1.2)

## 2014-06-03 LAB — LIPID PANEL
Cholesterol: 169 mg/dL (ref 0–200)
HDL: 48.8 mg/dL (ref >39.00)
LDL Cholesterol: 106 mg/dL — ABNORMAL HIGH (ref 0–99)
NONHDL: 120.2
Total CHOL/HDL Ratio: 3
Triglycerides: 72 mg/dL (ref 0.0–149.0)
VLDL: 14.4 mg/dL (ref 0.0–40.0)

## 2014-06-03 LAB — BASIC METABOLIC PANEL
BUN: 11 mg/dL (ref 6–23)
CHLORIDE: 102 meq/L (ref 96–112)
CO2: 31 meq/L (ref 19–32)
CREATININE: 0.97 mg/dL (ref 0.40–1.20)
Calcium: 9.2 mg/dL (ref 8.4–10.5)
GFR: 67.25 mL/min (ref >60.00)
Glucose, Bld: 101 mg/dL — ABNORMAL HIGH (ref 70–99)
Potassium: 4.2 mEq/L (ref 3.5–5.1)
Sodium: 137 mEq/L (ref 135–145)

## 2014-06-03 LAB — TSH: TSH: 3.68 u[IU]/mL (ref 0.35–4.50)

## 2014-06-11 ENCOUNTER — Ambulatory Visit
Admission: RE | Admit: 2014-06-11 | Discharge: 2014-06-11 | Disposition: A | Discharge status: Home / Self Care | Payer: BC Managed Care – PPO | Source: Ambulatory Visit | Attending: Family Medicine | Admitting: Family Medicine

## 2014-08-11 ENCOUNTER — Telehealth: Payer: Self-pay | Admitting: *Deleted

## 2014-08-11 NOTE — Telephone Encounter (Signed)
[facility] [employer] FAXED OVER APPROVAL LETTER FOR DEXILANT. SENT DOWN TO BE SCANNED IN

## 2014-11-06 ENCOUNTER — Encounter: Payer: Self-pay | Admitting: Family Medicine

## 2014-11-06 NOTE — Telephone Encounter (Signed)
Ok to give her a note saying she can got to weight [facility]  Last weight and bmi should be included

## 2014-11-11 ENCOUNTER — Other Ambulatory Visit: Payer: Self-pay | Admitting: Family Medicine

## 2014-11-26 ENCOUNTER — Other Ambulatory Visit: Payer: Self-pay | Admitting: Family Medicine

## 2014-11-26 ENCOUNTER — Encounter: Payer: Self-pay | Admitting: Family Medicine

## 2014-12-07 NOTE — Telephone Encounter (Addendum)
Hello Candise Bowens or Jolayne Haines still has not received the referral. Would you resend?    KP

## 2015-01-13 ENCOUNTER — Encounter: Payer: Self-pay | Admitting: Family Medicine

## 2015-02-28 ENCOUNTER — Encounter: Payer: Self-pay | Admitting: Family Medicine

## 2015-03-04 ENCOUNTER — Encounter: Payer: Self-pay | Admitting: Medical

## 2015-03-04 ENCOUNTER — Ambulatory Visit: Payer: BC Managed Care – PPO | Admitting: Family Medicine

## 2015-03-04 ENCOUNTER — Ambulatory Visit (INDEPENDENT_AMBULATORY_CARE_PROVIDER_SITE_OTHER): Payer: BC Managed Care – PPO | Admitting: Medical

## 2015-03-04 VITALS — BP 120/80 | HR 96 | Temp 98.1°F | Ht 63.0 in | Wt 242.0 lb

## 2015-03-04 DIAGNOSIS — J0101 Acute recurrent maxillary sinusitis: Secondary | ICD-10-CM | POA: Diagnosis not present

## 2015-03-04 DIAGNOSIS — J208 Acute bronchitis due to other specified organisms: Secondary | ICD-10-CM

## 2015-03-04 MED ORDER — BECLOMETHASONE DIPROPIONATE 40 MCG/ACT IN AERS: 2.0000 | INHALATION_SPRAY | Freq: Two times a day (BID) | RESPIRATORY_TRACT | Status: DC

## 2015-03-04 MED ORDER — PREDNISONE 20 MG PO TABS: ORAL_TABLET | ORAL | Status: DC

## 2015-03-04 MED ORDER — BENZONATATE 100 MG PO CAPS
100.0000 mg | ORAL_CAPSULE | Freq: Three times a day (TID) | ORAL | Status: DC | PRN
Start: 2015-03-04 — End: 2015-03-22

## 2015-03-04 MED ORDER — AZITHROMYCIN 250 MG PO TABS: ORAL_TABLET | ORAL | Status: DC

## 2015-03-04 NOTE — Patient Instructions (Addendum)
You appear to have a sinus infection. I am prescribing azithromycin antibiotic for the infection. To help with the nasal congestion take your nasal steroid. For your associated cough, I prescribed  Benzonatate for cough medicine.  For your wheezing, continue albuterol. Add qvar. If using both but still wheezing start prednisone tabs.  Rest, hydrate, tylenol for fever.  Follow up in 7 days or as needed.

## 2015-03-04 NOTE — Progress Notes (Signed)
Subjective:    Patient ID: [name], female    DOB: 12-20-1975, 39 y.o.   MRN: [id number]  HPI  Sick for one month. Pt describes  uri type symptoms vs allergy signs and symptoms over past moth. Pt states on and off pnd, dry cough, and wheezing. Sinus pressure on and off as well with recent ear pain. Early on felt febrile for 3 days. Pt has used flonase, zyrtec and albuterol over past month. Not getting better.  Pt has mirena iud.  Review of Systems  Constitutional: Positive for fever and chills.       Initially at beginning felt febrile for 3 days.  HENT: Positive for congestion, ear discharge, postnasal drip, rhinorrhea and sinus pressure. Negative for sneezing.        Sinus pressure on and off for a month. Some ear pain on and off.  Respiratory: Positive for cough and wheezing. Negative for chest tightness.        Dry cough.  She uses inhaler albuterol for cough and wheezing.  Wheezing over past month on and off.  Cardiovascular: Negative for chest pain and palpitations.   Past Medical History  Diagnosis Date  . Depression   . GERD (gastroesophageal reflux disease)   . RAD (reactive airway disease)   . Asthma   . Anxiety   . Gallstones     Social History   Social History  . Marital Status: Married    Spouse Name: N/A  . Number of Children: 2  . 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 Smoking status: Never Smoker   . Smokeless tobacco: Never Used  . Alcohol Use: Yes     Comment: rare  . Drug Use: No  . Sexual Activity: Not on file   Other Topics Concern  . Not on file   Social History Narrative    Past Surgical History  Procedure Laterality Date  . Cesarean section    . Cholecystectomy  2007    Family History  Problem Relation Age of Onset  . Breast cancer Mother   . Hypertension Mother   . Skin cancer Mother   . Pancreatic cancer Maternal Grandfather   . Arthritis Paternal  Grandmother   . BRCA 1/2 Paternal Grandmother   . Hypertension Father   . Skin cancer Father   . BRCA 1/2 Cousin   . Diabetes Maternal Grandmother     Allergies  Allergen Reactions  . Amoxicillin   . Labetalol Hcl   . Sulfonamide Derivatives     Current Outpatient Prescriptions on File Prior to Visit  Medication Sig Dispense Refill  . albuterol (PROAIR HFA) 108 (90 BASE) MCG/ACT inhaler INHALE 2 PUFFS FOUR TIMES DAILY AS NEEDED 8.5 g 1  . ALPRAZolam (XANAX) 0.5 MG tablet Take 0.5 mg by mouth 2 (two) times daily as needed.      . cetirizine (ZYRTEC) 10 MG tablet Take 10 mg by mouth daily. Seasonal    . DEXILANT 60 MG capsule TAKE ONE CAPSULE BY MOUTH ONCE DAILY 30 capsule 5  . fluticasone (FLONASE) 50 MCG/ACT nasal spray Place 2 sprays into the nose daily.    Marland Kitchen levonorgestrel (MIRENA) 20 MCG/24HR IUD 1 each by Intrauterine route once.    . Vilazodone HCl (VIIBRYD) 40 MG TABS Take 40 mg by mouth daily.     No current facility-administered medications on file prior to visit.    BP  120/80 mmHg  Pulse 96  Temp(Src) 98.1 F (36.7 C) (Oral)  Ht $R'5\' 3"'Mb$  (1.6 m)  Wt 242 lb (109.77 kg)  BMI 42.88 kg/m2  SpO2 97%       Objective:   Physical Exam  General  Mental Status - Alert. General Appearance - Well groomed. Not in acute distress.  Skin Rashes- No Rashes.  HEENT Head- Normal. Ear Auditory Canal - Left- Normal. Right - Normal.Tympanic Membrane- Left- Normal. Right- Normal. Eye Sclera/Conjunctiva- Left- Normal. Right- Normal. Nose & Sinuses Nasal Mucosa- Left-  Boggy and Congested. Right-  Boggy and  Congested.Bilateral maxillary and frontal sinus pressure. Mouth & Throat Lips: Upper Lip- Normal: no dryness, cracking, pallor, cyanosis, or vesicular eruption. Lower Lip-Normal: no dryness, cracking, pallor, cyanosis or vesicular eruption. Buccal Mucosa- Bilateral- No Aphthous ulcers. Oropharynx- No Discharge or Erythema. +pnd. Tonsils: Characteristics- Bilateral- No  Erythema or Congestion. Size/Enlargement- Bilateral- No enlargement. Discharge- bilateral-None.  Neck Neck- Supple. No Masses.   Chest and Lung Exam Auscultation: Breath Sounds:-Clear even and unlabored.  Cardiovascular Auscultation:Rythm- Regular, rate and rhythm. Murmurs & Other Heart Sounds:Ausculatation of the heart reveal- No Murmurs.  Lymphatic Head & Neck General Head & Neck Lymphatics: Bilateral: Description- No Localized lymphadenopathy.       Assessment & Plan:  You appear to have a sinus infection. I am prescribing azithromycin antibiotic for the infection. To help with the nasal congestion take your nasal steroid. For your associated cough, I prescribed  Benzonatate for cough medicine.  For your wheezing, continue albuterol. Add qvar. If using both but still wheezing start prednisone tabs.  Rest, hydrate, tylenol for fever.  Follow up in 7 days or as needed.

## 2015-03-04 NOTE — Progress Notes (Signed)
Pre visit review using our clinic review tool, if applicable. No additional management support is needed unless otherwise documented below in the visit note. 

## 2015-03-04 NOTE — Addendum Note (Signed)
Addended by: Gwenevere AbbotSAGUIER, Keelia Graybill M on: 03/04/2015 03:14 PM   Modules accepted: Orders

## 2015-03-22 ENCOUNTER — Ambulatory Visit (INDEPENDENT_AMBULATORY_CARE_PROVIDER_SITE_OTHER): Payer: BC Managed Care – PPO | Admitting: Family Medicine

## 2015-03-22 ENCOUNTER — Encounter: Payer: Self-pay | Admitting: Family Medicine

## 2015-03-22 VITALS — BP 124/80 | HR 79 | Temp 98.8°F | Ht 63.0 in | Wt 242.0 lb

## 2015-03-22 DIAGNOSIS — M25562 Pain in left knee: Secondary | ICD-10-CM | POA: Diagnosis not present

## 2015-03-22 DIAGNOSIS — M25561 Pain in right knee: Secondary | ICD-10-CM | POA: Diagnosis not present

## 2015-03-22 NOTE — Patient Instructions (Signed)

## 2015-03-22 NOTE — Progress Notes (Signed)
Patient ID: [name], female    DOB: [birth year]  Age: 39 y.o. MRN: [id number]    Subjective:  Subjective  HPI [name] presents for knee pain b/l but L worse.  No known injury.  It has been worsening over last several weeks  Review of Systems  Constitutional: Negative for diaphoresis, appetite change, fatigue and unexpected weight change.  Eyes: Negative for pain, redness and visual disturbance.  Respiratory: Negative for cough, chest tightness, shortness of breath and wheezing.   Cardiovascular: Negative for chest pain, palpitations and leg swelling.  Endocrine: Negative for cold intolerance, heat intolerance, polydipsia, polyphagia and polyuria.  Genitourinary: Negative for dysuria, frequency and difficulty urinating.  Musculoskeletal: Positive for joint swelling and gait problem.  Neurological: Negative for dizziness, light-headedness, numbness and headaches.    History Past Medical History  Diagnosis Date  . Depression   . GERD (gastroesophageal reflux disease)   . RAD (reactive airway disease)   . Asthma   . Anxiety   . Gallstones     She has past surgical history that includes Cesarean section and Cholecystectomy (2007).   Her family history includes Arthritis in her paternal grandmother; BRCA 1/2 in her cousin and paternal grandmother; Breast cancer in her mother; Diabetes in her maternal grandmother; Hypertension in her father and mother; Pancreatic cancer in her maternal grandfather; Skin cancer in her father and mother.She reports that she has never smoked. She has never used smokeless tobacco. She reports that she drinks alcohol. She reports that she does not use illicit drugs.  Current Outpatient Prescriptions on File Prior to Visit  Medication Sig Dispense Refill  . albuterol (PROAIR HFA) 108 (90 BASE) MCG/ACT inhaler INHALE 2 PUFFS FOUR TIMES DAILY AS NEEDED 8.5 g 1  . ALPRAZolam (XANAX) 0.5 MG tablet Take 0.5 mg by mouth 2 (two) times daily as needed.       . cetirizine (ZYRTEC) 10 MG tablet Take 10 mg by mouth daily. Seasonal    . DEXILANT 60 MG capsule TAKE ONE CAPSULE BY MOUTH ONCE DAILY 30 capsule 5  . fluticasone (FLONASE) 50 MCG/ACT nasal spray Place 2 sprays into the nose daily.    Marland Kitchen levonorgestrel (MIRENA) 20 MCG/24HR IUD 1 each by Intrauterine route once.    . Phentermine-Topiramate (QSYMIA) 7.5-46 MG CP24 Take 7.5 mg by mouth.    . Vilazodone HCl (VIIBRYD) 40 MG TABS Take 40 mg by mouth daily.     No current facility-administered medications on file prior to visit.     Objective:  Objective Physical Exam  Constitutional: She is oriented to person, place, and time. She appears well-developed and well-nourished.  HENT:  Head: Normocephalic and atraumatic.  Eyes: Conjunctivae and EOM are normal.  Neck: Normal range of motion. Neck supple. No JVD present. Carotid bruit is not present. No thyromegaly present.  Cardiovascular: Normal rate, regular rhythm and normal heart sounds.   No murmur heard. Pulmonary/Chest: Effort normal and breath sounds normal. No respiratory distress. She has no wheezes. She has no rales. She exhibits no tenderness.  Musculoskeletal: She exhibits no edema.       Left knee: She exhibits normal range of motion, no swelling and no effusion. Tenderness found. Lateral joint line tenderness noted.  Neurological: She is alert and oriented to person, place, and time.  Psychiatric: She has a normal mood and affect.   BP 124/80 mmHg  Pulse 79  Temp(Src) 98.8 F (37.1 C) (Oral)  Ht $R'5\' 3"'NE$  (1.6 m)  Wt 242  lb (109.77 kg)  BMI 42.88 kg/m2  SpO2 99% Wt Readings from Last 3 Encounters:  03/22/15 242 lb (109.77 kg)  03/04/15 242 lb (109.77 kg)  06/01/14 263 lb 12.8 oz (119.659 kg)     Lab Results  Component Value Date   WBC 8.5 06/03/2014   HGB 13.4 06/03/2014   HCT 38.6 06/03/2014   PLT 297.0 06/03/2014   GLUCOSE 101* 06/03/2014   CHOL 169 06/03/2014   TRIG 72.0 06/03/2014   HDL 48.80 06/03/2014    LDLDIRECT 154.3 07/28/2010   LDLCALC 106* 06/03/2014   ALT 12 06/03/2014   AST 17 06/03/2014   NA 137 06/03/2014   K 4.2 06/03/2014   CL 102 06/03/2014   CREATININE 0.97 06/03/2014   BUN 11 06/03/2014   CO2 31 06/03/2014   TSH 3.68 06/03/2014    Mm Digital Screening  06/11/2014  CLINICAL DATA:  Screening. EXAM: DIGITAL SCREENING BILATERAL MAMMOGRAM WITH CAD COMPARISON:  Previous exam(s). ACR Breast Density Category b: There are scattered areas of fibroglandular density. FINDINGS: There are no findings suspicious for malignancy. Images were processed with CAD. IMPRESSION: No mammographic evidence of malignancy. A result letter of this screening mammogram will be mailed directly to the patient. RECOMMENDATION: Screening mammogram in one year. (Code:SM-B-01Y) BI-RADS CATEGORY  1: Negative. Electronically Signed   By: Lovey Newcomer M.D.   On: 06/11/2014 12:17     Assessment & Plan:  Plan I have discontinued Ms. Crayton's azithromycin, benzonatate, predniSONE, and beclomethasone. I am also having her maintain her ALPRAZolam, cetirizine, albuterol, fluticasone, levonorgestrel, Vilazodone HCl, DEXILANT, and Phentermine-Topiramate.  No orders of the defined types were placed in this encounter.    Problem List Items Addressed This Visit    Knee pain, left    Knee sleeve Refer to sports med con't aleve etc       Other Visit Diagnoses    Right knee pain    -  Primary    Relevant Orders    Ambulatory referral to Sports Medicine       Follow-up: Return if symptoms worsen or fail to improve.  Garnet Koyanagi, DO

## 2015-03-22 NOTE — Assessment & Plan Note (Signed)
Knee sleeve Refer to sports med con't aleve etc

## 2015-03-22 NOTE — Progress Notes (Signed)
Pre visit review using our clinic review tool, if applicable. No additional management support is needed unless otherwise documented below in the visit note. 

## 2015-03-23 ENCOUNTER — Ambulatory Visit: Payer: BC Managed Care – PPO | Admitting: Family Medicine

## 2015-03-31 ENCOUNTER — Ambulatory Visit: Payer: BC Managed Care – PPO | Admitting: Family Medicine

## 2015-05-12 ENCOUNTER — Encounter: Payer: Self-pay | Admitting: Family Medicine

## 2015-05-13 ENCOUNTER — Other Ambulatory Visit: Payer: Self-pay | Admitting: Family Medicine

## 2015-05-13 DIAGNOSIS — J0110 Acute frontal sinusitis, unspecified: Secondary | ICD-10-CM

## 2015-05-13 MED ORDER — AZITHROMYCIN 250 MG PO TABS: ORAL_TABLET | ORAL | Status: DC

## 2015-05-30 ENCOUNTER — Other Ambulatory Visit: Payer: Self-pay | Admitting: Family Medicine

## 2015-06-09 ENCOUNTER — Encounter: Payer: Self-pay | Admitting: Family Medicine

## 2015-06-09 ENCOUNTER — Ambulatory Visit (INDEPENDENT_AMBULATORY_CARE_PROVIDER_SITE_OTHER): Payer: BC Managed Care – PPO | Admitting: Family Medicine

## 2015-06-09 ENCOUNTER — Telehealth: Payer: Self-pay | Admitting: Behavioral Health

## 2015-06-09 VITALS — BP 126/83 | HR 105 | Temp 99.3°F | Ht 63.0 in | Wt 232.0 lb

## 2015-06-09 DIAGNOSIS — J0100 Acute maxillary sinusitis, unspecified: Secondary | ICD-10-CM | POA: Diagnosis not present

## 2015-06-09 DIAGNOSIS — H66002 Acute suppurative otitis media without spontaneous rupture of ear drum, left ear: Secondary | ICD-10-CM | POA: Diagnosis not present

## 2015-06-09 DIAGNOSIS — H6692 Otitis media, unspecified, left ear: Secondary | ICD-10-CM | POA: Insufficient documentation

## 2015-06-09 MED ORDER — PROMETHAZINE-DM 6.25-15 MG/5ML PO SYRP: 5.0000 mL | ORAL_SOLUTION | Freq: Four times a day (QID) | ORAL | Status: DC | PRN

## 2015-06-09 MED ORDER — CLARITHROMYCIN 500 MG PO TABS: 500.0000 mg | ORAL_TABLET | Freq: Two times a day (BID) | ORAL | Status: DC

## 2015-06-09 NOTE — Progress Notes (Signed)
   Subjective:    Patient ID: [name], female    DOB: 1975/12/13, 40 y.o.   MRN: [id number]  HPI URI- sxs started Saturday w/ nasal congestion.  sxs worsened Sunday.  'tired, achy'. + HA.  Cough is wet but not productive.  Chest is tight w/ some SOB.  + sinus pain.  No tooth pain.  No fevers.  + sick contacts.  No N/V/D.  No ear pain.  Took Sudafed w/o relief.   Review of Systems For ROS see HPI     Objective:   Physical Exam  Constitutional: She appears well-developed and well-nourished. No distress.  HENT:  Head: Normocephalic and atraumatic.  Right Ear: Tympanic membrane normal.  Left Ear: Tympanic membrane is erythematous and bulging.  Nose: Mucosal edema and rhinorrhea present. Right sinus exhibits maxillary sinus tenderness and frontal sinus tenderness. Left sinus exhibits maxillary sinus tenderness and frontal sinus tenderness.  Mouth/Throat: Uvula is midline and mucous membranes are normal. Posterior oropharyngeal erythema present. No oropharyngeal exudate.  Eyes: Conjunctivae and EOM are normal. Pupils are equal, round, and reactive to light.  Neck: Normal range of motion. Neck supple.  Cardiovascular: Normal rate, regular rhythm and normal heart sounds.   Pulmonary/Chest: Effort normal and breath sounds normal. No respiratory distress. She has no wheezes.  Lymphadenopathy:    She has no cervical adenopathy.  Vitals reviewed.         Assessment & Plan:

## 2015-06-09 NOTE — Telephone Encounter (Signed)
Caller: Arline Asp at [facility] [phone]  Reason for Call: Drug Interaction  Arline Asp reported a drug interaction with clarithromycin that was ordered today and the patient's Viibryd medication. She would like to know if PCP is wanting to make any changes.  Please advise.

## 2015-06-09 NOTE — Telephone Encounter (Signed)
Patient notified. Agreed. 

## 2015-06-09 NOTE — Patient Instructions (Signed)
Follow up as needed Start the Biaxin twice daily- take w/ food Drink plenty of fluids Mucinex DM for daytime cough Promethazine cough syrup for nights/weekends- will cause drowsiness REST! Call with any questions or concerns Hang in there!!!

## 2015-06-09 NOTE — Assessment & Plan Note (Signed)
New.  Pt's PE consistent w/ L ear infection.  Start Biaxin due to Amoxicillin allergy.  Reviewed supportive care and red flags that should prompt return.  Pt expressed understanding and is in agreement w/ plan.

## 2015-06-09 NOTE — Progress Notes (Signed)
Pre visit review using our clinic review tool, if applicable. No additional management support is needed unless otherwise documented below in the visit note. 

## 2015-06-09 NOTE — Telephone Encounter (Signed)
Pt has taken these medications together previously- no changes at this time

## 2015-06-09 NOTE — Assessment & Plan Note (Signed)
New.  Pt's sxs and PE consistent w/ infxn.  Start abx.  Cough meds prn.  Reviewed supportive care and red flags that should prompt return.  Pt expressed understanding and is in agreement w/ plan.  

## 2015-06-09 NOTE — Telephone Encounter (Signed)
Informed the pharmacy of the provider's recommendations below. Pharmacist agreed to fill the prescription. There were no further questions or concerns prior to the end of call.

## 2015-06-16 ENCOUNTER — Encounter: Payer: Self-pay | Admitting: Family Medicine

## 2015-06-23 ENCOUNTER — Ambulatory Visit (INDEPENDENT_AMBULATORY_CARE_PROVIDER_SITE_OTHER): Payer: BC Managed Care – PPO | Admitting: Family Medicine

## 2015-06-23 ENCOUNTER — Encounter: Payer: Self-pay | Admitting: Family Medicine

## 2015-06-23 VITALS — BP 110/98 | HR 93 | Temp 98.4°F | Ht 63.0 in | Wt 229.2 lb

## 2015-06-23 DIAGNOSIS — J0100 Acute maxillary sinusitis, unspecified: Secondary | ICD-10-CM

## 2015-06-23 MED ORDER — PREDNISONE 10 MG PO TABS: ORAL_TABLET | ORAL | Status: DC

## 2015-06-23 NOTE — Progress Notes (Signed)
Pre visit review using our clinic review tool, if applicable. No additional management support is needed unless otherwise documented below in the visit note. 

## 2015-06-23 NOTE — Patient Instructions (Signed)
Follow up as needed Start the Prednisone as directed- take w/ food Drink plenty of fluids Call with any questions or concerns Hang in there!!!

## 2015-06-23 NOTE — Assessment & Plan Note (Signed)
Pt's sinuses are currently inflamed rather than infected.  No need for repeat abx.  Start pred taper to improve inflammation.  Refer to ENT as pt reports this is a chronic, ongoing issue for her and she spends '95% of my life living like my head is full of cotton'.  Reviewed supportive care and red flags that should prompt return.  Pt expressed understanding and is in agreement w/ plan.

## 2015-06-23 NOTE — Progress Notes (Signed)
   Subjective:    Patient ID: [name], female    DOB: 1975/07/02, 40 y.o.   MRN: [id number]  HPI Congestion- completed course of Biaxin.  L ear remains sore.  Continues to have sinus and nasal congestion.  Taking Zyrtec daily and using Flonase.  No fevers.  Cough has resolved.  Fatigue is improving.  Still having sinus pressure and tooth pain.   Review of Systems For ROS see HPI     Objective:   Physical Exam  Constitutional: She is oriented to person, place, and time. She appears well-developed and well-nourished. No distress.  HENT:  Head: Normocephalic and atraumatic.  L TM retracted, R TM WNL Mild TTP over L maxillary sinus No TTP over frontal sinuses  Neurological: She is alert and oriented to person, place, and time.  Skin: Skin is warm and dry.  Psychiatric: She has a normal mood and affect. Her behavior is normal. Thought content normal.  Vitals reviewed.         Assessment & Plan:

## 2015-07-12 DIAGNOSIS — H9202 Otalgia, left ear: Secondary | ICD-10-CM | POA: Insufficient documentation

## 2015-07-12 DIAGNOSIS — M26629 Arthralgia of temporomandibular joint, unspecified side: Secondary | ICD-10-CM | POA: Insufficient documentation

## 2015-08-26 ENCOUNTER — Ambulatory Visit (INDEPENDENT_AMBULATORY_CARE_PROVIDER_SITE_OTHER): Payer: BC Managed Care – PPO | Admitting: Family Medicine

## 2015-08-26 ENCOUNTER — Encounter: Payer: Self-pay | Admitting: Family Medicine

## 2015-08-26 VITALS — BP 116/80 | HR 99 | Temp 98.7°F | Ht 63.0 in | Wt 223.6 lb

## 2015-08-26 DIAGNOSIS — K219 Gastro-esophageal reflux disease without esophagitis: Secondary | ICD-10-CM

## 2015-08-26 DIAGNOSIS — Z0000 Encounter for general adult medical examination without abnormal findings: Secondary | ICD-10-CM

## 2015-08-26 DIAGNOSIS — J302 Other seasonal allergic rhinitis: Secondary | ICD-10-CM

## 2015-08-26 MED ORDER — DEXLANSOPRAZOLE 60 MG PO CPDR: 1.0000 | DELAYED_RELEASE_CAPSULE | Freq: Every day | ORAL | Status: DC

## 2015-08-26 MED ORDER — FLUTICASONE PROPIONATE 50 MCG/ACT NA SUSP: 2.0000 | Freq: Every day | NASAL | Status: DC

## 2015-08-26 NOTE — Progress Notes (Signed)
Subjective:     [name] is a 40 y.o. female and is here for a comprehensive physical exam. The patient reports no problems.  Social History   Social History  . Marital Status: Married    Spouse Name: N/A  . Number of Children: 2  . 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 Smoking status: Never Smoker   . Smokeless tobacco: Never Used  . Alcohol Use: Yes     Comment: rare  . Drug Use: No  . Sexual Activity: Yes   Other Topics Concern  . Not on file   Social History Narrative   Health Maintenance  Topic Date Due  . HIV Screening  05/21/1988  . MAMMOGRAM  06/12/2015  . INFLUENZA VACCINE  12/14/2015  . PAP SMEAR  11/12/2016  . TETANUS/TDAP  10/10/2021    The following portions of the patient's history were reviewed and updated as appropriate:  She  has a past medical history of Depression; GERD (gastroesophageal reflux disease); RAD (reactive airway disease); Asthma; Anxiety; and Gallstones. She  does not have any pertinent problems on file. She  has past surgical history that includes Cesarean section and Cholecystectomy (2007). Her family history includes Arthritis in her paternal grandmother; BRCA 1/2 in her cousin and paternal grandmother; Breast cancer in her mother; Diabetes in her maternal grandmother; Hypertension in her father and mother; Pancreatic cancer in her maternal grandfather; Skin cancer in her father and mother. She  reports that she has never smoked. She has never used smokeless tobacco. She reports that she drinks alcohol. She reports that she does not use illicit drugs. She has a current medication list which includes the following prescription(s): albuterol, alprazolam, cetirizine, dexlansoprazole, fluticasone, levonorgestrel, phentermine, and vilazodone hcl. Current Outpatient Prescriptions on File Prior to Visit  Medication Sig Dispense Refill  . albuterol (PROAIR HFA) 108 (90  BASE) MCG/ACT inhaler INHALE 2 PUFFS FOUR TIMES DAILY AS NEEDED 8.5 g 1  . ALPRAZolam (XANAX) 0.5 MG tablet Take 0.5 mg by mouth 2 (two) times daily as needed.      . cetirizine (ZYRTEC) 10 MG tablet Take 10 mg by mouth daily. Seasonal    . levonorgestrel (MIRENA) 20 MCG/24HR IUD 1 each by Intrauterine route once.    . Vilazodone HCl (VIIBRYD) 40 MG TABS Take 40 mg by mouth daily.     No current facility-administered medications on file prior to visit.   She is allergic to amoxicillin; labetalol hcl; and sulfonamide derivatives..  Review of Systems Review of Systems  Constitutional: Negative for activity change, appetite change and fatigue.  HENT: Negative for hearing loss, congestion, tinnitus and ear discharge.  dentist q18mEyes: Negative for visual disturbance (see optho q1y -- vision corrected to 20/20 with glasses).  Respiratory: Negative for cough, chest tightness and shortness of breath.   Cardiovascular: Negative for chest pain, palpitations and leg swelling.  Gastrointestinal: Negative for abdominal pain, diarrhea, constipation and abdominal distention.  Genitourinary: Negative for urgency, frequency, decreased urine volume and difficulty urinating.  Musculoskeletal: Negative for back pain, arthralgias and gait problem.  Skin: Negative for color change, pallor and rash.  Neurological: Negative for dizziness, light-headedness, numbness and headaches.  Hematological: Negative for adenopathy. Does not bruise/bleed easily.  Psychiatric/Behavioral: Negative for suicidal ideas, confusion, sleep disturbance, self-injury, dysphoric mood, decreased concentration and agitation.       Objective:    BP 116/80 mmHg  Pulse 99  Temp(Src) 98.7 F (37.1 C) (Oral)  Ht '5\' 3"'$  (1.6 m)  Wt 223 lb 9.6 oz (101.424 kg)  BMI 39.62 kg/m2  SpO2 99% General appearance: alert, cooperative, appears stated age and no distress Head: Normocephalic, without obvious abnormality, atraumatic Eyes:  conjunctivae/corneas clear. PERRL, EOM's intact. Fundi benign. Ears: normal TM's and external ear canals both ears Nose: Nares normal. Septum midline. Mucosa normal. No drainage or sinus tenderness. Throat: lips, mucosa, and tongue normal; teeth and gums normal Neck: no adenopathy, no carotid bruit, no JVD, supple, symmetrical, trachea midline and thyroid not enlarged, symmetric, no tenderness/mass/nodules Back: symmetric, no curvature. ROM normal. No CVA tenderness. Lungs: clear to auscultation bilaterally Breasts: gyn Heart: regular rate and rhythm, S1, S2 normal, no murmur, click, rub or gallop Abdomen: soft, non-tender; bowel sounds normal; no masses,  no organomegaly Pelvic: deferred--gyn Extremities: extremities normal, atraumatic, no cyanosis or edema Pulses: 2+ and symmetric Skin: Skin color, texture, turgor normal. No rashes or lesions Lymph nodes: Cervical, supraclavicular, and axillary nodes normal. Neurologic: Alert and oriented X 3, normal strength and tone. Normal symmetric reflexes. Normal coordination and gait Psych-- no depression, no anxiety      Assessment:    Healthy female exam.      Plan:  ghm utd Check labs See After Visit Summary for Counseling Recommendations    1. Gastroesophageal reflux disease, esophagitis presence not specified   - dexlansoprazole (DEXILANT) 60 MG capsule; Take 1 capsule (60 mg total) by mouth daily.  Dispense: 90 capsule; Refill: 3  2. Seasonal allergies   - fluticasone (FLONASE) 50 MCG/ACT nasal spray; Place 2 sprays into both nostrils daily.  Dispense: 16 g; Refill: 5  3. Preventative health care  See above - Comprehensive metabolic panel; Future - Lipid panel; Future - CBC with Differential/Platelet; Future - POCT urinalysis dipstick; Future - TSH; Future

## 2015-08-26 NOTE — Progress Notes (Signed)
Pre visit review using our clinic review tool, if applicable. No additional management support is needed unless otherwise documented below in the visit note. 

## 2015-08-26 NOTE — Patient Instructions (Addendum)
Preventive Care for Adults, Female A healthy lifestyle and preventive care can promote health and wellness. Preventive health guidelines for women include the following key practices.  A routine yearly physical is a good way to check with your health care provider about your health and preventive screening. It is a chance to share any concerns and updates on your health and to receive a thorough exam.  Visit your dentist for a routine exam and preventive care every 6 months. Brush your teeth twice a day and floss once a day. Good oral hygiene prevents tooth decay and gum disease.  The frequency of eye exams is based on your age, health, family medical history, use of contact lenses, and other factors. Follow your health care provider's recommendations for frequency of eye exams.  Eat a healthy diet. Foods like vegetables, fruits, whole grains, low-fat dairy products, and lean protein foods contain the nutrients you need without too many calories. Decrease your intake of foods high in solid fats, added sugars, and salt. Eat the right amount of calories for you.Get information about a proper diet from your health care provider, if necessary.  Regular physical exercise is one of the most important things you can do for your health. Most adults should get at least 150 minutes of moderate-intensity exercise (any activity that increases your heart rate and causes you to sweat) each week. In addition, most adults need muscle-strengthening exercises on 2 or more days a week.  Maintain a healthy weight. The body mass index (BMI) is a screening tool to identify possible weight problems. It provides an estimate of body fat based on height and weight. Your health care provider can find your BMI and can help you achieve or maintain a healthy weight.For adults 20 years and older:  A BMI below 18.5 is considered underweight.  A BMI of 18.5 to 24.9 is normal.  A BMI of 25 to 29.9 is considered overweight.  A  BMI of 30 and above is considered obese.  Maintain normal blood lipids and cholesterol levels by exercising and minimizing your intake of saturated fat. Eat a balanced diet with plenty of fruit and vegetables. Blood tests for lipids and cholesterol should begin at age 45 and be repeated every 5 years. If your lipid or cholesterol levels are high, you are over 50, or you are at high risk for heart disease, you may need your cholesterol levels checked more frequently.Ongoing high lipid and cholesterol levels should be treated with medicines if diet and exercise are not working.  If you smoke, find out from your health care provider how to quit. If you do not use tobacco, do not start.  Lung cancer screening is recommended for adults aged 45-80 years who are at high risk for developing lung cancer because of a history of smoking. A yearly low-dose CT scan of the lungs is recommended for people who have at least a 30-pack-year history of smoking and are a current smoker or have quit within the past 15 years. A pack year of smoking is smoking an average of 1 pack of cigarettes a day for 1 year (for example: 1 pack a day for 30 years or 2 packs a day for 15 years). Yearly screening should continue until the smoker has stopped smoking for at least 15 years. Yearly screening should be stopped for people who develop a health problem that would prevent them from having lung cancer treatment.  If you are pregnant, do not drink alcohol. If you are  breastfeeding, be very cautious about drinking alcohol. If you are not pregnant and choose to drink alcohol, do not have more than 1 drink per day. One drink is considered to be 12 ounces (355 mL) of beer, 5 ounces (148 mL) of wine, or 1.5 ounces (44 mL) of liquor.  Avoid use of street drugs. Do not share needles with anyone. Ask for help if you need support or instructions about stopping the use of drugs.  High blood pressure causes heart disease and increases the risk  of stroke. Your blood pressure should be checked at least every 1 to 2 years. Ongoing high blood pressure should be treated with medicines if weight loss and exercise do not work.  If you are 55-79 years old, ask your health care provider if you should take aspirin to prevent strokes.  Diabetes screening is done by taking a blood sample to check your blood glucose level after you have not eaten for a certain period of time (fasting). If you are not overweight and you do not have risk factors for diabetes, you should be screened once every 3 years starting at age 45. If you are overweight or obese and you are 40-70 years of age, you should be screened for diabetes every year as part of your cardiovascular risk assessment.  Breast cancer screening is essential preventive care for women. You should practice "breast self-awareness." This means understanding the normal appearance and feel of your breasts and may include breast self-examination. Any changes detected, no matter how small, should be reported to a health care provider. Women in their 20s and 30s should have a clinical breast exam (CBE) by a health care provider as part of a regular health exam every 1 to 3 years. After age 40, women should have a CBE every year. Starting at age 40, women should consider having a mammogram (breast X-ray test) every year. Women who have a family history of breast cancer should talk to their health care provider about genetic screening. Women at a high risk of breast cancer should talk to their health care providers about having an MRI and a mammogram every year.  Breast cancer gene (BRCA)-related cancer risk assessment is recommended for women who have family members with BRCA-related cancers. BRCA-related cancers include breast, ovarian, tubal, and peritoneal cancers. Having family members with these cancers may be associated with an increased risk for harmful changes (mutations) in the breast cancer genes BRCA1 and  BRCA2. Results of the assessment will determine the need for genetic counseling and BRCA1 and BRCA2 testing.  Your health care provider may recommend that you be screened regularly for cancer of the pelvic organs (ovaries, uterus, and vagina). This screening involves a pelvic examination, including checking for microscopic changes to the surface of your cervix (Pap test). You may be encouraged to have this screening done every 3 years, beginning at age 21.  For women ages 30-65, health care providers may recommend pelvic exams and Pap testing every 3 years, or they may recommend the Pap and pelvic exam, combined with testing for human papilloma virus (HPV), every 5 years. Some types of HPV increase your risk of cervical cancer. Testing for HPV may also be done on women of any age with unclear Pap test results.  Other health care providers may not recommend any screening for nonpregnant women who are considered low risk for pelvic cancer and who do not have symptoms. Ask your health care provider if a screening pelvic exam is right for   you.  If you have had past treatment for cervical cancer or a condition that could lead to cancer, you need Pap tests and screening for cancer for at least 20 years after your treatment. If Pap tests have been discontinued, your risk factors (such as having a new sexual partner) need to be reassessed to determine if screening should resume. Some women have medical problems that increase the chance of getting cervical cancer. In these cases, your health care provider may recommend more frequent screening and Pap tests.  Colorectal cancer can be detected and often prevented. Most routine colorectal cancer screening begins at the age of 50 years and continues through age 75 years. However, your health care provider may recommend screening at an earlier age if you have risk factors for colon cancer. On a yearly basis, your health care provider may provide home test kits to check  for hidden blood in the stool. Use of a small camera at the end of a tube, to directly examine the colon (sigmoidoscopy or colonoscopy), can detect the earliest forms of colorectal cancer. Talk to your health care provider about this at age 50, when routine screening begins. Direct exam of the colon should be repeated every 5-10 years through age 75 years, unless early forms of precancerous polyps or small growths are found.  People who are at an increased risk for hepatitis B should be screened for this virus. You are considered at high risk for hepatitis B if:  You were born in a country where hepatitis B occurs often. Talk with your health care provider about which countries are considered high risk.  Your parents were born in a high-risk country and you have not received a shot to protect against hepatitis B (hepatitis B vaccine).  You have HIV or AIDS.  You use needles to inject street drugs.  You live with, or have sex with, someone who has hepatitis B.  You get hemodialysis treatment.  You take certain medicines for conditions like cancer, organ transplantation, and autoimmune conditions.  Hepatitis C blood testing is recommended for all people born from 1945 through 1965 and any individual with known risks for hepatitis C.  Practice safe sex. Use condoms and avoid high-risk sexual practices to reduce the spread of sexually transmitted infections (STIs). STIs include gonorrhea, chlamydia, syphilis, trichomonas, herpes, HPV, and human immunodeficiency virus (HIV). Herpes, HIV, and HPV are viral illnesses that have no cure. They can result in disability, cancer, and death.  You should be screened for sexually transmitted illnesses (STIs) including gonorrhea and chlamydia if:  You are sexually active and are younger than 24 years.  You are older than 24 years and your health care provider tells you that you are at risk for this type of infection.  Your sexual activity has changed  since you were last screened and you are at an increased risk for chlamydia or gonorrhea. Ask your health care provider if you are at risk.  If you are at risk of being infected with HIV, it is recommended that you take a prescription medicine daily to prevent HIV infection. This is called preexposure prophylaxis (PrEP). You are considered at risk if:  You are sexually active and do not regularly use condoms or know the HIV status of your partner(s).  You take drugs by injection.  You are sexually active with a partner who has HIV.  Talk with your health care provider about whether you are at high risk of being infected with HIV. If   you choose to begin PrEP, you should first be tested for HIV. You should then be tested every 3 months for as long as you are taking PrEP.  Osteoporosis is a disease in which the bones lose minerals and strength with aging. This can result in serious bone fractures or breaks. The risk of osteoporosis can be identified using a bone density scan. Women ages 67 years and over and women at risk for fractures or osteoporosis should discuss screening with their health care providers. Ask your health care provider whether you should take a calcium supplement or vitamin D to reduce the rate of osteoporosis.  Menopause can be associated with physical symptoms and risks. Hormone replacement therapy is available to decrease symptoms and risks. You should talk to your health care provider about whether hormone replacement therapy is right for you.  Use sunscreen. Apply sunscreen liberally and repeatedly throughout the day. You should seek shade when your shadow is shorter than you. Protect yourself by wearing long sleeves, pants, a wide-brimmed hat, and sunglasses year round, whenever you are outdoors.  Once a month, do a whole body skin exam, using a mirror to look at the skin on your back. Tell your health care provider of new moles, moles that have irregular borders, moles that  are larger than a pencil eraser, or moles that have changed in shape or color.  Stay current with required vaccines (immunizations).  Influenza vaccine. All adults should be immunized every year.  Tetanus, diphtheria, and acellular pertussis (Td, Tdap) vaccine. Pregnant women should receive 1 dose of Tdap vaccine during each pregnancy. The dose should be obtained regardless of the length of time since the last dose. Immunization is preferred during the 27th-36th week of gestation. An adult who has not previously received Tdap or who does not know her vaccine status should receive 1 dose of Tdap. This initial dose should be followed by tetanus and diphtheria toxoids (Td) booster doses every 10 years. Adults with an unknown or incomplete history of completing a 3-dose immunization series with Td-containing vaccines should begin or complete a primary immunization series including a Tdap dose. Adults should receive a Td booster every 10 years.  Varicella vaccine. An adult without evidence of immunity to varicella should receive 2 doses or a second dose if she has previously received 1 dose. Pregnant females who do not have evidence of immunity should receive the first dose after pregnancy. This first dose should be obtained before leaving the health care facility. The second dose should be obtained 4-8 weeks after the first dose.  Human papillomavirus (HPV) vaccine. Females aged 13-26 years who have not received the vaccine previously should obtain the 3-dose series. The vaccine is not recommended for use in pregnant females. However, pregnancy testing is not needed before receiving a dose. If a female is found to be pregnant after receiving a dose, no treatment is needed. In that case, the remaining doses should be delayed until after the pregnancy. Immunization is recommended for any person with an immunocompromised condition through the age of 61 years if she did not get any or all doses earlier. During the  3-dose series, the second dose should be obtained 4-8 weeks after the first dose. The third dose should be obtained 24 weeks after the first dose and 16 weeks after the second dose.  Zoster vaccine. One dose is recommended for adults aged 30 years or older unless certain conditions are present.  Measles, mumps, and rubella (MMR) vaccine. Adults born  before 1957 generally are considered immune to measles and mumps. Adults born in 1957 or later should have 1 or more doses of MMR vaccine unless there is a contraindication to the vaccine or there is laboratory evidence of immunity to each of the three diseases. A routine second dose of MMR vaccine should be obtained at least 28 days after the first dose for students attending postsecondary schools, health care workers, or international travelers. People who received inactivated measles vaccine or an unknown type of measles vaccine during 1963-1967 should receive 2 doses of MMR vaccine. People who received inactivated mumps vaccine or an unknown type of mumps vaccine before 1979 and are at high risk for mumps infection should consider immunization with 2 doses of MMR vaccine. For females of childbearing age, rubella immunity should be determined. If there is no evidence of immunity, females who are not pregnant should be vaccinated. If there is no evidence of immunity, females who are pregnant should delay immunization until after pregnancy. Unvaccinated health care workers born before 1957 who lack laboratory evidence of measles, mumps, or rubella immunity or laboratory confirmation of disease should consider measles and mumps immunization with 2 doses of MMR vaccine or rubella immunization with 1 dose of MMR vaccine.  Pneumococcal 13-valent conjugate (PCV13) vaccine. When indicated, a person who is uncertain of his immunization history and has no record of immunization should receive the PCV13 vaccine. All adults 65 years of age and older should receive this  vaccine. An adult aged 19 years or older who has certain medical conditions and has not been previously immunized should receive 1 dose of PCV13 vaccine. This PCV13 should be followed with a dose of pneumococcal polysaccharide (PPSV23) vaccine. Adults who are at high risk for pneumococcal disease should obtain the PPSV23 vaccine at least 8 weeks after the dose of PCV13 vaccine. Adults older than 42 years of age who have normal immune system function should obtain the PPSV23 vaccine dose at least 1 year after the dose of PCV13 vaccine.  Pneumococcal polysaccharide (PPSV23) vaccine. When PCV13 is also indicated, PCV13 should be obtained first. All adults aged 65 years and older should be immunized. An adult younger than age 65 years who has certain medical conditions should be immunized. Any person who resides in a nursing home or long-term care facility should be immunized. An adult smoker should be immunized. People with an immunocompromised condition and certain other conditions should receive both PCV13 and PPSV23 vaccines. People with human immunodeficiency virus (HIV) infection should be immunized as soon as possible after diagnosis. Immunization during chemotherapy or radiation therapy should be avoided. Routine use of PPSV23 vaccine is not recommended for American Indians, [place] Natives, or people younger than 65 years unless there are medical conditions that require PPSV23 vaccine. When indicated, people who have unknown immunization and have no record of immunization should receive PPSV23 vaccine. One-time revaccination 5 years after the first dose of PPSV23 is recommended for people aged 19-64 years who have chronic kidney failure, nephrotic syndrome, asplenia, or immunocompromised conditions. People who received 1-2 doses of PPSV23 before age 65 years should receive another dose of PPSV23 vaccine at age 65 years or later if at least 5 years have passed since the previous dose. Doses of PPSV23 are not  needed for people immunized with PPSV23 at or after age 65 years.  Meningococcal vaccine. Adults with asplenia or persistent complement component deficiencies should receive 2 doses of quadrivalent meningococcal conjugate (MenACWY-D) vaccine. The doses should be obtained   at least 2 months apart. Microbiologists working with certain meningococcal bacteria, [place] recruits, people at risk during an outbreak, and people who travel to or live in countries with a high rate of meningitis should be immunized. A first-year college student up through age 34 years who is living in a residence hall should receive a dose if she did not receive a dose on or after her 16th birthday. Adults who have certain high-risk conditions should receive one or more doses of vaccine.  Hepatitis A vaccine. Adults who wish to be protected from this disease, have certain high-risk conditions, work with hepatitis A-infected animals, work in hepatitis A research labs, or travel to or work in countries with a high rate of hepatitis A should be immunized. Adults who were previously unvaccinated and who anticipate close contact with an international adoptee during the first 60 days after arrival in the [place] from a country with a high rate of hepatitis A should be immunized.  Hepatitis B vaccine. Adults who wish to be protected from this disease, have certain high-risk conditions, may be exposed to blood or other infectious body fluids, are household contacts or sex partners of hepatitis B positive people, are clients or workers in certain care facilities, or travel to or work in countries with a high rate of hepatitis B should be immunized.  Haemophilus influenzae type b (Hib) vaccine. A previously unvaccinated person with asplenia or sickle cell disease or having a scheduled splenectomy should receive 1 dose of Hib vaccine. Regardless of previous immunization, a recipient of a hematopoietic stem cell transplant should receive a  3-dose series 6-12 months after her successful transplant. Hib vaccine is not recommended for adults with HIV infection. Preventive Services / Frequency Ages 35 to 4 years  Blood pressure check.** / Every 3-5 years.  Lipid and cholesterol check.** / Every 5 years beginning at age 60.  Clinical breast exam.** / Every 3 years for women in their 71s and 10s.  BRCA-related cancer risk assessment.** / For women who have family members with a BRCA-related cancer (breast, ovarian, tubal, or peritoneal cancers).  Pap test.** / Every 2 years from ages 76 through 26. Every 3 years starting at age 61 through age 76 or 93 with a history of 3 consecutive normal Pap tests.  HPV screening.** / Every 3 years from ages 37 through ages 60 to 51 with a history of 3 consecutive normal Pap tests.  Hepatitis C blood test.** / For any individual with known risks for hepatitis C.  Skin self-exam. / Monthly.  Influenza vaccine. / Every year.  Tetanus, diphtheria, and acellular pertussis (Tdap, Td) vaccine.** / Consult your health care provider. Pregnant women should receive 1 dose of Tdap vaccine during each pregnancy. 1 dose of Td every 10 years.  Varicella vaccine.** / Consult your health care provider. Pregnant females who do not have evidence of immunity should receive the first dose after pregnancy.  HPV vaccine. / 3 doses over 6 months, if 93 and younger. The vaccine is not recommended for use in pregnant females. However, pregnancy testing is not needed before receiving a dose.  Measles, mumps, rubella (MMR) vaccine.** / You need at least 1 dose of MMR if you were born in 1957 or later. You may also need a 2nd dose. For females of childbearing age, rubella immunity should be determined. If there is no evidence of immunity, females who are not pregnant should be vaccinated. If there is no evidence of immunity, females who are  pregnant should delay immunization until after pregnancy.  Pneumococcal  13-valent conjugate (PCV13) vaccine.** / Consult your health care provider.  Pneumococcal polysaccharide (PPSV23) vaccine.** / 1 to 2 doses if you smoke cigarettes or if you have certain conditions.  Meningococcal vaccine.** / 1 dose if you are age 68 to 8 years and a Market researcher living in a residence hall, or have one of several medical conditions, you need to get vaccinated against meningococcal disease. You may also need additional booster doses.  Hepatitis A vaccine.** / Consult your health care provider.  Hepatitis B vaccine.** / Consult your health care provider.  Haemophilus influenzae type b (Hib) vaccine.** / Consult your health care provider. Ages 7 to 53 years  Blood pressure check.** / Every year.  Lipid and cholesterol check.** / Every 5 years beginning at age 25 years.  Lung cancer screening. / Every year if you are aged 11-80 years and have a 30-pack-year history of smoking and currently smoke or have quit within the past 15 years. Yearly screening is stopped once you have quit smoking for at least 15 years or develop a health problem that would prevent you from having lung cancer treatment.  Clinical breast exam.** / Every year after age 48 years.  BRCA-related cancer risk assessment.** / For women who have family members with a BRCA-related cancer (breast, ovarian, tubal, or peritoneal cancers).  Mammogram.** / Every year beginning at age 41 years and continuing for as long as you are in good health. Consult with your health care provider.  Pap test.** / Every 3 years starting at age 65 years through age 37 or 70 years with a history of 3 consecutive normal Pap tests.  HPV screening.** / Every 3 years from ages 72 years through ages 60 to 40 years with a history of 3 consecutive normal Pap tests.  Fecal occult blood test (FOBT) of stool. / Every year beginning at age 21 years and continuing until age 5 years. You may not need to do this test if you get  a colonoscopy every 10 years.  Flexible sigmoidoscopy or colonoscopy.** / Every 5 years for a flexible sigmoidoscopy or every 10 years for a colonoscopy beginning at age 35 years and continuing until age 48 years.  Hepatitis C blood test.** / For all people born from [birth year] through 1965 and any individual with known risks for hepatitis C.  Skin self-exam. / Monthly.  Influenza vaccine. / Every year.  Tetanus, diphtheria, and acellular pertussis (Tdap/Td) vaccine.** / Consult your health care provider. Pregnant women should receive 1 dose of Tdap vaccine during each pregnancy. 1 dose of Td every 10 years.  Varicella vaccine.** / Consult your health care provider. Pregnant females who do not have evidence of immunity should receive the first dose after pregnancy.  Zoster vaccine.** / 1 dose for adults aged 30 years or older.  Measles, mumps, rubella (MMR) vaccine.** / You need at least 1 dose of MMR if you were born in 1957 or later. You may also need a second dose. For females of childbearing age, rubella immunity should be determined. If there is no evidence of immunity, females who are not pregnant should be vaccinated. If there is no evidence of immunity, females who are pregnant should delay immunization until after pregnancy.  Pneumococcal 13-valent conjugate (PCV13) vaccine.** / Consult your health care provider.  Pneumococcal polysaccharide (PPSV23) vaccine.** / 1 to 2 doses if you smoke cigarettes or if you have certain conditions.  Meningococcal vaccine.** /  Consult your health care provider.  Hepatitis A vaccine.** / Consult your health care provider.  Hepatitis B vaccine.** / Consult your health care provider.  Haemophilus influenzae type b (Hib) vaccine.** / Consult your health care provider. Ages 65 years and over  Blood pressure check.** / Every year.  Lipid and cholesterol check.** / Every 5 years beginning at age 20 years.  Lung cancer screening. / Every year if you  are aged 55-80 years and have a 30-pack-year history of smoking and currently smoke or have quit within the past 15 years. Yearly screening is stopped once you have quit smoking for at least 15 years or develop a health problem that would prevent you from having lung cancer treatment.  Clinical breast exam.** / Every year after age 40 years.  BRCA-related cancer risk assessment.** / For women who have family members with a BRCA-related cancer (breast, ovarian, tubal, or peritoneal cancers).  Mammogram.** / Every year beginning at age 40 years and continuing for as long as you are in good health. Consult with your health care provider.  Pap test.** / Every 3 years starting at age 30 years through age 65 or 70 years with 3 consecutive normal Pap tests. Testing can be stopped between 65 and 70 years with 3 consecutive normal Pap tests and no abnormal Pap or HPV tests in the past 10 years.  HPV screening.** / Every 3 years from ages 30 years through ages 65 or 70 years with a history of 3 consecutive normal Pap tests. Testing can be stopped between 65 and 70 years with 3 consecutive normal Pap tests and no abnormal Pap or HPV tests in the past 10 years.  Fecal occult blood test (FOBT) of stool. / Every year beginning at age 50 years and continuing until age 75 years. You may not need to do this test if you get a colonoscopy every 10 years.  Flexible sigmoidoscopy or colonoscopy.** / Every 5 years for a flexible sigmoidoscopy or every 10 years for a colonoscopy beginning at age 50 years and continuing until age 75 years.  Hepatitis C blood test.** / For all people born from 1945 through 1965 and any individual with known risks for hepatitis C.  Osteoporosis screening.** / A one-time screening for women ages 65 years and over and women at risk for fractures or osteoporosis.  Skin self-exam. / Monthly.  Influenza vaccine. / Every year.  Tetanus, diphtheria, and acellular pertussis (Tdap/Td)  vaccine.** / 1 dose of Td every 10 years.  Varicella vaccine.** / Consult your health care provider.  Zoster vaccine.** / 1 dose for adults aged 60 years or older.  Pneumococcal 13-valent conjugate (PCV13) vaccine.** / Consult your health care provider.  Pneumococcal polysaccharide (PPSV23) vaccine.** / 1 dose for all adults aged 65 years and older.  Meningococcal vaccine.** / Consult your health care provider.  Hepatitis A vaccine.** / Consult your health care provider.  Hepatitis B vaccine.** / Consult your health care provider.  Haemophilus influenzae type b (Hib) vaccine.** / Consult your health care provider. ** Family history and personal history of risk and conditions may change your health care provider's recommendations.   This information is not intended to replace advice given to you by your health care provider. Make sure you discuss any questions you have with your health care provider.   Document Released: 06/27/2001 Document Revised: 05/22/2014 Document Reviewed: 09/26/2010 Elsevier Interactive Patient Education 2016 Elsevier Inc.  

## 2015-08-30 ENCOUNTER — Other Ambulatory Visit (INDEPENDENT_AMBULATORY_CARE_PROVIDER_SITE_OTHER): Payer: BC Managed Care – PPO

## 2015-08-30 DIAGNOSIS — Z0000 Encounter for general adult medical examination without abnormal findings: Secondary | ICD-10-CM | POA: Diagnosis not present

## 2015-08-30 LAB — LIPID PANEL
CHOL/HDL RATIO: 3
CHOLESTEROL: 150 mg/dL (ref 0–200)
HDL: 43.3 mg/dL (ref >39.00)
LDL CALC: 89 mg/dL (ref 0–99)
NonHDL: 106.94
TRIGLYCERIDES: 89 mg/dL (ref 0.0–149.0)
VLDL: 17.8 mg/dL (ref 0.0–40.0)

## 2015-08-30 LAB — CBC WITH DIFFERENTIAL/PLATELET
BASOS ABS: 0.1 10*3/uL (ref 0.0–0.1)
BASOS PCT: 0.9 % (ref 0.0–3.0)
EOS ABS: 0.2 10*3/uL (ref 0.0–0.7)
Eosinophils Relative: 2.8 % (ref 0.0–5.0)
HCT: 36.6 % (ref 36.0–46.0)
Hemoglobin: 12.4 g/dL (ref 12.0–15.0)
LYMPHS ABS: 2.1 10*3/uL (ref 0.7–4.0)
Lymphocytes Relative: 31.4 % (ref 12.0–46.0)
MCHC: 34 g/dL (ref 30.0–36.0)
MCV: 89.2 fl (ref 78.0–100.0)
Monocytes Absolute: 0.5 10*3/uL (ref 0.1–1.0)
Monocytes Relative: 7.9 % (ref 3.0–12.0)
NEUTROS ABS: 3.9 10*3/uL (ref 1.4–7.7)
NEUTROS PCT: 57 % (ref 43.0–77.0)
PLATELETS: 283 10*3/uL (ref 150.0–400.0)
RBC: 4.1 Mil/uL (ref 3.87–5.11)
RDW: 13.6 % (ref 11.5–15.5)
WBC: 6.8 10*3/uL (ref 4.0–10.5)

## 2015-08-30 LAB — COMPREHENSIVE METABOLIC PANEL
ALT: 11 U/L (ref 0–35)
AST: 16 U/L (ref 0–37)
Albumin: 4 g/dL (ref 3.5–5.2)
Alkaline Phosphatase: 65 U/L (ref 39–117)
BUN: 9 mg/dL (ref 6–23)
CALCIUM: 9.4 mg/dL (ref 8.4–10.5)
CHLORIDE: 106 meq/L (ref 96–112)
CO2: 26 meq/L (ref 19–32)
Creatinine, Ser: 0.97 mg/dL (ref 0.40–1.20)
GFR: 66.85 mL/min (ref >60.00)
Glucose, Bld: 91 mg/dL (ref 70–99)
POTASSIUM: 4.5 meq/L (ref 3.5–5.1)
Sodium: 142 mEq/L (ref 135–145)
Total Bilirubin: 0.4 mg/dL (ref 0.2–1.2)
Total Protein: 6.8 g/dL (ref 6.0–8.3)

## 2015-08-30 LAB — TSH: TSH: 5.13 u[IU]/mL — ABNORMAL HIGH (ref 0.35–4.50)

## 2015-09-01 ENCOUNTER — Encounter: Payer: Self-pay | Admitting: Family Medicine

## 2015-09-01 DIAGNOSIS — E039 Hypothyroidism, unspecified: Secondary | ICD-10-CM

## 2015-09-02 ENCOUNTER — Other Ambulatory Visit (INDEPENDENT_AMBULATORY_CARE_PROVIDER_SITE_OTHER): Payer: BC Managed Care – PPO

## 2015-09-02 DIAGNOSIS — E039 Hypothyroidism, unspecified: Secondary | ICD-10-CM | POA: Diagnosis not present

## 2015-09-03 LAB — T3, FREE: T3 FREE: 3.1 pg/mL (ref 2.3–4.2)

## 2015-09-03 LAB — TSH: TSH: 2.75 u[IU]/mL (ref 0.35–4.50)

## 2015-09-03 LAB — T4, FREE: FREE T4: 0.76 ng/dL (ref 0.60–1.60)

## 2015-09-07 ENCOUNTER — Encounter: Payer: Self-pay | Admitting: Family Medicine

## 2015-10-12 ENCOUNTER — Encounter: Payer: Self-pay | Admitting: Family Medicine

## 2015-10-12 ENCOUNTER — Ambulatory Visit (INDEPENDENT_AMBULATORY_CARE_PROVIDER_SITE_OTHER): Payer: BC Managed Care – PPO | Admitting: Family Medicine

## 2015-10-12 ENCOUNTER — Ambulatory Visit (HOSPITAL_BASED_OUTPATIENT_CLINIC_OR_DEPARTMENT_OTHER)
Admission: RE | Admit: 2015-10-12 | Discharge: 2015-10-12 | Disposition: A | Discharge status: Home / Self Care | Payer: BC Managed Care – PPO | Source: Ambulatory Visit | Attending: Family Medicine | Admitting: Family Medicine

## 2015-10-12 VITALS — BP 140/90 | HR 99 | Temp 98.5°F | Wt 213.0 lb

## 2015-10-12 DIAGNOSIS — R109 Unspecified abdominal pain: Secondary | ICD-10-CM

## 2015-10-12 DIAGNOSIS — R1010 Upper abdominal pain, unspecified: Secondary | ICD-10-CM

## 2015-10-12 LAB — POCT URINALYSIS DIPSTICK
Bilirubin, UA: NEGATIVE
Blood, UA: NEGATIVE
GLUCOSE UA: NEGATIVE
KETONES UA: NEGATIVE
Leukocytes, UA: NEGATIVE
NITRITE UA: NEGATIVE
Protein, UA: NEGATIVE
SPEC GRAV UA: 1.015
Urobilinogen, UA: 0.2
pH, UA: 6

## 2015-10-12 NOTE — Progress Notes (Signed)
Pre visit review using our clinic review tool, if applicable. No additional management support is needed unless otherwise documented below in the visit note. 

## 2015-10-12 NOTE — Patient Instructions (Signed)
Flank Pain °Flank pain refers to pain that is located on the side of the body between the upper abdomen and the back. The pain may occur over a short period of time (acute) or may be long-term or reoccurring (chronic). It may be mild or severe. Flank pain can be caused by many things. °CAUSES  °Some of the more common causes of flank pain include: °· Muscle strains.   °· Muscle spasms.   °· A disease of your spine (vertebral disk disease).   °· A lung infection (pneumonia).   °· Fluid around your lungs (pulmonary edema).   °· A kidney infection.   °· Kidney stones.   °· A very painful skin rash caused by the chickenpox virus (shingles).   °· Gallbladder disease.   °HOME CARE INSTRUCTIONS  °Home care will depend on the cause of your pain. In general, °· Rest as directed by your caregiver. °· Drink enough fluids to keep your urine clear or pale yellow. °· Only take over-the-counter or prescription medicines as directed by your caregiver. Some medicines may help relieve the pain. °· Tell your caregiver about any changes in your pain. °· Follow up with your caregiver as directed. °SEEK IMMEDIATE MEDICAL CARE IF:  °· Your pain is not controlled with medicine.   °· You have new or worsening symptoms. °· Your pain increases.   °· You have abdominal pain.   °· You have shortness of breath.   °· You have persistent nausea or vomiting.   °· You have swelling in your abdomen.   °· You feel faint or pass out.   °· You have blood in your urine. °· You have a fever or persistent symptoms for more than 2-3 days. °· You have a fever and your symptoms suddenly get worse. °MAKE SURE YOU:  °· Understand these instructions. °· Will watch your condition. °· Will get help right away if you are not doing well or get worse. °  °This information is not intended to replace advice given to you by your health care provider. Make sure you discuss any questions you have with your health care provider. °  °Document Released: 06/22/2005 Document  Revised: 01/24/2012 Document Reviewed: 12/14/2011 °Elsevier Interactive Patient Education ©2016 Elsevier Inc. ° °

## 2015-10-12 NOTE — Progress Notes (Signed)
Subjective:    Patient ID: [name], female    DOB: 1976/04/16, 40 y.o.   MRN: [id number]  Chief Complaint  Patient presents with  . Flank Pain    c/o right sided flank pain x's 1 mo that comes and goes    HPI Patient is in today for R flank pain x 1 month that comes and goes.  + constipation No fever, no nvd,  Pain is described as an ache that is worse with movement.    Past Medical History  Diagnosis Date  . Depression   . GERD (gastroesophageal reflux disease)   . RAD (reactive airway disease)   . Asthma   . Anxiety   . Gallstones     Past Surgical History  Procedure Laterality Date  . Cesarean section    . Cholecystectomy  2007    Family History  Problem Relation Age of Onset  . Breast cancer Mother   . Hypertension Mother   . Skin cancer Mother   . Pancreatic cancer Maternal Grandfather   . Arthritis Paternal Grandmother   . BRCA 1/2 Paternal Grandmother   . Hypertension Father   . Skin cancer Father   . BRCA 1/2 Cousin   . Diabetes Maternal Grandmother     Social History   Social History  . Marital Status: Married    Spouse Name: N/A  . Number of Children: 2  . 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 Smoking status: Never Smoker   . Smokeless tobacco: Never Used  . Alcohol Use: Yes     Comment: rare  . Drug Use: No  . Sexual Activity: Yes   Other Topics Concern  . Not on file   Social History Narrative    Outpatient Prescriptions Prior to Visit  Medication Sig Dispense Refill  . albuterol (PROAIR HFA) 108 (90 BASE) MCG/ACT inhaler INHALE 2 PUFFS FOUR TIMES DAILY AS NEEDED 8.5 g 1  . ALPRAZolam (XANAX) 0.5 MG tablet Take 0.5 mg by mouth 2 (two) times daily as needed.      . cetirizine (ZYRTEC) 10 MG tablet Take 10 mg by mouth daily. Seasonal    . dexlansoprazole (DEXILANT) 60 MG capsule Take 1 capsule (60 mg total) by mouth daily. 90 capsule 3  . fluticasone  (FLONASE) 50 MCG/ACT nasal spray Place 2 sprays into both nostrils daily. 16 g 5  . levonorgestrel (MIRENA) 20 MCG/24HR IUD 1 each by Intrauterine route once.    . Vilazodone HCl (VIIBRYD) 40 MG TABS Take 40 mg by mouth daily.    . phentermine (ADIPEX-P) 37.5 MG tablet Take 1 tablet by mouth daily.     No facility-administered medications prior to visit.    Allergies  Allergen Reactions  . Amoxicillin Hives    Kidney Disease  . Labetalol Hcl     Asthma  . Sulfonamide Derivatives Hives    Review of Systems  Constitutional: Negative for fever and malaise/fatigue.  HENT: Negative for congestion.   Eyes: Negative for blurred vision.  Respiratory: Negative for shortness of breath.   Cardiovascular: Negative for chest pain, palpitations and leg swelling.  Gastrointestinal: Negative for nausea, abdominal pain and blood in stool.  Genitourinary: Positive for flank pain. Negative for dysuria, urgency, frequency and hematuria.  Musculoskeletal: Negative for falls.  Skin: Negative for rash.  Neurological: Negative for dizziness, loss of consciousness and headaches.  Endo/Heme/Allergies: Negative for environmental allergies.  Psychiatric/Behavioral: Negative for depression. The patient is not nervous/anxious.        Objective:    Physical Exam  Constitutional: She is oriented to person, place, and time. She appears well-developed and well-nourished.  HENT:  Head: Normocephalic and atraumatic.  Eyes: Conjunctivae and EOM are normal.  Neck: Normal range of motion. Neck supple. No JVD present. Carotid bruit is not present. No thyromegaly present.  Cardiovascular: Normal rate, regular rhythm and normal heart sounds.   No murmur heard. Pulmonary/Chest: Effort normal and breath sounds normal. No respiratory distress. She has no wheezes. She has no rales. She exhibits no tenderness.  Abdominal: She exhibits no mass. There is tenderness. There is no rebound and no guarding.     Musculoskeletal: She exhibits no edema.  Neurological: She is alert and oriented to person, place, and time.  Skin:     Psychiatric: She has a normal mood and affect.  Nursing note and vitals reviewed.   BP 140/90 mmHg  Pulse 99  Temp(Src) 98.5 F (36.9 C) (Oral)  Wt 213 lb (96.616 kg)  SpO2 99% Wt Readings from Last 3 Encounters:  10/12/15 213 lb (96.616 kg)  08/26/15 223 lb 9.6 oz (101.424 kg)  06/23/15 229 lb 3.2 oz (103.964 kg)     Lab Results  Component Value Date   WBC 6.8 08/30/2015   HGB 12.4 08/30/2015   HCT 36.6 08/30/2015   PLT 283.0 08/30/2015   GLUCOSE 91 08/30/2015   CHOL 150 08/30/2015   TRIG 89.0 08/30/2015   HDL 43.30 08/30/2015   LDLDIRECT 154.3 07/28/2010   LDLCALC 89 08/30/2015   ALT 11 08/30/2015   AST 16 08/30/2015   NA 142 08/30/2015   K 4.5 08/30/2015   CL 106 08/30/2015   CREATININE 0.97 08/30/2015   BUN 9 08/30/2015   CO2 26 08/30/2015   TSH 2.75 09/02/2015    Lab Results  Component Value Date   TSH 2.75 09/02/2015   Lab Results  Component Value Date   WBC 6.8 08/30/2015   HGB 12.4 08/30/2015   HCT 36.6 08/30/2015   MCV 89.2 08/30/2015   PLT 283.0 08/30/2015   Lab Results  Component Value Date   NA 142 08/30/2015   K 4.5 08/30/2015   CO2 26 08/30/2015   GLUCOSE 91 08/30/2015   BUN 9 08/30/2015   CREATININE 0.97 08/30/2015   BILITOT 0.4 08/30/2015   ALKPHOS 65 08/30/2015   AST 16 08/30/2015   ALT 11 08/30/2015   PROT 6.8 08/30/2015   ALBUMIN 4.0 08/30/2015   CALCIUM 9.4 08/30/2015   GFR 66.85 08/30/2015   Lab Results  Component Value Date   CHOL 150 08/30/2015   Lab Results  Component Value Date   HDL 43.30 08/30/2015   Lab Results  Component Value Date   LDLCALC 89 08/30/2015   Lab Results  Component Value Date   TRIG 89.0 08/30/2015   Lab Results  Component Value Date   CHOLHDL 3 08/30/2015   No results found for: HGBA1C     Assessment & Plan:   Problem List Items Addressed This Visit     None    Visit Diagnoses    Flank pain, acute    -  Primary    Relevant Orders    POCT urinalysis dipstick (Completed)    DG Abd 1 View (Completed)     >? Constipation vs MS Check xray and ua  I have discontinued Ms. Byard's phentermine. I am also having her maintain  her ALPRAZolam, cetirizine, albuterol, levonorgestrel, Vilazodone HCl, dexlansoprazole, and fluticasone.  No orders of the defined types were placed in this encounter.     Rosalita Chessman Chase, DO+ +

## 2016-02-15 ENCOUNTER — Other Ambulatory Visit: Payer: Self-pay

## 2016-02-15 ENCOUNTER — Ambulatory Visit: Payer: BC Managed Care – PPO | Admitting: Gastroenterology

## 2016-02-22 ENCOUNTER — Encounter: Payer: Self-pay | Admitting: Family Medicine

## 2016-03-07 ENCOUNTER — Ambulatory Visit: Payer: BC Managed Care – PPO | Admitting: Family Medicine

## 2016-03-24 ENCOUNTER — Other Ambulatory Visit: Payer: Self-pay | Admitting: Emergency Medicine

## 2016-03-24 DIAGNOSIS — J302 Other seasonal allergic rhinitis: Secondary | ICD-10-CM

## 2016-03-24 MED ORDER — FLUTICASONE PROPIONATE 50 MCG/ACT NA SUSP: 2.0000 | Freq: Every day | NASAL | 1 refills | Status: DC

## 2016-04-10 ENCOUNTER — Ambulatory Visit: Payer: Self-pay | Admitting: Family Medicine

## 2016-05-30 ENCOUNTER — Ambulatory Visit: Payer: Self-pay | Admitting: Family Medicine

## 2016-06-22 ENCOUNTER — Other Ambulatory Visit (HOSPITAL_COMMUNITY): Payer: Self-pay | Admitting: General Surgery

## 2016-06-26 ENCOUNTER — Ambulatory Visit (INDEPENDENT_AMBULATORY_CARE_PROVIDER_SITE_OTHER): Payer: BC Managed Care – PPO | Admitting: Family Medicine

## 2016-06-26 ENCOUNTER — Encounter: Payer: Self-pay | Admitting: Family Medicine

## 2016-06-26 DIAGNOSIS — K219 Gastro-esophageal reflux disease without esophagitis: Secondary | ICD-10-CM | POA: Diagnosis not present

## 2016-06-26 MED ORDER — LIRAGLUTIDE -WEIGHT MANAGEMENT 18 MG/3ML ~~LOC~~ SOPN
3.0000 mg | PEN_INJECTOR | Freq: Every day | SUBCUTANEOUS | 5 refills | Status: DC
Start: 2016-06-26 — End: 2016-08-29

## 2016-06-26 NOTE — Progress Notes (Signed)
Subjective:    Patient ID: [name], female    DOB: 08-04-1975, 41 y.o.   MRN: [id number]  Chief Complaint  Patient presents with  . surgical clearance    gastric bypass     HPI Patient is in today for surgical clearance for gastric bypass.   She still has not decided to go through with surgery and would like to try losing weight first.    Past Medical History:  Diagnosis Date  . Anxiety   . Asthma   . Depression   . Gallstones   . GERD (gastroesophageal reflux disease)   . RAD (reactive airway disease)     Past Surgical History:  Procedure Laterality Date  . CESAREAN SECTION    . CHOLECYSTECTOMY  2007    Family History  Problem Relation Age of Onset  . Breast cancer Mother   . Hypertension Mother   . Skin cancer Mother   . Pancreatic cancer Maternal Grandfather   . Arthritis Paternal Grandmother   . BRCA 1/2 Paternal Grandmother   . Hypertension Father   . Skin cancer Father   . BRCA 1/2 Cousin   . Diabetes Maternal Grandmother     Social History   Social History  . Marital status: Married    Spouse name: N/A  . Number of children: 2  . Years of education: N/A   Occupational History  . 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 Smoking status: Never Smoker  . Smokeless tobacco: Never Used  . Alcohol use Yes     Comment: rare  . Drug use: No  . Sexual activity: Yes   Other Topics Concern  . Not on file   Social History Narrative  . No narrative on file    Outpatient Medications Prior to Visit  Medication Sig Dispense Refill  . albuterol (PROAIR HFA) 108 (90 BASE) MCG/ACT inhaler INHALE 2 PUFFS FOUR TIMES DAILY AS NEEDED 8.5 g 1  . ALPRAZolam (XANAX) 0.5 MG tablet Take 0.5 mg by mouth 2 (two) times daily as needed.      . cetirizine (ZYRTEC) 10 MG tablet Take 10 mg by mouth daily. Seasonal    . dexlansoprazole (DEXILANT) 60 MG capsule Take 1 capsule (60 mg total) by mouth daily. 90 capsule 3  . fluticasone (FLONASE) 50  MCG/ACT nasal spray Place 2 sprays into both nostrils daily. 48 g 1  . levonorgestrel (MIRENA) 20 MCG/24HR IUD 1 each by Intrauterine route once.    . Vilazodone HCl (VIIBRYD) 40 MG TABS Take 40 mg by mouth daily.     No facility-administered medications prior to visit.     Allergies  Allergen Reactions  . Amoxicillin Hives    Kidney Disease  . Labetalol Hcl     Asthma  . Sulfonamide Derivatives Hives    Review of Systems  Constitutional: Negative for chills, fever and malaise/fatigue.  HENT: Negative for congestion and hearing loss.   Eyes: Negative for discharge.  Respiratory: Negative for cough, sputum production and shortness of breath.   Cardiovascular: Negative for chest pain, palpitations and leg swelling.  Gastrointestinal: Negative for abdominal pain, blood in stool, constipation, diarrhea, heartburn, nausea and vomiting.  Genitourinary: Negative for dysuria, frequency, hematuria and urgency.  Musculoskeletal: Negative for back pain, falls and myalgias.  Skin: Negative for rash.  Neurological: Negative for dizziness, sensory change, loss of consciousness, weakness and headaches.  Endo/Heme/Allergies: Negative for environmental allergies. Does not bruise/bleed easily.  Psychiatric/Behavioral: Negative for  depression and suicidal ideas. The patient is not nervous/anxious and does not have insomnia.        Objective:    Physical Exam  Constitutional: She is oriented to person, place, and time. She appears well-developed and well-nourished.  HENT:  Head: Normocephalic and atraumatic.  Eyes: Conjunctivae and EOM are normal.  Neck: Normal range of motion. Neck supple. No JVD present. Carotid bruit is not present. No thyromegaly present.  Cardiovascular: Normal rate, regular rhythm and normal heart sounds.   No murmur heard. Pulmonary/Chest: Effort normal and breath sounds normal. No respiratory distress. She has no wheezes. She has no rales. She exhibits no tenderness.    Musculoskeletal: She exhibits no edema.  Neurological: She is alert and oriented to person, place, and time.  Psychiatric: She has a normal mood and affect.  Nursing note and vitals reviewed.   BP (!) 138/93   Pulse 86   Temp 98.1 F (36.7 C) (Oral)   Resp 16   Ht 5' 4.5" (1.638 m)   Wt 239 lb 3.2 oz (108.5 kg)   SpO2 95%   BMI 40.42 kg/m  Wt Readings from Last 3 Encounters:  06/26/16 239 lb 3.2 oz (108.5 kg)  10/12/15 213 lb (96.6 kg)  08/26/15 223 lb 9.6 oz (101.4 kg)     Lab Results  Component Value Date   WBC 6.8 08/30/2015   HGB 12.4 08/30/2015   HCT 36.6 08/30/2015   PLT 283.0 08/30/2015   GLUCOSE 91 08/30/2015   CHOL 150 08/30/2015   TRIG 89.0 08/30/2015   HDL 43.30 08/30/2015   LDLDIRECT 154.3 07/28/2010   LDLCALC 89 08/30/2015   ALT 11 08/30/2015   AST 16 08/30/2015   NA 142 08/30/2015   K 4.5 08/30/2015   CL 106 08/30/2015   CREATININE 0.97 08/30/2015   BUN 9 08/30/2015   CO2 26 08/30/2015   TSH 2.75 09/02/2015    Lab Results  Component Value Date   TSH 2.75 09/02/2015   Lab Results  Component Value Date   WBC 6.8 08/30/2015   HGB 12.4 08/30/2015   HCT 36.6 08/30/2015   MCV 89.2 08/30/2015   PLT 283.0 08/30/2015   Lab Results  Component Value Date   NA 142 08/30/2015   K 4.5 08/30/2015   CO2 26 08/30/2015   GLUCOSE 91 08/30/2015   BUN 9 08/30/2015   CREATININE 0.97 08/30/2015   BILITOT 0.4 08/30/2015   ALKPHOS 65 08/30/2015   AST 16 08/30/2015   ALT 11 08/30/2015   PROT 6.8 08/30/2015   ALBUMIN 4.0 08/30/2015   CALCIUM 9.4 08/30/2015   GFR 66.85 08/30/2015   Lab Results  Component Value Date   CHOL 150 08/30/2015   Lab Results  Component Value Date   HDL 43.30 08/30/2015   Lab Results  Component Value Date   LDLCALC 89 08/30/2015   Lab Results  Component Value Date   TRIG 89.0 08/30/2015   Lab Results  Component Value Date   CHOLHDL 3 08/30/2015   No results found for: HGBA1C     Assessment & Plan:    Problem List Items Addressed This Visit      Unprioritized   GERD    Other Visit Diagnoses    Morbid obesity ([place])    -  Primary   Relevant Medications   Liraglutide -Weight Management (SAXENDA) 18 MG/3ML SOPN    con't diet and exercise rto 3 months or sooner prn   I am having [name]  start on Liraglutide -Weight Management. I am also having her maintain her ALPRAZolam, cetirizine, albuterol, levonorgestrel, Vilazodone HCl, dexlansoprazole, fluticasone, triamcinolone cream, hydroquinone, and tretinoin.  Meds ordered this encounter  Medications  . triamcinolone cream (KENALOG) 0.1 %  . hydroquinone 4 % cream    Sig: APPLY TO AFFECTED AREA TWICE A DAY    Refill:  3  . tretinoin (RETIN-A) 0.05 % cream    Sig: APPLY TO AFFECTED AREA AT BEDTIME    Refill:  3  . Liraglutide -Weight Management (SAXENDA) 18 MG/3ML SOPN    Sig: Inject 3 mg into the skin daily.    Dispense:  3 mL    Refill:  5    CMA served as scribe during this visit. History, Physical and Plan performed by medical provider. Documentation and orders reviewed and attested to.  [name], DO

## 2016-06-26 NOTE — Progress Notes (Signed)
Pre visit review using our clinic review tool, if applicable. No additional management support is needed unless otherwise documented below in the visit note. 

## 2016-06-26 NOTE — Patient Instructions (Signed)

## 2016-07-05 ENCOUNTER — Encounter: Payer: Self-pay | Admitting: Skilled Nursing Facility1

## 2016-07-05 ENCOUNTER — Encounter: Payer: BC Managed Care – PPO | Attending: General Surgery | Admitting: Skilled Nursing Facility1

## 2016-07-05 DIAGNOSIS — Z6841 Body Mass Index (BMI) 40.0 and over, adult: Secondary | ICD-10-CM | POA: Diagnosis not present

## 2016-07-05 DIAGNOSIS — Z713 Dietary counseling and surveillance: Secondary | ICD-10-CM | POA: Diagnosis not present

## 2016-07-05 DIAGNOSIS — E669 Obesity, unspecified: Secondary | ICD-10-CM

## 2016-07-05 NOTE — Progress Notes (Signed)
Pre-Op Assessment Visit:  Pre-Operative Roux-En-Y Bypass Surgery  Medical Nutrition Therapy:  Appt start time: 4:30  End time:  5:30  Patient was seen on 07/05/2016 for Pre-Operative Nutrition Assessment. Assessment and [facility] coordinator on 07/05/2016.   Pt states she still considers this fact finding and is not completely sure she wants surgery. Pt states her Husband was diagnosed with bone cancer. Pt states she avoids beef and pork.   Start weight at [facility]: 235.8 BMI: 42.44  24 hr Dietary Recall: First Meal: protein shake and banana  Snack: popcorn---candy Second Meal: smoothie Snack: protein bar Third Meal: pizza Snack: chips or cheesestick Beverages: water, coke zero, hot tea  Encouraged to engage in 150 minutes of moderate physical activity including cardiovascular and weight baring weekly  Handouts given during visit include:  . Pre-Op Goals . Bariatric Surgery Protein Shakes During the appointment today the following Pre-Op Goals were reviewed with the patient: . Maintain or lose weight as instructed by your surgeon . Make healthy food choices . Begin to limit portion sizes . Limited concentrated sugars and fried foods . Keep fat/sugar in the single digits per serving on          food labels . Practice CHEWING your food  (aim for 30 chews per bite or until applesauce consistency) . Practice not drinking 15 minutes before, during, and 30 minutes after each meal/snack . Avoid all carbonated beverages  . Avoid/limit caffeinated beverages  . Avoid all sugar-sweetened beverages . Consume 3 meals per day; eat every 3-5 hours . Make a list of non-food related activities . Aim for 64-100 ounces of FLUID daily  . Aim for at least 60-80 grams of PROTEIN daily . Look for a liquid protein source that contain ?15 g protein and ?5 g carbohydrate  (ex: shakes, drinks, shots)  -Follow diet recommendations listed  below   Energy and Macronutrient Recomendations: Calories: 1500 Carbohydrate: 170 Protein: 112 Fat: 42  Demonstrated degree of understanding via:  Teach Back  Teaching Method Utilized:  Visual Auditory Hands on  Barriers to learning/adherence to lifestyle change: none identified   Patient to call the Nutrition [facility] Education Services to enroll in Pre-Op and Post-Op Nutrition Education when surgery date is scheduled.

## 2016-07-05 NOTE — Patient Instructions (Signed)
Follow Pre-Op Goals Try Protein Shakes Call [facility] at [phone] when surgery is scheduled to enroll in Pre-Op Class  Things to remember:  Please always be honest with us. We want to support you!  If you have any questions or concerns in between appointments, please call or email   The diet after surgery will be high protein and low in carbohydrate.  Vitamins and calcium need to be taken for the rest of your life.  Feel free to include support people in any classes or appointments.   Supplement recommendations:  Before Surgery   1 Complete Multivitamin with Iron  3000 IU Vitamin D3  After Surgery   2 Chewable Multivitamins  **Best Choice - Bariatric Advantage Advanced Multi EA      3 Chewable Calcium (500 mg each, total 1200-1500 mg per day)  **Best Choice - Celebrate, Bariatric Advantage, or Wellesse  Other Options:    2 Flinstones Complete + up to 100 mg Thiamin + 2000-3000 IU Vitamin D3 + 350-500 mcg Vitamin B12 + 30-45 mg Iron (with history of deficiency)  2 Celebrate MultiComplete with 18 mg Iron (this provides 6000 IU of  Vitamin D3)  4 Celebrate Essential Multi 2 in 1 (has calcium) + 18-60 mg separate  iron  Vitamins and Calcium are available at:   [facility]   [address], [place], [place] [address]   [email or web address]  [email or web address]  www.[email or web address]    

## 2016-07-12 ENCOUNTER — Ambulatory Visit (HOSPITAL_COMMUNITY)
Admission: RE | Admit: 2016-07-12 | Discharge: 2016-07-12 | Disposition: A | Discharge status: Home / Self Care | Payer: BC Managed Care – PPO | Source: Ambulatory Visit | Attending: General Surgery | Admitting: General Surgery

## 2016-07-12 ENCOUNTER — Other Ambulatory Visit: Payer: Self-pay

## 2016-07-12 DIAGNOSIS — Z01818 Encounter for other preprocedural examination: Secondary | ICD-10-CM | POA: Insufficient documentation

## 2016-07-26 ENCOUNTER — Ambulatory Visit: Payer: BC Managed Care – PPO | Admitting: Skilled Nursing Facility1

## 2016-07-27 ENCOUNTER — Other Ambulatory Visit: Payer: Self-pay | Admitting: Family Medicine

## 2016-07-27 DIAGNOSIS — K219 Gastro-esophageal reflux disease without esophagitis: Secondary | ICD-10-CM

## 2016-07-31 ENCOUNTER — Encounter: Payer: Self-pay | Admitting: Skilled Nursing Facility1

## 2016-07-31 ENCOUNTER — Encounter: Payer: BC Managed Care – PPO | Attending: General Surgery | Admitting: Skilled Nursing Facility1

## 2016-07-31 DIAGNOSIS — Z6841 Body Mass Index (BMI) 40.0 and over, adult: Secondary | ICD-10-CM | POA: Diagnosis not present

## 2016-07-31 DIAGNOSIS — E669 Obesity, unspecified: Secondary | ICD-10-CM

## 2016-07-31 DIAGNOSIS — Z713 Dietary counseling and surveillance: Secondary | ICD-10-CM | POA: Insufficient documentation

## 2016-07-31 NOTE — Progress Notes (Signed)
  Pre-Operative Nutrition Class:  Appt start time: 7510   End time:  1830.  Patient was seen on 07/31/16 for Pre-Operative Bariatric Surgery Education at [facility].   Surgery date:  Surgery type: RYGB Start weight at [facility]:  Weight today: 235.13  [name]  BODY COMP RESULTS     BMI (kg/m^2) N/A   Fat Mass (lbs)    Fat Free Mass (lbs)    Total Body Water (lbs)    Samples given per MNT protocol. Patient educated on appropriate usage: Bariatric Advantage Multivitamin Lot # [id number] Exp: 6/19  Bariatric Advantage Calcium Citrate Lot # [id number] Exp: 10/20/16  Renee Pain Protein Shake Lot # [id number] Exp: 03/14/17   The following the learning objectives were met by the patient during this course:  Identify Pre-Op Dietary Goals and will begin 2 weeks pre-operatively  Identify appropriate sources of fluids and proteins   State protein recommendations and appropriate sources pre and post-operatively  Identify Post-Operative Dietary Goals and will follow for 2 weeks post-operatively  Identify appropriate multivitamin and calcium sources  Describe the need for physical activity post-operatively and will follow MD recommendations  State when to call healthcare provider regarding medication questions or post-operative complications  Handouts given during class include:  Pre-Op Bariatric Surgery Diet Handout  Protein Shake Handout  Post-Op Bariatric Surgery Nutrition Handout  BELT Program Information Flyer  Support Group Information Flyer  [facility] Bariatric Supplements Price List  Follow-Up Plan: Patient will follow-up at [facility] 2 weeks post operatively for diet advancement per MD.

## 2016-08-02 ENCOUNTER — Ambulatory Visit: Payer: BC Managed Care – PPO | Admitting: Skilled Nursing Facility1

## 2016-08-04 NOTE — Progress Notes (Signed)
Need orders for 4-16 surgery pre op is 4-12

## 2016-08-16 ENCOUNTER — Ambulatory Visit: Payer: Self-pay | Admitting: General Surgery

## 2016-08-16 NOTE — H&P (Signed)
[name] 08/16/2016 11:02 AM Location: [facility] Patient #: [id number] DOB: 01/23/1976 Married / Language: English / Race: White Female  History of Present Illness ([name] M. Sabrina Bouchillon MD; 08/16/2016 11:28 AM) The patient is a 41 year old female who presents for a bariatric surgery evaluation. She comes in today for preoperative appointment. She has been approved for laparoscopic Roux-en-Y gastric bypass. She denies any medical changes since I initially saw her. She denies any trips the emergency room or hospital. She has started her preoperative meal plan. She denies any chest pain or chest pressure. She denies any shorts breath. She denies any tobacco use. Her preoperative workup was unremarkable. There is no surprises with her reoperative blood work. There were no vitamin deficiencies. Chest x-ray and upper GI were within normal limits.   28/18 She is referred by Dr Yvonne Lowne for evaluation of weight loss surgery. She is interested in pursuing weight loss surgery. She wants to get a handle on her health. She is debating between the sleeve gastrectomy and gastric bypass. She did our online seminar. Her husband was diagnosed with bone cancer about a year ago and is currently engaged in chemotherapy. His health issues have made her reevaluate her approach to her lifestyle and try to get a handle on her weight. She did see a bariatrician for 6 months last year and was able to lose 60 pounds but has so far regain 30 pounds of it back. She has also tried phentermine as well as qysmia.  Her comorbidities include gastroesophageal reflux, chronic left knee pain, degenerative disc disease of the lumbar spine  She is somewhat interested in the sleeve but is concerned about potential reflux issues after sleeve gastrectomy  She denies any chest pain, chest pressure, shortness of breath, dyspnea on exertion, orthopnea, TIAs or amaurosis fugax. She denies any personal or family  history of blood clots. Her stop bang score was 2.  She does have heartburn. She takes medication daily. She has had a cholecystectomy in 2 C-sections. She has daily bowel movements. She denies any melena or hematochezia. She denies any early satiety or pain with eating or drinking. She has an IUD in. Her children are ages 9 and 12. She denies any dysuria hematuria. She does have chronic left knee pain. She also has lower back pain. She states that she had an x-ray last year which showed degenerative changes in her lower spine. She does have some sciatica radiating to bilateral mid thighs. She denies any migraines, tobacco, alcohol or drug use.   Problem List/Past Medical ([name] M. Sabrina Beed, MD; 08/16/2016 11:28 AM) GASTROESOPHAGEAL REFLUX DISEASE WITHOUT ESOPHAGITIS (K21.9) OBESITY, MORBID, BMI 40.0-49.9 (E66.01)  Past Surgical History ([name] M. Sabrina Kinlaw, MD; 08/16/2016 11:28 AM) Cesarean Section - Multiple Gallbladder Surgery - Laparoscopic  Diagnostic Studies History ([name] M. Sabrina Retherford, MD; 08/16/2016 11:28 AM) Colonoscopy never Mammogram within last year Pap Smear 1-5 years ago  Allergies ([name] M. Sabrina Brimage, MD; 08/16/2016 11:28 AM) Amoxicillin-Pot Clavulanate *CHEMICALS* Hives Labetalol HCl *CHEMICALS* Hives Sulfonamide Derivatives Hives Allergies Reconciled  Medication History ([name] M. Sabrina Ishida, MD; 08/16/2016 11:28 AM) Medications Reconciled OxyCODONE HCl (5MG/5ML Solution, 5-10 Milliliter Oral every four hours, as needed, Taken starting 08/16/2016) Active. Zofran ODT (4MG Tablet Disint, 1 (one) Tablet Disperse Oral every six hours, as needed, Taken starting 08/16/2016) Active. Albuterol Sulfate HFA (108 (90 Base)MCG/ACT Aerosol Soln, Inhalation as needed) Active. ALPRAZolam (0.5MG Tablet, Oral daily) Active. ZyrTEC (10MG Tablet, Oral as needed) Active. Fluticasone Propionate (50MCG/ACT Suspension, Nasal as   needed) Active. Mirena (52 MG) (20MCG/24HR IUD, Intrauterine)  Active. Vilazodone HCl (  Tablet, Oral daily) Active.  Social History [name] M. Sabrina Campanile, MD; 08/16/2016 11:28 AM) Alcohol use Occasional alcohol use. Caffeine use Carbonated beverages, Tea. No drug use Tobacco use Never smoker.  Family History [name] M. Sabrina Campanile, MD; 08/16/2016 11:28 AM) Arthritis Father, Mother. Breast Cancer Mother. Hypertension Father.  Pregnancy / Birth History [name] M. Sabrina Campanile, MD; 08/16/2016 11:28 AM) Age at menarche 11 years. Contraceptive History Intrauterine device. Gravida 3 Maternal age 80-30 Para 2 Regular periods  Other Problems [name] M. 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 [name] MD; 08/16/2016 11:27 AM)  General Mental Status-Alert. General Appearance-Consistent with stated age. Hydration-Well hydrated. Voice-Normal. Note: morbidly obese. mostly evenly distributed  Head and Neck Head-normocephalic, atraumatic with no lesions or palpable masses. Trachea-midline. Thyroid Gland Characteristics - normal size and consistency.  Eye Eyeball - Bilateral-Extraocular movements intact. Sclera/Conjunctiva - Bilateral-No scleral icterus.  ENMT Note: normal dentition, normal ext ears  Chest and Lung Exam Chest and lung exam reveals -quiet, even and easy respiratory effort with no use of accessory muscles and on auscultation, normal breath sounds, no adventitious sounds and normal vocal resonance. Inspection Chest Wall - Normal. Back - normal.  Breast - Did not  examine.  Cardiovascular Cardiovascular examination reveals -normal heart sounds, regular rate and rhythm with no murmurs and normal pedal pulses bilaterally.  Abdomen Inspection Inspection of the abdomen reveals - No Hernias. Skin - Scar - no surgical scars. Palpation/Percussion Palpation and Percussion of the abdomen reveal - Soft, Non Tender, No Rebound tenderness, No Rigidity (guarding) and No hepatosplenomegaly. Auscultation Auscultation of the abdomen reveals - Bowel sounds normal.  Peripheral Vascular Upper Extremity Palpation - Pulses bilaterally normal.  Neurologic Neurologic evaluation reveals -alert and oriented x 3 with no impairment of recent or remote memory. Mental Status-Normal.  Neuropsychiatric The patient's mood and affect are described as -normal. Judgment and Insight-insight is appropriate concerning matters relevant to self.  Musculoskeletal Normal Exam - Left-Upper Extremity Strength Normal and Lower Extremity Strength Normal. Normal Exam - Right-Upper Extremity Strength Normal and Lower Extremity Strength Normal. Note: b/l crepitus R>L  Lymphatic Head & Neck  General Head & Neck Lymphatics: Bilateral - Description - Normal. Axillary - Did not examine. Femoral & Inguinal -Note:no palpable LAD.     Assessment & Plan [name] M. Eliyanah Elgersma MD; 08/16/2016 11:26 AM)  OBESITY, MORBID, BMI 40.0-49.9 (E66.01) Impression: We discussed her workup. We discussed the results of the EKG, chest x-ray and upper GI. We discussed the typical hospital course and postoperative course. We rediscussed the typical postoperative complaints such as constipation and fatigue and dehydration. We discussed the typical return to work schedule. She was given her preoperative bowel prep instructions as well as her postoperative prescriptions. All of her questions were asked and answered. I encouraged her to contact the office should she have any additional questions  between now and surgery.  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 Sabrina Campanile,  MD, FACS General, Bariatric, & Minimally Invasive Surgery [facility], [place]

## 2016-08-22 NOTE — Patient Instructions (Addendum)
[name]  08/22/2016   Your procedure is scheduled on: 08-28-16  Report to [facility]  [facility] to Short Stay Center at [phone].   Call this number if you have problems the morning of surgery [phone]    Remember: ONLY 1 PERSON MAY GO WITH YOU TO SHORT STAY TO GET  READY MORNING OF YOUR SURGERY.  Do bowel prep per your MD's instructions. Do not eat food or drink liquids :After Midnight.     Take these medicines the morning of surgery with A SIP OF WATER: Dexilant and Vilazodone. You may also bring and use your nasal spray as needed. Bring  and use Inhaler if needed               You may not have any metal on your body including hair pins and              piercings  Do not wear jewelry, make-up, lotions, powders or perfumes, deodorant             Do not wear nail polish.  Do not shave  48 hours prior to surgery.     Do not bring valuables to the hospital. [facility] IS NOT             RESPONSIBLE   FOR VALUABLES.  Contacts, dentures or bridgework may not be worn into surgery.  Leave suitcase in the car. After surgery it may be brought to your room.             Special Instructions: Practice coughing and deep breathing exercises, leg exercises                   Please read over the following fact sheets you were given: _____________________________________________________________________             [facility] - Preparing for Surgery Before surgery, you can play an important role.  Because skin is not sterile, your skin needs to be as free of germs as possible.  You can reduce the number of germs on your skin by washing with CHG (chlorahexidine gluconate) soap before surgery.  CHG is an antiseptic cleaner which kills germs and bonds with the skin to continue killing germs even after washing. Please DO NOT use if you have an allergy to CHG or antibacterial soaps.  If your skin becomes reddened/irritated stop using the CHG  and inform your nurse when you arrive at Short Stay. Do not shave (including legs and underarms) for at least 48 hours prior to the first CHG shower.  You may shave your face/neck. Please follow these instructions carefully:  1.  Shower with CHG Soap the night before surgery and the  morning of Surgery.  2.  If you choose to wash your hair, wash your hair first as usual with your  normal  shampoo.  3.  After you shampoo, rinse your hair and body thoroughly to remove the  shampoo.                           4.  Use CHG as you would any other liquid soap.  You can apply chg directly  to the skin and wash                       Gently with a  scrungie or clean washcloth.  5.  Apply the CHG Soap to your body ONLY FROM THE NECK DOWN.   Do not use on face/ open                           Wound or open sores. Avoid contact with eyes, ears mouth and genitals (private parts).                       Wash face,  Genitals (private parts) with your normal soap.             6.  Wash thoroughly, paying special attention to the area where your surgery  will be performed.  7.  Thoroughly rinse your body with warm water from the neck down.  8.  DO NOT shower/wash with your normal soap after using and rinsing off  the CHG Soap.                9.  Pat yourself dry with a clean towel.            10.  Wear clean pajamas.            11.  Place clean sheets on your bed the night of your first shower and do not  sleep with pets. Day of Surgery : Do not apply any lotions/deodorants the morning of surgery.  Please wear clean clothes to the hospital/surgery center.  FAILURE TO FOLLOW THESE INSTRUCTIONS MAY RESULT IN THE CANCELLATION OF YOUR SURGERY PATIENT SIGNATURE_________________________________  NURSE SIGNATURE__________________________________  ________________________________________________________________________   [name]  An incentive spirometer is a tool that can help keep your lungs clear and  active. This tool measures how well you are filling your lungs with each breath. Taking long deep breaths may help reverse or decrease the chance of developing breathing (pulmonary) problems (especially infection) following:  A long period of time when you are unable to move or be active. BEFORE THE PROCEDURE   If the spirometer includes an indicator to show your best effort, your nurse or respiratory therapist will set it to a desired goal.  If possible, sit up straight or lean slightly forward. Try not to slouch.  Hold the incentive spirometer in an upright position. INSTRUCTIONS FOR USE  1. Sit on the edge of your bed if possible, or sit up as far as you can in bed or on a chair. 2. Hold the incentive spirometer in an upright position. 3. Breathe out normally. 4. Place the mouthpiece in your mouth and seal your lips tightly around it. 5. Breathe in slowly and as deeply as possible, raising the piston or the ball toward the top of the column. 6. Hold your breath for 3-5 seconds or for as long as possible. Allow the piston or ball to fall to the bottom of the column. 7. Remove the mouthpiece from your mouth and breathe out normally. 8. Rest for a few seconds and repeat Steps 1 through 7 at least 10 times every 1-2 hours when you are awake. Take your time and take a few normal breaths between deep breaths. 9. The spirometer may include an indicator to show your best effort. Use the indicator as a goal to work toward during each repetition. 10. After each set of 10 deep breaths, practice coughing to be sure your lungs are clear. If you have an incision (the cut made at the time of surgery), support your incision  when coughing by placing a pillow or rolled up towels firmly against it. Once you are able to get out of bed, walk around indoors and cough well. You may stop using the incentive spirometer when instructed by your caregiver.  RISKS AND COMPLICATIONS  Take your time so you do not get  dizzy or light-headed.  If you are in pain, you may need to take or ask for pain medication before doing incentive spirometry. It is harder to take a deep breath if you are having pain. AFTER USE  Rest and breathe slowly and easily.  It can be helpful to keep track of a log of your progress. Your caregiver can provide you with a simple table to help with this. If you are using the spirometer at home, follow these instructions: [place] IF:   You are having difficultly using the spirometer.  You have trouble using the spirometer as often as instructed.  Your pain medication is not giving enough relief while using the spirometer.  You develop fever of 100.5 F (38.1 C) or higher. SEEK IMMEDIATE MEDICAL CARE IF:   You cough up bloody sputum that had not been present before.  You develop fever of 102 F (38.9 C) or greater.  You develop worsening pain at or near the incision site. MAKE SURE YOU:   Understand these instructions.  Will watch your condition.  Will get help right away if you are not doing well or get worse. Document Released: 09/11/2006 Document Revised: 07/24/2011 Document Reviewed: 11/12/2006 [facility] Patient Information 2014 [place], [place].   ________________________________________________________________________

## 2016-08-22 NOTE — Progress Notes (Signed)
07-12-16 (EPIC) EKG, CXR

## 2016-08-24 ENCOUNTER — Encounter (HOSPITAL_COMMUNITY)
Admission: RE | Admit: 2016-08-24 | Discharge: 2016-08-24 | Disposition: A | Discharge status: Home / Self Care | Payer: BC Managed Care – PPO | Source: Ambulatory Visit | Attending: General Surgery | Admitting: General Surgery

## 2016-08-24 ENCOUNTER — Encounter (HOSPITAL_COMMUNITY): Payer: Self-pay

## 2016-08-24 DIAGNOSIS — Z01818 Encounter for other preprocedural examination: Secondary | ICD-10-CM | POA: Diagnosis not present

## 2016-08-24 HISTORY — DX: Chronic kidney disease, unspecified: N18.9

## 2016-08-24 LAB — CBC WITH DIFFERENTIAL/PLATELET
BASOS ABS: 0 10*3/uL (ref 0.0–0.1)
Basophils Relative: 1 %
EOS PCT: 2 %
Eosinophils Absolute: 0.1 10*3/uL (ref 0.0–0.7)
HCT: 36.8 % (ref 36.0–46.0)
HEMOGLOBIN: 12.5 g/dL (ref 12.0–15.0)
LYMPHS ABS: 1.9 10*3/uL (ref 0.7–4.0)
LYMPHS PCT: 24 %
MCH: 29 pg (ref 26.0–34.0)
MCHC: 34 g/dL (ref 30.0–36.0)
MCV: 85.4 fL (ref 78.0–100.0)
Monocytes Absolute: 0.5 10*3/uL (ref 0.1–1.0)
Monocytes Relative: 6 %
NEUTROS ABS: 5.1 10*3/uL (ref 1.7–7.7)
Neutrophils Relative %: 67 %
PLATELETS: 303 10*3/uL (ref 150–400)
RBC: 4.31 MIL/uL (ref 3.87–5.11)
RDW: 12.4 % (ref 11.5–15.5)
WBC: 7.6 10*3/uL (ref 4.0–10.5)

## 2016-08-24 LAB — COMPREHENSIVE METABOLIC PANEL
ALK PHOS: 56 U/L (ref 38–126)
ALT: 13 U/L — AB (ref 14–54)
AST: 21 U/L (ref 15–41)
Albumin: 4.2 g/dL (ref 3.5–5.0)
Anion gap: 6 (ref 5–15)
BUN: 24 mg/dL — AB (ref 6–20)
CALCIUM: 9.1 mg/dL (ref 8.9–10.3)
CHLORIDE: 107 mmol/L (ref 101–111)
CO2: 27 mmol/L (ref 22–32)
CREATININE: 0.84 mg/dL (ref 0.44–1.00)
GFR calc Af Amer: >60 mL/min (ref >60)
Glucose, Bld: 100 mg/dL — ABNORMAL HIGH (ref 65–99)
Potassium: 4.5 mmol/L (ref 3.5–5.1)
Sodium: 140 mmol/L (ref 135–145)
Total Bilirubin: 0.5 mg/dL (ref 0.3–1.2)
Total Protein: 7.4 g/dL (ref 6.5–8.1)

## 2016-08-24 NOTE — Progress Notes (Signed)
08-24-16 CMP results routed to Dr. Lequita Halt for review

## 2016-08-28 ENCOUNTER — Encounter (HOSPITAL_COMMUNITY)
Admission: RE | Disposition: A | Discharge status: Home / Self Care | Payer: Self-pay | Source: Ambulatory Visit | Attending: General Surgery

## 2016-08-28 ENCOUNTER — Inpatient Hospital Stay (HOSPITAL_COMMUNITY)
Admission: RE | Admit: 2016-08-28 | Discharge: 2016-08-29 | DRG: 621 | Disposition: A | Discharge status: Home / Self Care | Payer: BC Managed Care – PPO | Source: Ambulatory Visit | Attending: General Surgery | Admitting: General Surgery

## 2016-08-28 ENCOUNTER — Encounter (HOSPITAL_COMMUNITY): Payer: Self-pay | Admitting: *Deleted

## 2016-08-28 ENCOUNTER — Inpatient Hospital Stay (HOSPITAL_COMMUNITY): Payer: BC Managed Care – PPO | Admitting: Anesthesiology

## 2016-08-28 DIAGNOSIS — M5136 Other intervertebral disc degeneration, lumbar region: Secondary | ICD-10-CM | POA: Diagnosis present

## 2016-08-28 DIAGNOSIS — Z882 Allergy status to sulfonamides status: Secondary | ICD-10-CM

## 2016-08-28 DIAGNOSIS — R141 Gas pain: Secondary | ICD-10-CM | POA: Diagnosis not present

## 2016-08-28 DIAGNOSIS — M25562 Pain in left knee: Secondary | ICD-10-CM | POA: Diagnosis present

## 2016-08-28 DIAGNOSIS — Z9884 Bariatric surgery status: Secondary | ICD-10-CM

## 2016-08-28 DIAGNOSIS — Z9049 Acquired absence of other specified parts of digestive tract: Secondary | ICD-10-CM | POA: Diagnosis not present

## 2016-08-28 DIAGNOSIS — Z888 Allergy status to other drugs, medicaments and biological substances status: Secondary | ICD-10-CM

## 2016-08-28 DIAGNOSIS — Z79899 Other long term (current) drug therapy: Secondary | ICD-10-CM

## 2016-08-28 DIAGNOSIS — Z881 Allergy status to other antibiotic agents status: Secondary | ICD-10-CM | POA: Diagnosis not present

## 2016-08-28 DIAGNOSIS — M5432 Sciatica, left side: Secondary | ICD-10-CM | POA: Diagnosis present

## 2016-08-28 DIAGNOSIS — M519 Unspecified thoracic, thoracolumbar and lumbosacral intervertebral disc disorder: Secondary | ICD-10-CM | POA: Diagnosis present

## 2016-08-28 DIAGNOSIS — R110 Nausea: Secondary | ICD-10-CM | POA: Diagnosis not present

## 2016-08-28 DIAGNOSIS — Z8249 Family history of ischemic heart disease and other diseases of the circulatory system: Secondary | ICD-10-CM | POA: Diagnosis not present

## 2016-08-28 DIAGNOSIS — Z6841 Body Mass Index (BMI) 40.0 and over, adult: Secondary | ICD-10-CM | POA: Diagnosis not present

## 2016-08-28 DIAGNOSIS — K319 Disease of stomach and duodenum, unspecified: Secondary | ICD-10-CM | POA: Diagnosis present

## 2016-08-28 DIAGNOSIS — Z803 Family history of malignant neoplasm of breast: Secondary | ICD-10-CM | POA: Diagnosis not present

## 2016-08-28 DIAGNOSIS — F329 Major depressive disorder, single episode, unspecified: Secondary | ICD-10-CM | POA: Diagnosis present

## 2016-08-28 DIAGNOSIS — E538 Deficiency of other specified B group vitamins: Secondary | ICD-10-CM | POA: Diagnosis present

## 2016-08-28 DIAGNOSIS — K219 Gastro-esophageal reflux disease without esophagitis: Secondary | ICD-10-CM | POA: Diagnosis present

## 2016-08-28 DIAGNOSIS — M5431 Sciatica, right side: Secondary | ICD-10-CM | POA: Diagnosis present

## 2016-08-28 DIAGNOSIS — Z8261 Family history of arthritis: Secondary | ICD-10-CM

## 2016-08-28 HISTORY — PX: GASTRIC ROUX-EN-Y: SHX5262

## 2016-08-28 LAB — HEMOGLOBIN AND HEMATOCRIT, BLOOD
HEMATOCRIT: 37.9 % (ref 36.0–46.0)
Hemoglobin: 13.1 g/dL (ref 12.0–15.0)

## 2016-08-28 LAB — HCG, SERUM, QUALITATIVE: Preg, Serum: NEGATIVE

## 2016-08-28 SURGERY — LAPAROSCOPIC ROUX-EN-Y GASTRIC BYPASS WITH UPPER ENDOSCOPY
Anesthesia: General | Site: Abdomen

## 2016-08-28 MED ORDER — LACTATED RINGERS IV SOLN: INTRAVENOUS | Status: DC

## 2016-08-28 MED ORDER — LIDOCAINE 2% (20 MG/ML) 5 ML SYRINGE
INTRAMUSCULAR | Status: AC
  Filled 2016-08-28: qty 5

## 2016-08-28 MED ORDER — ACETAMINOPHEN 325 MG PO TABS: 650.0000 mg | ORAL_TABLET | ORAL | Status: DC | PRN

## 2016-08-28 MED ORDER — MIDAZOLAM HCL 2 MG/2ML IJ SOLN
INTRAMUSCULAR | Status: AC
  Filled 2016-08-28: qty 2

## 2016-08-28 MED ORDER — HYDRALAZINE HCL 20 MG/ML IJ SOLN
INTRAMUSCULAR | Status: AC
  Administered 2016-08-28: 5 mg via INTRAVENOUS
  Filled 2016-08-28: qty 1

## 2016-08-28 MED ORDER — DEXAMETHASONE SODIUM PHOSPHATE 10 MG/ML IJ SOLN
4.0000 mg | INTRAMUSCULAR | Status: AC
  Administered 2016-08-28: 4 mg via INTRAVENOUS

## 2016-08-28 MED ORDER — ONDANSETRON HCL 4 MG/2ML IJ SOLN: 4.0000 mg | Freq: Once | INTRAMUSCULAR | Status: DC | PRN

## 2016-08-28 MED ORDER — PROMETHAZINE HCL 25 MG/ML IJ SOLN: 6.2500 mg | INTRAMUSCULAR | Status: DC | PRN

## 2016-08-28 MED ORDER — OXYCODONE HCL 5 MG/5ML PO SOLN
5.0000 mg | Freq: Once | ORAL | Status: DC | PRN
  Filled 2016-08-28: qty 5

## 2016-08-28 MED ORDER — ENOXAPARIN SODIUM 30 MG/0.3ML ~~LOC~~ SOLN
30.0000 mg | Freq: Two times a day (BID) | SUBCUTANEOUS | Status: DC
  Administered 2016-08-28 – 2016-08-29 (×2): 30 mg via SUBCUTANEOUS
  Filled 2016-08-28 (×2): qty 0.3

## 2016-08-28 MED ORDER — CHLORHEXIDINE GLUCONATE 4 % EX LIQD: 60.0000 mL | Freq: Once | CUTANEOUS | Status: DC

## 2016-08-28 MED ORDER — PREMIER PROTEIN SHAKE: 2.0000 [oz_av] | ORAL | Status: DC

## 2016-08-28 MED ORDER — LACTATED RINGERS IV SOLN
INTRAVENOUS | Status: DC | PRN
  Administered 2016-08-28 (×3): via INTRAVENOUS

## 2016-08-28 MED ORDER — OXYCODONE HCL 5 MG PO TABS: 5.0000 mg | ORAL_TABLET | Freq: Once | ORAL | Status: DC | PRN

## 2016-08-28 MED ORDER — LIDOCAINE 2% (20 MG/ML) 5 ML SYRINGE
INTRAMUSCULAR | Status: AC
  Filled 2016-08-28: qty 10

## 2016-08-28 MED ORDER — FENTANYL CITRATE (PF) 100 MCG/2ML IJ SOLN
INTRAMUSCULAR | Status: AC
  Filled 2016-08-28: qty 2

## 2016-08-28 MED ORDER — ALBUTEROL SULFATE (2.5 MG/3ML) 0.083% IN NEBU: 3.0000 mL | INHALATION_SOLUTION | Freq: Four times a day (QID) | RESPIRATORY_TRACT | Status: DC | PRN

## 2016-08-28 MED ORDER — SCOPOLAMINE 1 MG/3DAYS TD PT72
1.0000 | MEDICATED_PATCH | TRANSDERMAL | Status: DC
  Administered 2016-08-28: 1.5 mg via TRANSDERMAL
  Filled 2016-08-28: qty 1

## 2016-08-28 MED ORDER — DIPHENHYDRAMINE HCL 50 MG/ML IJ SOLN: 12.5000 mg | Freq: Three times a day (TID) | INTRAMUSCULAR | Status: DC | PRN

## 2016-08-28 MED ORDER — ROCURONIUM BROMIDE 50 MG/5ML IV SOSY
PREFILLED_SYRINGE | INTRAVENOUS | Status: AC
  Filled 2016-08-28: qty 5

## 2016-08-28 MED ORDER — HYDRALAZINE HCL 20 MG/ML IJ SOLN
5.0000 mg | INTRAMUSCULAR | Status: DC | PRN
  Administered 2016-08-28 (×3): 5 mg via INTRAVENOUS

## 2016-08-28 MED ORDER — FENTANYL CITRATE (PF) 100 MCG/2ML IJ SOLN
INTRAMUSCULAR | Status: DC | PRN
  Administered 2016-08-28 (×4): 50 ug via INTRAVENOUS
  Administered 2016-08-28: 100 ug via INTRAVENOUS
  Administered 2016-08-28: 50 ug via INTRAVENOUS

## 2016-08-28 MED ORDER — FENTANYL CITRATE (PF) 100 MCG/2ML IJ SOLN
25.0000 ug | INTRAMUSCULAR | Status: DC | PRN
Start: 2016-08-28 — End: 2016-08-28
  Administered 2016-08-28 (×3): 50 ug via INTRAVENOUS

## 2016-08-28 MED ORDER — SUGAMMADEX SODIUM 200 MG/2ML IV SOLN
INTRAVENOUS | Status: DC | PRN
  Administered 2016-08-28: 200 mg via INTRAVENOUS

## 2016-08-28 MED ORDER — ONDANSETRON HCL 4 MG/2ML IJ SOLN
INTRAMUSCULAR | Status: DC | PRN
  Administered 2016-08-28 (×2): 4 mg via INTRAVENOUS

## 2016-08-28 MED ORDER — MORPHINE SULFATE (PF) 10 MG/ML IV SOLN
1.0000 mg | INTRAVENOUS | Status: DC | PRN
  Administered 2016-08-28 (×2): 2 mg via INTRAVENOUS
  Filled 2016-08-28 (×2): qty 1

## 2016-08-28 MED ORDER — PANTOPRAZOLE SODIUM 40 MG IV SOLR
40.0000 mg | Freq: Every day | INTRAVENOUS | Status: DC
  Administered 2016-08-28: 40 mg via INTRAVENOUS
  Filled 2016-08-28: qty 40

## 2016-08-28 MED ORDER — LACTATED RINGERS IR SOLN
Status: DC | PRN
  Administered 2016-08-28: 1000 mL

## 2016-08-28 MED ORDER — LEVOFLOXACIN IN D5W 750 MG/150ML IV SOLN
750.0000 mg | INTRAVENOUS | Status: AC
  Administered 2016-08-28: 750 mg via INTRAVENOUS

## 2016-08-28 MED ORDER — APREPITANT 40 MG PO CAPS
40.0000 mg | ORAL_CAPSULE | ORAL | Status: AC
  Administered 2016-08-28: 40 mg via ORAL
  Filled 2016-08-28: qty 1

## 2016-08-28 MED ORDER — MIDAZOLAM HCL 5 MG/5ML IJ SOLN
INTRAMUSCULAR | Status: DC | PRN
  Administered 2016-08-28: 2 mg via INTRAVENOUS

## 2016-08-28 MED ORDER — KCL IN DEXTROSE-NACL 20-5-0.45 MEQ/L-%-% IV SOLN
INTRAVENOUS | Status: DC
  Administered 2016-08-28: 18:00:00 via INTRAVENOUS
  Administered 2016-08-29: 125 mL/h via INTRAVENOUS
  Administered 2016-08-29: 01:00:00 via INTRAVENOUS
  Filled 2016-08-28 (×4): qty 1000

## 2016-08-28 MED ORDER — GABAPENTIN 300 MG PO CAPS
300.0000 mg | ORAL_CAPSULE | ORAL | Status: AC
  Administered 2016-08-28: 300 mg via ORAL
  Filled 2016-08-28: qty 1

## 2016-08-28 MED ORDER — ACETAMINOPHEN 160 MG/5ML PO SOLN: 325.0000 mg | ORAL | Status: DC | PRN

## 2016-08-28 MED ORDER — FENTANYL CITRATE (PF) 250 MCG/5ML IJ SOLN
INTRAMUSCULAR | Status: AC
  Filled 2016-08-28: qty 5

## 2016-08-28 MED ORDER — PROMETHAZINE HCL 25 MG/ML IJ SOLN: 12.5000 mg | Freq: Four times a day (QID) | INTRAMUSCULAR | Status: DC | PRN

## 2016-08-28 MED ORDER — ONDANSETRON HCL 4 MG/2ML IJ SOLN
4.0000 mg | Freq: Four times a day (QID) | INTRAMUSCULAR | Status: DC | PRN
Start: 2016-08-28 — End: 2016-08-29

## 2016-08-28 MED ORDER — PROPOFOL 10 MG/ML IV BOLUS
INTRAVENOUS | Status: AC
  Filled 2016-08-28: qty 20

## 2016-08-28 MED ORDER — BUPIVACAINE LIPOSOME 1.3 % IJ SUSP
20.0000 mL | Freq: Once | INTRAMUSCULAR | Status: AC
  Administered 2016-08-28: 20 mL
  Filled 2016-08-28: qty 20

## 2016-08-28 MED ORDER — OXYCODONE HCL 5 MG/5ML PO SOLN
5.0000 mg | ORAL | Status: DC | PRN
  Administered 2016-08-28 – 2016-08-29 (×4): 5 mg via ORAL
  Filled 2016-08-28 (×4): qty 5

## 2016-08-28 MED ORDER — FENTANYL CITRATE (PF) 100 MCG/2ML IJ SOLN: 25.0000 ug | INTRAMUSCULAR | Status: DC | PRN

## 2016-08-28 MED ORDER — PROPOFOL 10 MG/ML IV BOLUS
INTRAVENOUS | Status: DC | PRN
  Administered 2016-08-28: 200 mg via INTRAVENOUS

## 2016-08-28 MED ORDER — ROCURONIUM BROMIDE 10 MG/ML (PF) SYRINGE
PREFILLED_SYRINGE | INTRAVENOUS | Status: DC | PRN
  Administered 2016-08-28 (×2): 10 mg via INTRAVENOUS
  Administered 2016-08-28: 60 mg via INTRAVENOUS
  Administered 2016-08-28: 20 mg via INTRAVENOUS

## 2016-08-28 MED ORDER — EVICEL 5 ML EX KIT
PACK | Freq: Once | CUTANEOUS | Status: AC
  Administered 2016-08-28: 5 mL
  Filled 2016-08-28: qty 1

## 2016-08-28 MED ORDER — PROMETHAZINE HCL 25 MG/ML IJ SOLN
INTRAMUSCULAR | Status: AC
  Filled 2016-08-28: qty 1

## 2016-08-28 MED ORDER — SODIUM CHLORIDE 0.9 % IJ SOLN
INTRAMUSCULAR | Status: DC | PRN
  Administered 2016-08-28: 50 mL

## 2016-08-28 MED ORDER — SODIUM CHLORIDE 0.9 % IJ SOLN
INTRAMUSCULAR | Status: AC
  Filled 2016-08-28: qty 50

## 2016-08-28 MED ORDER — SUCCINYLCHOLINE CHLORIDE 200 MG/10ML IV SOSY
PREFILLED_SYRINGE | INTRAVENOUS | Status: AC
  Filled 2016-08-28: qty 10

## 2016-08-28 MED ORDER — DEXAMETHASONE SODIUM PHOSPHATE 10 MG/ML IJ SOLN
INTRAMUSCULAR | Status: AC
  Filled 2016-08-28: qty 1

## 2016-08-28 MED ORDER — HEPARIN SODIUM (PORCINE) 5000 UNIT/ML IJ SOLN
5000.0000 [IU] | INTRAMUSCULAR | Status: AC
  Administered 2016-08-28: 5000 [IU] via SUBCUTANEOUS
  Filled 2016-08-28: qty 1

## 2016-08-28 MED ORDER — ACETAMINOPHEN 500 MG PO TABS
1000.0000 mg | ORAL_TABLET | ORAL | Status: AC
  Administered 2016-08-28: 1000 mg via ORAL
  Filled 2016-08-28: qty 2

## 2016-08-28 MED ORDER — LIDOCAINE 2% (20 MG/ML) 5 ML SYRINGE
INTRAMUSCULAR | Status: DC | PRN
  Administered 2016-08-28: 1.5 mg/kg/h via INTRAVENOUS

## 2016-08-28 MED ORDER — FENTANYL CITRATE (PF) 100 MCG/2ML IJ SOLN
INTRAMUSCULAR | Status: AC
  Administered 2016-08-28: 50 ug via INTRAVENOUS
  Filled 2016-08-28: qty 2

## 2016-08-28 MED ORDER — LEVOFLOXACIN IN D5W 750 MG/150ML IV SOLN
INTRAVENOUS | Status: AC
  Filled 2016-08-28: qty 150

## 2016-08-28 MED ORDER — SIMETHICONE 40 MG/0.6ML PO SUSP
40.0000 mg | Freq: Four times a day (QID) | ORAL | Status: DC | PRN
  Administered 2016-08-29: 40 mg via ORAL
  Filled 2016-08-28 (×2): qty 0.6

## 2016-08-28 SURGICAL SUPPLY — 77 items
APPLICATOR COTTON TIP 6IN STRL (MISCELLANEOUS) ×6 IMPLANT
APPLIER CLIP ROT 13.4 12 LRG (CLIP)
BAG RETRIEVAL 10 (BASKET) ×1
BAG RETRIEVAL 10MM (BASKET) ×1
BANDAGE ADH SHEER 1  50/CT (GAUZE/BANDAGES/DRESSINGS) ×18 IMPLANT
BENZOIN TINCTURE PRP APPL 2/3 (GAUZE/BANDAGES/DRESSINGS) ×3 IMPLANT
BLADE SURG SZ11 CARB STEEL (BLADE) ×3 IMPLANT
CABLE HIGH FREQUENCY MONO STRZ (ELECTRODE) IMPLANT
CHLORAPREP W/TINT 26ML (MISCELLANEOUS) ×6 IMPLANT
CLIP APPLIE ROT 13.4 12 LRG (CLIP) IMPLANT
CLIP SUT LAPRA TY ABSORB (SUTURE) ×6 IMPLANT
CLOSURE WOUND 1/2 X4 (GAUZE/BANDAGES/DRESSINGS) ×1
CUTTER FLEX LINEAR 45M (STAPLE) IMPLANT
DERMABOND ADVANCED (GAUZE/BANDAGES/DRESSINGS)
DERMABOND ADVANCED .7 DNX12 (GAUZE/BANDAGES/DRESSINGS) IMPLANT
DEVICE SUT QUICK LOAD TK 5 (STAPLE) IMPLANT
DEVICE SUT TI-KNOT TK 5X26 (MISCELLANEOUS) IMPLANT
DEVICE SUTURE ENDOST 10MM (ENDOMECHANICALS) ×9 IMPLANT
DEVICE TI KNOT TK5 (MISCELLANEOUS)
DRAIN PENROSE 18X1/4 LTX STRL (WOUND CARE) ×3 IMPLANT
ELECT REM PT RETURN 15FT ADLT (MISCELLANEOUS) ×3 IMPLANT
GAUZE SPONGE 4X4 12PLY STRL (GAUZE/BANDAGES/DRESSINGS) IMPLANT
GAUZE SPONGE 4X4 16PLY XRAY LF (GAUZE/BANDAGES/DRESSINGS) ×3 IMPLANT
GLOVE BIO SURGEON STRL SZ7.5 (GLOVE) IMPLANT
GLOVE INDICATOR 8.0 STRL GRN (GLOVE) ×3 IMPLANT
GOWN STRL REUS W/TWL XL LVL3 (GOWN DISPOSABLE) ×12 IMPLANT
HOVERMATT SINGLE USE (MISCELLANEOUS) ×3 IMPLANT
IRRIG SUCT STRYKERFLOW 2 WTIP (MISCELLANEOUS) ×3
IRRIGATION SUCT STRKRFLW 2 WTP (MISCELLANEOUS) ×1 IMPLANT
KIT BASIN OR (CUSTOM PROCEDURE TRAY) ×3 IMPLANT
KIT GASTRIC LAVAGE 34FR ADT (SET/KITS/TRAYS/PACK) ×3 IMPLANT
LUBRICANT JELLY K Y 4OZ (MISCELLANEOUS) ×3 IMPLANT
MARKER SKIN DUAL TIP RULER LAB (MISCELLANEOUS) ×3 IMPLANT
NEEDLE SPNL 22GX3.5 QUINCKE BK (NEEDLE) ×3 IMPLANT
PACK CARDIOVASCULAR III (CUSTOM PROCEDURE TRAY) ×3 IMPLANT
QUICK LOAD TK 5 (STAPLE)
RELOAD 45 VASCULAR/THIN (ENDOMECHANICALS) IMPLANT
RELOAD ENDO STITCH 2.0 (ENDOMECHANICALS) ×18
RELOAD STAPLE TA45 3.5 REG BLU (ENDOMECHANICALS) IMPLANT
RELOAD STAPLER BLUE 60MM (STAPLE) ×4 IMPLANT
RELOAD STAPLER GOLD 60MM (STAPLE) ×1 IMPLANT
RELOAD STAPLER WHITE 60MM (STAPLE) ×3 IMPLANT
SCISSORS LAP 5X45 EPIX DISP (ENDOMECHANICALS) ×3 IMPLANT
SHEARS HARMONIC ACE PLUS 45CM (MISCELLANEOUS) ×3 IMPLANT
SLEEVE ADV FIXATION 12X100MM (TROCAR) ×6 IMPLANT
SLEEVE XCEL OPT CAN 5 100 (ENDOMECHANICALS) IMPLANT
SOLUTION ANTI FOG 6CC (MISCELLANEOUS) ×3 IMPLANT
STAPLER ECHELON BIOABSB 60 FLE (MISCELLANEOUS) IMPLANT
STAPLER ECHELON LONG 60 440 (INSTRUMENTS) ×3 IMPLANT
STAPLER RELOAD BLUE 60MM (STAPLE) ×12
STAPLER RELOAD GOLD 60MM (STAPLE) ×3
STAPLER RELOAD WHITE 60MM (STAPLE) ×9
STRIP CLOSURE SKIN 1/2X4 (GAUZE/BANDAGES/DRESSINGS) ×2 IMPLANT
SUT MNCRL AB 4-0 PS2 18 (SUTURE) ×3 IMPLANT
SUT RELOAD ENDO STITCH 2 48X1 (ENDOMECHANICALS) ×4
SUT RELOAD ENDO STITCH 2.0 (ENDOMECHANICALS) ×5
SUT SURGIDAC NAB ES-9 0 48 120 (SUTURE) IMPLANT
SUT VIC AB 2-0 SH 27 (SUTURE) ×2
SUT VIC AB 2-0 SH 27X BRD (SUTURE) ×1 IMPLANT
SUTURE RELOAD END STTCH 2 48X1 (ENDOMECHANICALS) ×4 IMPLANT
SUTURE RELOAD ENDO STITCH 2.0 (ENDOMECHANICALS) ×5 IMPLANT
SYR 10ML ECCENTRIC (SYRINGE) ×3 IMPLANT
SYR 20CC LL (SYRINGE) ×6 IMPLANT
SYS BAG RETRIEVAL 10MM (BASKET) ×1
SYSTEM BAG RETRIEVAL 10MM (BASKET) ×1 IMPLANT
TIP RIGID 35CM EVICEL (HEMOSTASIS) ×3 IMPLANT
TOWEL OR 17X26 10 PK STRL BLUE (TOWEL DISPOSABLE) ×3 IMPLANT
TOWEL OR NON WOVEN STRL DISP B (DISPOSABLE) ×3 IMPLANT
TRAY FOLEY W/METER SILVER 16FR (SET/KITS/TRAYS/PACK) ×3 IMPLANT
TROCAR ADV FIXATION 12X100MM (TROCAR) ×3 IMPLANT
TROCAR ADV FIXATION 5X100MM (TROCAR) ×3 IMPLANT
TROCAR BLADELESS OPT 5 100 (ENDOMECHANICALS) ×3 IMPLANT
TROCAR XCEL 12X100 BLDLESS (ENDOMECHANICALS) ×3 IMPLANT
TUBING CONNECTING 10 (TUBING) IMPLANT
TUBING CONNECTING 10' (TUBING)
TUBING ENDO SMARTCAP PENTAX (MISCELLANEOUS) ×3 IMPLANT
TUBING INSUF HEATED (TUBING) ×3 IMPLANT

## 2016-08-28 NOTE — Interval H&P Note (Signed)
History and Physical Interval Note:  08/28/2016 7:13 AM  [name]  has presented today for surgery, with the diagnosis of Morbid Obesity, B12 Deficiency, GERD,   The various methods of treatment have been discussed with the patient and family. After consideration of risks, benefits and other options for treatment, the patient has consented to  Procedure(s): LAPAROSCOPIC ROUX-EN-Y GASTRIC BYPASS WITH UPPER ENDOSCOPY (N/A) as a surgical intervention .  The patient's history has been reviewed, patient examined, no change in status, stable for surgery.  I have reviewed the patient's chart and labs.  Questions were answered to the patient's satisfaction.    Mary Sella. [name], MD, FACS General, Bariatric, & Minimally Invasive Surgery [facility], [place]    [facility]

## 2016-08-28 NOTE — Anesthesia Postprocedure Evaluation (Addendum)
Anesthesia Post Note  Patient: [name]  Procedure(s) Performed: Procedure(s) (LRB): LAPAROSCOPIC ROUX-EN-Y GASTRIC BYPASS WITH UPPER ENDOSCOPY (N/A)  Patient location during evaluation: PACU Anesthesia Type: General Level of consciousness: awake, awake and alert and oriented Pain management: pain level controlled Vital Signs Assessment: post-procedure vital signs reviewed and stable Respiratory status: spontaneous breathing, respiratory function stable and nonlabored ventilation Cardiovascular status: blood pressure returned to baseline Postop Assessment: no headache Anesthetic complications: no       Last Vitals:  Vitals:   08/28/16 1500 08/28/16 1600  BP: 132/72 (!) 146/84  Pulse: 68 (!) 54  Resp: 17 16  Temp: 37 C 36.7 C    Last Pain:  Vitals:   08/28/16 1600  TempSrc: Oral  PainSc:                  Ryszard Socarras COKER

## 2016-08-28 NOTE — Discharge Instructions (Signed)

## 2016-08-28 NOTE — Op Note (Signed)
[name] [id number] Dec 14, 1975. 08/28/2016  Preoperative diagnosis:    Severe obesity (BMI >= 41)    GERD   Knee pain, left   Lumbar disc disease  Postoperative  diagnosis:  1. same  Surgical procedure: Laparoscopic Roux-en-Y gastric bypass (ante-colic, ante-gastric); upper endoscopy  Surgeon: Atilano Ina, M.D. FACS  Asst.: Glenna Fellows MD FACS, Dylan Plocki PA-S  Anesthesia: General plus exparel  Complications: None   EBL: Minimal   Drains: None   Disposition: PACU in good condition   Indications for procedure: 41yo female with morbid obesity who has been unsuccessful at sustained weight loss. The patient's comorbidities are listed above. We discussed the risk and benefits of surgery including but not limited to anesthesia risk, bleeding, infection, blood clot formation, anastomotic leak, anastomotic stricture, ulcer formation, death, respiratory complications, intestinal blockage, internal hernia, gallstone formation, vitamin and nutritional deficiencies, injury to surrounding structures, failure to lose weight and mood changes.   Description of procedure: Patient is brought to the operating room and general anesthesia induced. The patient had received preoperative broad-spectrum IV antibiotics and subcutaneous heparin. The abdomen was widely sterilely prepped with Chloraprep and draped. Patient timeout was performed and correct patient and procedure confirmed. Access was obtained with a 12 mm Optiview trocar in the left upper quadrant and pneumoperitoneum established without difficulty. Under direct vision 12 mm trocars were placed laterally in the right upper quadrant, right upper quadrant midclavicular line, and to the left and above the umbilicus for the camera port. A 5 mm trocar was placed laterally in the left upper quadrant. Exparel was infiltrated in bilateral upper abdominal walls as a TAPP block. * The omentum was brought into the upper abdomen after taking  down some omental adhesions to the anterior abdominal wall and the transverse mesocolon elevated and the ligament of Treitz clearly identified. A 40 cm biliopancreatic limb was then carefully measured from the ligament of Treitz. The small intestine was divided at this point with a single firing of the white load linear stapler. A Penrose drain was sutured to the end of the Roux-en-Y limb for later identification. A 100 cm Roux-en-Y limb was then carefully measured. About 2cm of the BP limb just next to the staple looked a little dusky so this stapled off and discarded.  At this point a side-to-side anastomosis was created between the Roux limb and the end of the biliopancreatic limb. This was accomplished with a single firing of the 60 mm white load linear stapler. The common enterotomy was closed with a running 2-0 Vicryl begun at either end of the enterotomy and tied centrally. Eviceal tissue sealant was placed over the anastomosis. The mesenteric defect was then closed with running 2-0 silk. The omentum was then divided with the harmonic scalpel up towards the transverse colon to allow mobility of the Roux limb toward the gastric pouch. The patient was then placed in steep reversed Trendelenburg. Through a 5 mm subxiphoid site the [facility] retractor was placed and the left lobe of the liver elevated with excellent exposure of the upper stomach and hiatus. The angle of Hiss was then mobilized with the harmonic scalpel. A 4 cm gastric pouch was then carefully measured along the lesser curve of the stomach. Dissection was carried along the lesser curve at this point with the Harmonic scalpel working carefully back toward the lesser sac at right angles to the lesser curve. The free lesser sac was then entered. After being sure all tubes were removed from the stomach  an initial firing of the gold load 60 mm linear stapler was fired at right angles across the lesser curve for about 4 cm. The gastric pouch was further  mobilized posteriorly and then the pouch was completed with 3 further firings of the 60 mm blue load linear stapler up through the previously dissected angle of His. It was ensured that the pouch was completely mobilized away from the gastric remnant. This created a nice tubular 4-5 cm gastric pouch.  The Roux limb was then brought up in an antecolic fashion with the candycane facing to the patient's left without undue tension. The candy cane portion of the roux limb was very short. The gastrojejunostomy was created with an initial posterior row of 2-0 Vicryl between the Roux limb and the staple line of the gastric pouch. Enterotomies were then made in the gastric pouch and the Roux limb with the harmonic scalpel and an approximately 2-2-1/2 cm anastomosis was created with a single firing of the 60mm blue load linear stapler. The staple line was inspected and was intact without bleeding. The common enterotomy was then closed with running 2-0 Vicryl begun at either end and tied centrally. The Ewall tube was then easily passed through the anastomosis and an outer anterior layer of running 2-0 Vicryl was placed. The Ewald tube was removed. With the outlet of the gastrojejunostomy clamped and under saline irrigation the assistant performed upper endoscopy and with the gastric pouch tensely distended with air-there was no evidence of leak on this test. The pouch was desufflated. The Vonita Moss defect was closed with running 2-0 silk. The abdomen was inspected for any evidence of bleeding or bowel injury and everything looked fine. The Nathanson retractor was removed under direct vision after coating the anastomosis with Eviceal tissue sealant. All CO2 was evacuated and trochars removed. Skin incisions were closed with 4-0 monocryl in a subcuticular fashion followed by benzoin, steri-strips, and bandages. Sponge needle and instrument counts were correct. The patient was taken to the PACU in good condition.    Mary Sella.  [name], MD, FACS General, Bariatric, & Minimally Invasive Surgery [facility], [place]

## 2016-08-28 NOTE — Anesthesia Preprocedure Evaluation (Addendum)
Anesthesia Evaluation  Patient identified by MRN, date of birth, ID band Patient awake    Reviewed: Allergy & Precautions, NPO status , Patient's Chart, lab work & pertinent test results  Airway Mallampati: II  TM Distance: >3 FB     Dental  (+) Teeth Intact, Dental Advisory Given   Pulmonary    breath sounds clear to auscultation       Cardiovascular  Rhythm:Regular     Neuro/Psych    GI/Hepatic   Endo/Other    Renal/GU      Musculoskeletal   Abdominal   Peds  Hematology   Anesthesia Other Findings   Reproductive/Obstetrics                            Anesthesia Physical Anesthesia Plan  ASA: III  Anesthesia Plan: General   Post-op Pain Management:    Induction:   Airway Management Planned: Oral ETT  Additional Equipment:   Intra-op Plan:   Post-operative Plan: Extubation in OR  Informed Consent: I have reviewed the patients History and Physical, chart, labs and discussed the procedure including the risks, benefits and alternatives for the proposed anesthesia with the patient or authorized representative who has indicated his/her understanding and acceptance.   Dental advisory given  Plan Discussed with: CRNA and Anesthesiologist  Anesthesia Plan Comments:         Anesthesia Quick Evaluation

## 2016-08-28 NOTE — Transfer of Care (Signed)
Immediate Anesthesia Transfer of Care Note  Patient: [name]  Procedure(s) Performed: Procedure(s): LAPAROSCOPIC ROUX-EN-Y GASTRIC BYPASS WITH UPPER ENDOSCOPY (N/A)  Patient Location: PACU  Anesthesia Type:General  Level of Consciousness: awake and alert   Airway & Oxygen Therapy: Patient Spontanous Breathing and Patient connected to face mask oxygen  Post-op Assessment: Report given to RN and Post -op Vital signs reviewed and stable  Post vital signs: Reviewed and stable  Last Vitals:  Vitals:   08/28/16 0540  BP: 138/86  Pulse: 81  Resp: 18  Temp: 36.7 C    Last Pain:  Vitals:   08/28/16 0540  TempSrc: Oral         Complications: No apparent anesthesia complications

## 2016-08-28 NOTE — Op Note (Signed)
Upper GI endoscopy is performed at the completion of laparoscopic Roux-en-Y gastric bypass by Dr. [name]. The Olympus video endoscope was inserted into the upper esophagus and then passed under direct vision to the EG junction. The small gastric pouch was insufflated with air while the gastric outlet was clamped under irrigation by the operating surgeon. There was no evidence of leak. The anastomosis was visualized and was patent. Suture and staple lines were intact and without bleeding. The pouch was tubular and measured 4-5 cm in length.  A tiny, 2-3 mm polyp was noted on the anterior wall of the pouch,  At the completion of the procedure the pouch was desufflated and the scope withdrawn.  Mariella Saa MD, FACS  08/28/2016, 10:03 AM

## 2016-08-28 NOTE — Anesthesia Procedure Notes (Signed)
Procedure Name: Intubation Date/Time: 08/28/2016 7:40 AM Performed by: Elyn Peers Pre-anesthesia Checklist: Patient identified, Emergency Drugs available, Suction available, Patient being monitored and Timeout performed Patient Re-evaluated:Patient Re-evaluated prior to inductionOxygen Delivery Method: Circle system utilized Preoxygenation: Pre-oxygenation with 100% oxygen Intubation Type: IV induction Ventilation: Mask ventilation without difficulty Laryngoscope Size: Miller and 3 Grade View: Grade I Tube type: Oral Tube size: 7.5 mm Number of attempts: 1 Airway Equipment and Method: Stylet Placement Confirmation: ETT inserted through vocal cords under direct vision,  positive ETCO2 and breath sounds checked- equal and bilateral Secured at: 22 cm Tube secured with: Tape Dental Injury: Teeth and Oropharynx as per pre-operative assessment

## 2016-08-28 NOTE — H&P (View-Only) (Signed)
[name] 08/16/2016 11:02 AM Location: [facility] Patient #: [id number] DOB: 10-13-1975 Married / Language: [place] / Race: White Female  History of Present Illness Sabrina Areola M. Manali Mcelmurry Flowers; 08/16/2016 11:28 AM) The patient is a 41 year old female who presents for a bariatric surgery evaluation. She comes in today for preoperative appointment. She has been approved for laparoscopic Roux-en-Y gastric bypass. She denies any medical changes since I initially saw her. She denies any trips the emergency room or hospital. She has started her preoperative meal plan. She denies any chest pain or chest pressure. She denies any shorts breath. She denies any tobacco use. Her preoperative workup was unremarkable. There is no surprises with her reoperative blood work. There were no vitamin deficiencies. Chest x-ray and upper GI were within normal limits.   28/18 She is referred by Dr Loreen Freud for evaluation of weight loss surgery. She is interested in pursuing weight loss surgery. She wants to get a handle on her health. She is debating between the sleeve gastrectomy and gastric bypass. She did our Futures trader. Her husband was diagnosed with bone cancer about a year ago and is currently engaged in chemotherapy. His health issues have made her reevaluate her approach to her lifestyle and try to get a handle on her weight. She did see a bariatrician for 6 months last year and was able to lose 60 pounds but has so far regain 30 pounds of it back. She has also tried phentermine as well as qysmia.  Her comorbidities include gastroesophageal reflux, chronic left knee pain, degenerative disc disease of the lumbar spine  She is somewhat interested in the sleeve but is concerned about potential reflux issues after sleeve gastrectomy  She denies any chest pain, chest pressure, shortness of breath, dyspnea on exertion, orthopnea, TIAs or amaurosis fugax. She denies any personal or family  history of blood clots. Her stop bang score was 2.  She does have heartburn. She takes medication daily. She has had a cholecystectomy in 2 C-sections. She has daily bowel movements. She denies any melena or hematochezia. She denies any early satiety or pain with eating or drinking. She has an IUD in. Her children are ages 70 and 97. She denies any dysuria hematuria. She does have chronic left knee pain. She also has lower back pain. She states that she had an x-ray last year which showed degenerative changes in her lower spine. She does have some sciatica radiating to bilateral mid thighs. She denies any migraines, tobacco, alcohol or drug use.   Problem List/Past Medical [name]; 08/16/2016 11:28 AM) GASTROESOPHAGEAL REFLUX DISEASE WITHOUT ESOPHAGITIS (K21.9) OBESITY, MORBID, BMI 40.0-49.9 (E66.01)  Past Surgical History [name]; 08/16/2016 11:28 AM) Cesarean Section - Multiple Gallbladder Surgery - Laparoscopic  Diagnostic Studies History [name]; 08/16/2016 11:28 AM) Colonoscopy never Mammogram within last year Pap Smear 1-5 years ago  Allergies [name]; 08/16/2016 11:28 AM) Amoxicillin-Pot Clavulanate *CHEMICALS* Hives Labetalol HCl *CHEMICALS* Hives Sulfonamide Derivatives Hives Allergies Reconciled  Medication History [name]; 08/16/2016 11:28 AM) Medications Reconciled OxyCODONE HCl ( /5ML Solution, 5-10 Milliliter Oral every four hours, as needed, Taken starting 08/16/2016) Active. Zofran ODT (  Tablet Disint, 1 (one) Tablet Disperse Oral every six hours, as needed, Taken starting 08/16/2016) Active. Albuterol Sulfate HFA (108 (90 Base)MCG/ACT Aerosol Soln, Inhalation as needed) Active. ALPRAZolam (0.5MG  Tablet, Oral daily) Active. ZyrTEC (  Tablet, Oral as needed) Active. Fluticasone Propionate (50MCG/ACT Suspension, Nasal as  needed) Active. Mirena (52 MG) (20MCG/24HR IUD, Intrauterine)  Active. Vilazodone HCl (  Tablet, Oral daily) Active.  Social History [name]; 08/16/2016 11:28 AM) Alcohol use Occasional alcohol use. Caffeine use Carbonated beverages, Tea. No drug use Tobacco use Never smoker.  Family History [name]; 08/16/2016 11:28 AM) Arthritis Father, Mother. Breast Cancer Mother. Hypertension Father.  Pregnancy / Birth History [name]; 08/16/2016 11:28 AM) Age at menarche 11 years. Contraceptive History Intrauterine device. 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 Traycen Goyer Flowers; 08/16/2016 11:27 AM)  General Mental Status-Alert. General Appearance-Consistent with stated age. Hydration-Well hydrated. Voice-Normal. Note: morbidly obese. mostly evenly distributed  Head and Neck Head-normocephalic, atraumatic with no lesions or palpable masses. Trachea-midline. Thyroid Gland Characteristics - normal size and consistency.  Eye Eyeball - Bilateral-Extraocular movements intact. Sclera/Conjunctiva - Bilateral-No scleral icterus.  ENMT Note: normal dentition, normal ext ears  Chest and Lung Exam Chest and lung exam reveals -quiet, even and easy respiratory effort with no use of accessory muscles and on auscultation, normal breath sounds, no adventitious sounds and normal vocal resonance. Inspection Chest Wall - Normal. Back - normal.  Breast - Did not  examine.  Cardiovascular Cardiovascular examination reveals -normal heart sounds, regular rate and rhythm with no murmurs and normal pedal pulses bilaterally.  Abdomen Inspection Inspection of the abdomen reveals - No Hernias. Skin - Scar - no surgical scars. Palpation/Percussion Palpation and Percussion of the abdomen reveal - Soft, Non Tender, No Rebound tenderness, No Rigidity (guarding) and No hepatosplenomegaly. Auscultation Auscultation of the abdomen reveals - Bowel sounds normal.  Peripheral Vascular Upper Extremity Palpation - Pulses bilaterally normal.  Neurologic Neurologic evaluation reveals -alert and oriented x 3 with no impairment of recent or remote memory. Mental Status-Normal.  Neuropsychiatric The patient's mood and affect are described as -normal. Judgment and Insight-insight is appropriate concerning matters relevant to self.  Musculoskeletal Normal Exam - Left-Upper Extremity Strength Normal and Lower Extremity Strength Normal. Normal Exam - Right-Upper Extremity Strength Normal and Lower Extremity Strength Normal. Note: b/l crepitus R>L  Lymphatic Head & Neck  General Head & Neck Lymphatics: Bilateral - Description - Normal. Axillary - Did not examine. Femoral & Inguinal -Note:no palpable LAD.     Assessment & Plan Sabrina Areola M. Kyndall Chaplin Flowers; 08/16/2016 11:26 AM)  OBESITY, MORBID, BMI 40.0-49.9 (E66.01) Impression: We discussed her workup. We discussed the results of the EKG, chest x-ray and upper GI. We discussed the typical hospital course and postoperative course. We rediscussed the typical postoperative complaints such as constipation and fatigue and dehydration. We discussed the typical return to work schedule. She was given her preoperative bowel prep instructions as well as her postoperative prescriptions. All of her questions were asked and answered. I encouraged her to contact the office should she have any additional questions  between now and surgery.  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 [name],  Flowers, FACS General, Bariatric, & Minimally Invasive Surgery [facility], [place]

## 2016-08-29 ENCOUNTER — Encounter (HOSPITAL_COMMUNITY): Payer: Self-pay | Admitting: General Surgery

## 2016-08-29 LAB — CBC WITH DIFFERENTIAL/PLATELET
BASOS ABS: 0 10*3/uL (ref 0.0–0.1)
BASOS PCT: 0 %
EOS ABS: 0 10*3/uL (ref 0.0–0.7)
EOS PCT: 0 %
HCT: 37.6 % (ref 36.0–46.0)
Hemoglobin: 12.9 g/dL (ref 12.0–15.0)
LYMPHS PCT: 10 %
Lymphs Abs: 1.2 10*3/uL (ref 0.7–4.0)
MCH: 30.4 pg (ref 26.0–34.0)
MCHC: 34.3 g/dL (ref 30.0–36.0)
MCV: 88.7 fL (ref 78.0–100.0)
Monocytes Absolute: 1.4 10*3/uL — ABNORMAL HIGH (ref 0.1–1.0)
Monocytes Relative: 11 %
Neutro Abs: 10.1 10*3/uL — ABNORMAL HIGH (ref 1.7–7.7)
Neutrophils Relative %: 79 %
PLATELETS: 284 10*3/uL (ref 150–400)
RBC: 4.24 MIL/uL (ref 3.87–5.11)
RDW: 12.9 % (ref 11.5–15.5)
WBC: 12.7 10*3/uL — AB (ref 4.0–10.5)

## 2016-08-29 LAB — HEMOGLOBIN AND HEMATOCRIT, BLOOD
HEMATOCRIT: 40.8 % (ref 36.0–46.0)
Hemoglobin: 14.4 g/dL (ref 12.0–15.0)

## 2016-08-29 LAB — COMPREHENSIVE METABOLIC PANEL
ALT: 24 U/L (ref 14–54)
AST: 32 U/L (ref 15–41)
Albumin: 3.9 g/dL (ref 3.5–5.0)
Alkaline Phosphatase: 51 U/L (ref 38–126)
Anion gap: 6 (ref 5–15)
BUN: 8 mg/dL (ref 6–20)
CHLORIDE: 105 mmol/L (ref 101–111)
CO2: 28 mmol/L (ref 22–32)
CREATININE: 0.83 mg/dL (ref 0.44–1.00)
Calcium: 8.9 mg/dL (ref 8.9–10.3)
GFR calc Af Amer: >60 mL/min (ref >60)
GFR calc non Af Amer: >60 mL/min (ref >60)
Glucose, Bld: 139 mg/dL — ABNORMAL HIGH (ref 65–99)
Potassium: 4.1 mmol/L (ref 3.5–5.1)
SODIUM: 139 mmol/L (ref 135–145)
Total Bilirubin: 0.4 mg/dL (ref 0.3–1.2)
Total Protein: 6.9 g/dL (ref 6.5–8.1)

## 2016-08-29 MED ORDER — LORATADINE 10 MG PO TABS
10.0000 mg | ORAL_TABLET | Freq: Every day | ORAL | Status: DC
  Administered 2016-08-29: 10 mg via ORAL
  Filled 2016-08-29: qty 1

## 2016-08-29 MED ORDER — FLUTICASONE PROPIONATE 50 MCG/ACT NA SUSP
2.0000 | Freq: Every day | NASAL | Status: DC
  Administered 2016-08-29: 2 via NASAL
  Filled 2016-08-29: qty 16

## 2016-08-29 MED ORDER — OXYCODONE HCL 5 MG/5ML PO SOLN: 5.0000 mg | ORAL | 0 refills | Status: DC | PRN

## 2016-08-29 NOTE — Progress Notes (Signed)
Discharges instructions discussed with patient until no further questions ask. Am assessment unchanged . Patient tolerating liquids well.

## 2016-08-29 NOTE — Progress Notes (Signed)
Patient alert and oriented, Post op day 1.  Provided support and encouragement.  Encouraged pulmonary toilet, ambulation and small sips of liquids.  All questions answered.  Will continue to monitor. 

## 2016-08-29 NOTE — Progress Notes (Signed)
Patient alert and oriented, pain is controlled. Patient is tolerating fluids, advanced to protein shake today, patient is tolerating well. Reviewed Gastric Bypass discharge instructions with patient and patient is able to articulate understanding. Provided information on BELT program, Support Group and [facility] outpatient pharmacy. All questions answered, will continue to monitor.    

## 2016-08-29 NOTE — Plan of Care (Signed)
Problem: Food- and Nutrition-Related Knowledge Deficit (NB-1.1) Goal: Nutrition education Formal process to instruct or train a patient/client in a skill or to impart knowledge to help patients/clients voluntarily manage or modify food choices and eating behavior to maintain or improve health. Outcome: Completed/Met Date Met: 08/29/16 Nutrition Education Note  Received consult for diet education per DROP protocol.   Discussed 2 week post op diet with pt. Emphasized that liquids must be non carbonated, non caffeinated, and sugar free. Fluid goals discussed. Pt to follow up with outpatient bariatric RD for further diet progression after 2 weeks. Multivitamins and minerals also reviewed. Teach back method used, pt expressed understanding, expect good compliance.   Diet: First 2 Weeks  You will see the nutritionist about two (2) weeks after your surgery. The nutritionist will increase the types of foods you can eat if you are handling liquids well:  If you have severe vomiting or nausea and cannot handle clear liquids lasting longer than 1 day, call your surgeon  Protein Shake  Drink at least 2 ounces of shake 5-6 times per day  Each serving of protein shakes (usually 8 - 12 ounces) should have a minimum of:  15 grams of protein  And no more than 5 grams of carbohydrate  Goal for protein each day:  Men = 80 grams per day  Women = 60 grams per day  Protein powder may be added to fluids such as non-fat milk or Lactaid milk or Soy milk (limit to 35 grams added protein powder per serving)   Hydration  Slowly increase the amount of water and other clear liquids as tolerated (See Acceptable Fluids)  Slowly increase the amount of protein shake as tolerated  Sip fluids slowly and throughout the day  May use sugar substitutes in small amounts (no more than 6 - 8 packets per day; i.e. Splenda)   Fluid Goal  The first goal is to drink at least 8 ounces of protein shake/drink per day (or as directed  by the nutritionist); some examples of protein shakes are Premier Protein, [employer], [employer], EAS Edge HP, and Unjury. See handout from pre-op Bariatric Education Class:  Slowly increase the amount of protein shake you drink as tolerated  You may find it easier to slowly sip shakes throughout the day  It is important to get your proteins in first  Your fluid goal is to drink 64 - 100 ounces of fluid daily  It may take a few weeks to build up to this  32 oz (or more) should be clear liquids  And  32 oz (or more) should be full liquids (see below for examples)  Liquids should not contain sugar, caffeine, or carbonation   Clear Liquids:  Water or Sugar-free flavored water (i.e. Fruit H2O, Propel)  Decaffeinated coffee or tea (sugar-free)  Crystal Lite, Wyler's Lite, Minute Maid Lite  Sugar-free Jell-O  Bouillon or broth  Sugar-free Popsicle: *Less than 20 calories each; Limit 1 per day   Full Liquids:  Protein Shakes/Drinks + 2 choices per day of other full liquids  Full liquids must be:  No More Than 12 grams of Carbs per serving  No More Than 3 grams of Fat per serving  Strained low-fat cream soup  Non-Fat milk  Fat-free Lactaid Milk  Sugar-free yogurt (Dannon Lite & Fit, [place] yogurt, Oikos Zero)   Clayton Bibles, MS, RD, LDN Pager: [phone] After Hours Pager: [phone]

## 2016-08-29 NOTE — Progress Notes (Signed)
1 Day Post-Op Laparoscopic Roux-en-Y gastric bypass (ante-colic, ante-gastric); upper endoscopy Subjective: One Day post-op Gastric bypass Moderate intermittent gas pain Mild nausea last night, denies Vomitting  Objective: Vital signs in last 24 hours: Temp:  [97.6 F (36.4 C)-99.2 F (37.3 C)] 98.7 F (37.1 C) (04/17 0502) Pulse Rate:  [45-70] 56 (04/17 0502) Resp:  [11-18] 18 (04/17 0502) BP: (132-183)/(66-108) 141/85 (04/17 0502) SpO2:  [98 %-100 %] 99 % (04/17 0502) Weight:  [108.8 kg (239 lb 14.4 oz)] 108.8 kg (239 lb 14.4 oz) (04/17 0502) Last BM Date: 08/27/16  Intake/Output from previous day: 04/16 0701 - 04/17 0700 In: 2000 [I.V.:2000] Out: 3405 [Urine:3380; Blood:25] Intake/Output this shift: No intake/output data recorded.  General appearance: alert, cooperative, appears stated age and no distress Resp: clear to auscultation bilaterally Cardio: regular rate and rhythm, S1, S2 normal, no murmur, click, rub or gallop GI: soft, non-tender; bowel sounds normal; no masses,  no organomegaly Incision/Wound: Clean, no erythema, no warmth, no induration, no discharge  Lab Results:   Recent Labs  08/28/16 1105 08/29/16 0500  WBC  --  12.7*  HGB 13.1 12.9  HCT 37.9 37.6  PLT  --  284   BMET  Recent Labs  08/29/16 0500  NA 139  K 4.1  CL 105  CO2 28  GLUCOSE 139*  BUN 8  CREATININE 0.83  CALCIUM 8.9   PT/INR No results for input(s): LABPROT, INR in the last 72 hours. ABG No results for input(s): PHART, HCO3 in the last 72 hours.  Invalid input(s): PCO2, PO2  Studies/Results: No results found.  Anti-infectives: Anti-infectives    Start     Dose/Rate Route Frequency Ordered Stop   08/28/16 0703  levofloxacin (LEVAQUIN) 750 MG/150ML IVPB    Comments:  Mirian Mo   : cabinet override      08/28/16 0703 08/28/16 0745   08/28/16 0544  levofloxacin (LEVAQUIN) IVPB 750 mg     750 mg 100 mL/hr over 90 Minutes Intravenous On call to O.R. 08/28/16  0544 08/28/16 0845      Assessment/Plan: s/p Procedure(s): LAPAROSCOPIC ROUX-EN-Y GASTRIC BYPASS WITH UPPER ENDOSCOPY (N/A)   Plan: Sliding scale insulin Once patient hits 12 oz water goal advance diet to protein shakes DVT prof: Ambulatory CBC tomorrow morning Plan for discharge tomorrow  LOS: 1 day    Tremond Shimabukuro PA-S 08/29/2016

## 2016-08-30 NOTE — Discharge Summary (Signed)
Physician Discharge Summary  [name] ZOX:[id number] DOB: 03-24-1976 DOA: 08/28/2016  PCP: Donato Schultz, DO  Admit date: 08/28/2016 Discharge date: 08/29/2016  Recommendations for Outpatient Follow-up:  1.   Follow-up Information    Atilano Ina, MD. Go on 09/20/2016.   Specialty:  General Surgery Why:  at [address] information: [address] [place] [address] [phone]        Atilano Ina, MD Follow up.   Specialty:  General Surgery Contact information: [address] [place] [address] [phone]          Discharge Diagnoses:  Principal Problem:   Severe obesity (BMI >= 40) (HCC) Active Problems:   GERD   Knee pain, left   Lumbar disc disease   S/P gastric bypass   Surgical Procedure: Laparoscopic Roux-en-Y gastric bypass, upper endoscopy  Discharge Condition: Good Disposition: Home  Diet recommendation: Postoperative gastric bypass diet  Filed Weights   08/28/16 0540 08/28/16 0601 08/29/16 0502  Weight: 104.8 kg (231 lb 2 oz) 104.8 kg (231 lb 2 oz) 108.8 kg (239 lb 14.4 oz)     Hospital Course:  The patient was admitted for a planned laparoscopic Roux-en-Y gastric bypass. Please see operative note. Preoperatively the patient was given 5000 units of subcutaneous heparin for DVT prophylaxis. Postoperative prophylactic Lovenox dosing was started on the evening of postoperative day 0.  The patient was started on ice chips and water on evening of POD 0 which they tolerated. On postoperative day 1 The patient's diet was advanced to protein shakes after tolerating 12 oz of water which they also tolerated. The patient was ambulating without difficulty. Their vital signs are stable without fever or tachycardia. Their hemoglobin had remained stable.  The patient had received discharge instructions and counseling. They were deemed stable for discharge.   Discharge Instructions  Discharge Instructions    Ambulate  hourly while awake    Complete by:  As directed    Call MD for:  difficulty breathing, headache or visual disturbances    Complete by:  As directed    Call MD for:  persistant dizziness or light-headedness    Complete by:  As directed    Call MD for:  persistant nausea and vomiting    Complete by:  As directed    Call MD for:  redness, tenderness, or signs of infection (pain, swelling, redness, odor or green/yellow discharge around incision site)    Complete by:  As directed    Call MD for:  severe uncontrolled pain    Complete by:  As directed    Call MD for:  temperature >101 F    Complete by:  As directed    Diet bariatric full liquid    Complete by:  As directed    Discharge instructions    Complete by:  As directed    See bariatric discharge instructions   Incentive spirometry    Complete by:  As directed    Perform hourly while awake     Allergies as of 08/29/2016      Reactions   Amoxicillin Hives   Has patient had a PCN reaction causing immediate rash, facial/tongue/throat swelling, SOB or lightheadedness with hypotension:No Has patient had a PCN reaction causing severe rash involving mucus membranes or skin necrosis:unsure--doesn't think so Has patient had a PCN reaction that required hospitalization:No Has patient had a PCN reaction occurring within the last 10 years:No If all of the above answers  are "NO", then may proceed with Cephalosporin use.   Labetalol Hcl Other (See Comments)   Asthma   Sulfonamide Derivatives Hives   Kidney Disease      Medication List    STOP taking these medications   ibuprofen 200 MG tablet Commonly known as:  ADVIL,MOTRIN   Liraglutide -Weight Management 18 MG/3ML Sopn Commonly known as:  SAXENDA     TAKE these medications   albuterol 108 (90 Base) MCG/ACT inhaler Commonly known as:  PROAIR HFA INHALE 2 PUFFS FOUR TIMES DAILY AS NEEDED What changed:  how much to take  how to take this  when to take this  reasons to  take this  additional instructions   ALPRAZolam 0.5 MG tablet Commonly known as:  XANAX Take 0.5 mg by mouth 2 (two) times daily as needed for anxiety.   cetirizine 10 MG tablet Commonly known as:  ZYRTEC Take 10 mg by mouth at bedtime.   DEXILANT 60 MG capsule Generic drug:  dexlansoprazole TAKE 1 CAPSULE (60 MG TOTAL) BY MOUTH DAILY.   fluticasone 50 MCG/ACT nasal spray Commonly known as:  FLONASE Place 2 sprays into both nostrils daily. What changed:  when to take this   hydrocortisone cream 0.5 % Apply 1 application topically 2 (two) times daily as needed (for ezcema/itchy skin on elbow).   levonorgestrel 20 MCG/24HR IUD Commonly known as:  MIRENA 1 each by Intrauterine route once. Notes to patient:  Remember back up form of birth control needed 30 days after surgery   oxyCODONE 5 MG/5ML solution Commonly known as:  ROXICODONE Take 5-10 mLs (5-10 mg total) by mouth every 4 (four) hours as needed for moderate pain or severe pain.   tretinoin 0.05 % cream Commonly known as:  RETIN-A APPLY TO FACE ONCE DAILY  AT BEDTIME   Vilazodone HCl 40 MG Tabs Commonly known as:  VIIBRYD Take 40 mg by mouth daily.      Follow-up Information    Atilano Ina, MD. Go on 09/20/2016.   Specialty:  General Surgery Why:  at [address] information: [address] [place] [address] [phone]        Atilano Ina, MD Follow up.   Specialty:  General Surgery Contact information: [address] [place] [address] [phone]            The results of significant diagnostics from this hospitalization (including imaging, microbiology, ancillary and laboratory) are listed below for reference.    Significant Diagnostic Studies: No results found.  Labs: Basic Metabolic Panel:  Recent Labs Lab 08/24/16 1355 08/29/16 0500  NA 140 139  K 4.5 4.1  CL 107 105  CO2 27 28  GLUCOSE 100* 139*  BUN 24* 8  CREATININE 0.84 0.83  CALCIUM 9.1  8.9   Liver Function Tests:  Recent Labs Lab 08/24/16 1355 08/29/16 0500  AST 21 32  ALT 13* 24  ALKPHOS 56 51  BILITOT 0.5 0.4  PROT 7.4 6.9  ALBUMIN 4.2 3.9    CBC:  Recent Labs Lab 08/24/16 1355 08/28/16 1105 08/29/16 0500 08/29/16 1548  WBC 7.6  --  12.7*  --   NEUTROABS 5.1  --  10.1*  --   HGB 12.5 13.1 12.9 14.4  HCT 36.8 37.9 37.6 40.8  MCV 85.4  --  88.7  --   PLT 303  --  284  --     CBG: No results for input(s): GLUCAP in the last 168  hours.  Principal Problem:   Severe obesity (BMI >= 40) (HCC) Active Problems:   GERD   Knee pain, left   Lumbar disc disease   S/P gastric bypass   Time coordinating discharge: 15 min  Signed:  Atilano Ina, MD [facility], [place] [phone] 08/30/2016, 7:40 AM

## 2016-08-31 ENCOUNTER — Telehealth (HOSPITAL_COMMUNITY): Payer: Self-pay

## 2016-08-31 NOTE — Telephone Encounter (Signed)
Patient emailed Bariatric nurse with concerns of low grade temperature.  Through discussion it was found the use of IS reduced temperature to normal. Patient encouraged to continue the use of the IS and to call if temperature were to become higher than 101.0.  Patient stated understanding. We also discussed what "Loopy" meant as patient used that to describe how she was feeling.  She stated that she had trouble concentrating, we discussed side effects of surgery effecting concentration as well as sleep patterns.  The patient was alert and oriented through questioning via telephone and able to answer questions appropriately.  She feels safe and does feel that she is a fall risk danger.  Fluid volumes were discussed. Patient has today taken in 24 ounces of clear and a protein shake. She has started a second protein shake. No nausea or pain reported by patient.  Discussed again s/s to report to MD.  Support provided as well as contact information for myself and surgeons office.  Will follow up with patient on Thursday 09/07/16.

## 2016-09-06 ENCOUNTER — Telehealth: Payer: Self-pay | Admitting: Skilled Nursing Facility1

## 2016-09-06 NOTE — Telephone Encounter (Signed)
Pt called back and states 4/30 would work for post-op appointment.

## 2016-09-06 NOTE — Telephone Encounter (Signed)
The pt calls stating she cannot attend post-op class citing her husband has cancer and is dealing with chemo treatments. The scheduler offered the pt options at different times which did not appease the pt. The dietitian called the pt to find a convenient time for her appointment but the pt was unavailable so a message was left.

## 2016-09-07 NOTE — Telephone Encounter (Signed)
Made discharge phone call to patient per DROP protocol. Asking the following questions.    1. Do you have someone to care for you now that you are home?  Independent at work 2. Are you having pain now that is not relieved by your pain medication?  No pain meds 3. Are you able to drink the recommended daily amount of fluids (48 ounces minimum/day) and protein (60-80 grams/day) as prescribed by the dietitian or nutritional counselor?  60 ounces of clear liquid,44 ounces of full over grams of protein yesterday 4. Are you taking the vitamins and minerals as prescribed?  No problem 5. Do you have the "on call" number to contact your surgeon if you have a problem or question?  yes 6. Are your incisions free of redness, swelling or drainage? (If steri strips, address that these can fall off, shower as tolerated) look good 7. Have your bowels moved since your surgery?  If not, are you passing gas?  yes 8. Are you up and walking 3-4 times per day?  yes 9. Were you provided your discharge medications before your surgery or before you were discharged from the hospital and are you taking them without problem?  Has meds no prblems

## 2016-09-11 ENCOUNTER — Encounter: Payer: Self-pay | Admitting: Skilled Nursing Facility1

## 2016-09-11 ENCOUNTER — Encounter: Payer: BC Managed Care – PPO | Attending: General Surgery | Admitting: Skilled Nursing Facility1

## 2016-09-11 DIAGNOSIS — Z6841 Body Mass Index (BMI) 40.0 and over, adult: Secondary | ICD-10-CM | POA: Insufficient documentation

## 2016-09-11 DIAGNOSIS — Z713 Dietary counseling and surveillance: Secondary | ICD-10-CM | POA: Insufficient documentation

## 2016-09-11 NOTE — Progress Notes (Signed)
Bariatric Class:  Appt start time: 2:55 end time:  2:37  2 Week Post-Operative Nutrition Class  Patient was seen on 09/11/2016 for Post-Operative Nutrition education at [facility].   Surgery date: 08/28/2016 Surgery type: RYGB Start weight at [facility]: 235.13 Weight today: 225.4 Weight change: 9.73  [name]  BODY COMP RESULTS  09/11/2016   BMI (kg/m^2) 39.3   Fat Mass (lbs) 107.2   Fat Free Mass (lbs) 118.2   Total Body Water (lbs) 86   The following the learning objectives were met by the patient during this course:  Identifies Phase 3A (Soft, High Proteins) Dietary Goals and will begin from 2 weeks post-operatively to 2 months post-operatively  Identifies appropriate sources of fluids and proteins   States protein recommendations and appropriate sources post-operatively  Identifies the need for appropriate texture modifications, mastication, and bite sizes when consuming solids  Identifies appropriate multivitamin and calcium sources post-operatively  Describes the need for physical activity post-operatively and will follow MD recommendations  States when to call healthcare provider regarding medication questions or post-operative complications  Handouts given during class include:  Phase 3A: Soft, High Protein Diet Handout  Follow-Up Plan: Patient will follow-up at [facility] in 6 weeks for 2 month post-op nutrition visit for diet advancement per MD.

## 2016-09-12 ENCOUNTER — Ambulatory Visit: Payer: BC Managed Care – PPO

## 2016-09-13 ENCOUNTER — Ambulatory Visit: Payer: BC Managed Care – PPO | Admitting: Skilled Nursing Facility1

## 2016-09-14 ENCOUNTER — Ambulatory Visit (INDEPENDENT_AMBULATORY_CARE_PROVIDER_SITE_OTHER): Payer: BC Managed Care – PPO | Admitting: Family Medicine

## 2016-09-14 ENCOUNTER — Encounter: Payer: Self-pay | Admitting: Family Medicine

## 2016-09-14 VITALS — BP 119/81 | HR 85 | Temp 98.5°F | Resp 16 | Ht 62.0 in | Wt 223.4 lb

## 2016-09-14 DIAGNOSIS — K909 Intestinal malabsorption, unspecified: Secondary | ICD-10-CM | POA: Diagnosis not present

## 2016-09-14 DIAGNOSIS — Z9884 Bariatric surgery status: Secondary | ICD-10-CM

## 2016-09-14 LAB — COMPREHENSIVE METABOLIC PANEL
ALBUMIN: 4.3 g/dL (ref 3.5–5.2)
ALT: 14 U/L (ref 0–35)
AST: 19 U/L (ref 0–37)
Alkaline Phosphatase: 56 U/L (ref 39–117)
BUN: 18 mg/dL (ref 6–23)
CHLORIDE: 104 meq/L (ref 96–112)
CO2: 26 mEq/L (ref 19–32)
Calcium: 9.5 mg/dL (ref 8.4–10.5)
Creatinine, Ser: 0.93 mg/dL (ref 0.40–1.20)
GFR: 69.83 mL/min (ref >60.00)
Glucose, Bld: 85 mg/dL (ref 70–99)
POTASSIUM: 4.8 meq/L (ref 3.5–5.1)
SODIUM: 139 meq/L (ref 135–145)
Total Bilirubin: 0.4 mg/dL (ref 0.2–1.2)
Total Protein: 7.1 g/dL (ref 6.0–8.3)

## 2016-09-14 LAB — LIPID PANEL
CHOL/HDL RATIO: 3
Cholesterol: 121 mg/dL (ref 0–200)
HDL: 39.7 mg/dL (ref >39.00)
LDL CALC: 67 mg/dL (ref 0–99)
NONHDL: 81.6
Triglycerides: 74 mg/dL (ref 0.0–149.0)
VLDL: 14.8 mg/dL (ref 0.0–40.0)

## 2016-09-14 LAB — CBC WITH DIFFERENTIAL/PLATELET
BASOS ABS: 0.1 10*3/uL (ref 0.0–0.1)
BASOS PCT: 1 % (ref 0.0–3.0)
EOS ABS: 0.1 10*3/uL (ref 0.0–0.7)
Eosinophils Relative: 1.6 % (ref 0.0–5.0)
HCT: 38 % (ref 36.0–46.0)
HEMOGLOBIN: 12.8 g/dL (ref 12.0–15.0)
LYMPHS PCT: 24.8 % (ref 12.0–46.0)
Lymphs Abs: 2 10*3/uL (ref 0.7–4.0)
MCHC: 33.8 g/dL (ref 30.0–36.0)
MCV: 89.2 fl (ref 78.0–100.0)
MONO ABS: 0.7 10*3/uL (ref 0.1–1.0)
Monocytes Relative: 8.3 % (ref 3.0–12.0)
Neutro Abs: 5.1 10*3/uL (ref 1.4–7.7)
Neutrophils Relative %: 64.3 % (ref 43.0–77.0)
Platelets: 377 10*3/uL (ref 150.0–400.0)
RBC: 4.26 Mil/uL (ref 3.87–5.11)
RDW: 13.1 % (ref 11.5–15.5)
WBC: 7.9 10*3/uL (ref 4.0–10.5)

## 2016-09-14 LAB — TSH: TSH: 2.28 u[IU]/mL (ref 0.35–4.50)

## 2016-09-14 LAB — VITAMIN B12: VITAMIN B 12: 712 pg/mL (ref 211–911)

## 2016-09-14 NOTE — Progress Notes (Signed)
Pre visit review using our clinic review tool, if applicable. No additional management support is needed unless otherwise documented below in the visit note. 

## 2016-09-14 NOTE — Progress Notes (Signed)
Patient ID: [name], female   DOB: 1975/10/27, 41 y.o.   MRN: [id number]     Subjective:    Patient ID: [name], female    DOB: [birth year], 41 y.o.   MRN: [id number]  Chief Complaint  Patient presents with  . GASTRIC BYPASS    FOLLOW UP.    HPI Patient is in today for follow up after her gastric bypass surgery she had on 08/28/16.  Everything is going well, patient has returned to works.  She has no other complaints.  Past Medical History:  Diagnosis Date  . Anxiety   . Asthma   . Chronic kidney disease   . Depression   . Gallstones   . GERD (gastroesophageal reflux disease)   . RAD (reactive airway disease)     Past Surgical History:  Procedure Laterality Date  . CESAREAN SECTION    . CHOLECYSTECTOMY  2007  . 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 Breast cancer Mother   . Hypertension Mother   . Skin cancer Mother   . Pancreatic cancer Maternal Grandfather   . Arthritis Paternal Grandmother   . BRCA 1/2 Paternal Grandmother   . Hypertension Father   . Skin cancer Father   . BRCA 1/2 Cousin   . Diabetes Maternal Grandmother     Social History   Social History  . Marital status: Married    Spouse name: N/A  . Number of children: 2  . Years of education: N/A   Occupational History  . 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 Smoking status: Never Smoker  . Smokeless tobacco: Never Used  . Alcohol use Yes     Comment: rare  . Drug use: No  . Sexual activity: Yes   Other Topics Concern  . Not on file   Social History Narrative  . No narrative on file    Outpatient Medications Prior to Visit  Medication Sig Dispense Refill  . albuterol (PROAIR HFA) 108 (90 BASE) MCG/ACT inhaler INHALE 2 PUFFS FOUR TIMES DAILY AS NEEDED  (Patient taking differently: Inhale 1-2 puffs into the lungs every 6 (six) hours as needed for wheezing or shortness of breath. ) 8.5 g 1  . ALPRAZolam (XANAX) 0.5 MG tablet Take 0.5 mg by mouth 2 (two) times daily as needed for anxiety.     . cetirizine (ZYRTEC) 10 MG tablet Take 10 mg by mouth at bedtime.     Marland Kitchen DEXILANT 60 MG capsule TAKE 1 CAPSULE (60 MG TOTAL) BY MOUTH DAILY. (Patient taking differently: Take 60 mg by mouth daily. ) 90 capsule 3  . fluticasone (FLONASE) 50 MCG/ACT nasal spray Place 2 sprays into both nostrils daily. (Patient taking differently: Place 2 sprays into both nostrils at bedtime. ) 48 g 1  . hydrocortisone cream 0.5 % Apply 1 application topically 2 (two) times daily as needed (for ezcema/itchy skin on elbow).    Marland Kitchen levonorgestrel (MIRENA) 20 MCG/24HR IUD 1 each by Intrauterine route once.    . tretinoin (RETIN-A) 0.05 % cream APPLY TO FACE ONCE DAILY  AT BEDTIME  3  . Vilazodone HCl (VIIBRYD) 40 MG TABS Take 40 mg by mouth daily.    Marland Kitchen oxyCODONE (ROXICODONE) 5 MG/5ML solution Take 5-10 mLs (5-10 mg total) by  mouth every 4 (four) hours as needed for moderate pain or severe pain.  0   No facility-administered medications prior to visit.     Allergies  Allergen Reactions  . Amoxicillin Hives    Has patient had a PCN reaction causing immediate rash, facial/tongue/throat swelling, SOB or lightheadedness with hypotension:No Has patient had a PCN reaction causing severe rash involving mucus membranes or skin necrosis:unsure--doesn't think so Has patient had a PCN reaction that required hospitalization:No Has patient had a PCN reaction occurring within the last 10 years:No If all of the above answers are "NO", then may proceed with Cephalosporin use.   . Labetalol Hcl Other (See Comments)    Asthma  . Sulfonamide Derivatives Hives    Kidney Disease    Review of Systems  Constitutional: Negative for fever and malaise/fatigue.  HENT: Negative for congestion.     Eyes: Negative for blurred vision.  Respiratory: Negative for cough and shortness of breath.   Cardiovascular: Negative for chest pain, palpitations and leg swelling.  Gastrointestinal: Negative for vomiting.  Musculoskeletal: Negative for back pain.  Skin: Negative for rash.  Neurological: Negative for loss of consciousness and headaches.       Objective:    Physical Exam  Constitutional: She is oriented to person, place, and time. She appears well-developed and well-nourished. No distress.  HENT:  Head: Normocephalic and atraumatic.  Eyes: Conjunctivae are normal.  Neck: Normal range of motion. No thyromegaly present.  Cardiovascular: Normal rate and regular rhythm.   Pulmonary/Chest: Effort normal and breath sounds normal. She has no wheezes.  Abdominal: Soft. Bowel sounds are normal. There is no tenderness.    Musculoskeletal: Normal range of motion. She exhibits no edema or deformity.  Neurological: She is alert and oriented to person, place, and time.  Skin: Skin is warm and dry. She is not diaphoretic.  Psychiatric: She has a normal mood and affect.    BP 119/81 (BP Location: Left Arm, Cuff Size: Large)   Pulse 85   Temp 98.5 F (36.9 C) (Oral)   Resp 16   Ht _0  (1.575 m)   Wt 223 lb 6.4 oz (101.3 kg)   SpO2 100%   BMI 40.86 kg/m  Wt Readings from Last 3 Encounters:  09/14/16 223 lb 6.4 oz (101.3 kg)  09/11/16 225 lb 6.4 oz (102.2 kg)  08/29/16 239 lb 14.4 oz (108.8 kg)     Lab Results  Component Value Date   WBC 12.7 (H) 08/29/2016   HGB 14.4 08/29/2016   HCT 40.8 08/29/2016   PLT 284 08/29/2016   GLUCOSE 139 (H) 08/29/2016   CHOL 150 08/30/2015   TRIG 89.0 08/30/2015   HDL 43.30 08/30/2015   LDLDIRECT 154.3 07/28/2010   LDLCALC 89 08/30/2015   ALT 24 08/29/2016   AST 32 08/29/2016   NA 139 08/29/2016   K 4.1 08/29/2016   CL 105 08/29/2016   CREATININE 0.83 08/29/2016   BUN 8 08/29/2016   CO2 28 08/29/2016   TSH 2.75 09/02/2015    Lab  Results  Component Value Date   TSH 2.75 09/02/2015   Lab Results  Component Value Date   WBC 12.7 (H) 08/29/2016   HGB 14.4 08/29/2016   HCT 40.8 08/29/2016   MCV 88.7 08/29/2016   PLT 284 08/29/2016   Lab Results  Component Value Date   NA 139 08/29/2016   K 4.1 08/29/2016   CO2 28 08/29/2016   GLUCOSE 139 (H) 08/29/2016   BUN 8  08/29/2016   CREATININE 0.83 08/29/2016   BILITOT 0.4 08/29/2016   ALKPHOS 51 08/29/2016   AST 32 08/29/2016   ALT 24 08/29/2016   PROT 6.9 08/29/2016   ALBUMIN 3.9 08/29/2016   CALCIUM 8.9 08/29/2016   ANIONGAP 6 08/29/2016   GFR 66.85 08/30/2015   Lab Results  Component Value Date   CHOL 150 08/30/2015   Lab Results  Component Value Date   HDL 43.30 08/30/2015   Lab Results  Component Value Date   LDLCALC 89 08/30/2015   Lab Results  Component Value Date   TRIG 89.0 08/30/2015   Lab Results  Component Value Date   CHOLHDL 3 08/30/2015   No results found for: HGBA1C     Assessment & Plan:   Problem List Items Addressed This Visit    None    Visit Diagnoses    Intestinal malabsorption, unspecified type    -  Primary   Relevant Orders   Lipid panel   CBC with Differential/Platelet   Comprehensive metabolic panel   TSH   Vitamin B12   Vitamin D 1,25 dihydroxy   S/P bariatric surgery        pt is taking a mult vitamin , calcium  f/u surgeon next week   I have discontinued Ms. Bur's oxyCODONE. I am also having her maintain her ALPRAZolam, cetirizine, albuterol, levonorgestrel, Vilazodone HCl, fluticasone, tretinoin, DEXILANT, hydrocortisone cream, and triamcinolone cream.  Meds ordered this encounter  Medications  . triamcinolone cream (KENALOG) 0.1 %     [name], DO

## 2016-09-14 NOTE — Patient Instructions (Signed)
Bariatric Surgery, Care After °Refer to this sheet in the next few weeks. These instructions provide you with information on caring for yourself after your procedure. Your health care provider may also give you more specific instructions. Your treatment has been planned according to current medical practices, but problems sometimes occur. Call your health care provider if you have any problems or questions after your procedure. °What can I expect after the procedure? °After your procedure, it is typical to have the following sensations: °· Soreness. °· Sluggishness. °· Tiredness. °· Moodiness. °· Chilliness. °It is not unusual to have dry skin and some hair loss after bariatric surgery. °Follow these instructions at home: °· Do not drink alcohol, use public transportation, or sign important papers for at least 1 day following surgery. °· Do not resume physical activities or drive until directed by your surgeon. °· Avoid lifting anything over 10 pounds (4.5 kg) for 6 weeks following surgery, or until approved by your surgeon. °· Only take over-the-counter or prescription medicines for pain, discomfort, or fever as directed by your surgeon. °· Resume your diet as directed by your surgeon. You will resume eating with liquids and pureed foods, then soft foods, and progress to a more normal diet over time. Follow your surgeon or dietitian’s guidelines for what and how much to eat and drink. You will need to eat slowly, so as not to cause discomfort and vomiting. °· Use showers for bathing as directed by your surgeon. °· Change dressings as directed by your surgeon. °· Schedule a follow-up appointment with your surgeon as directed. °Where to find more information: °American Society for Bariatric Surgery: www.asbs.org °Weight-control Information Network (WIN): win.niddk.nih.gov °Contact a health care provider if: °· There is redness, swelling, or increasing pain in the wound. °· There is pus coming from the wound. °· There  is drainage from the wound lasting longer than 1 day. °· An unexplained oral temperature above 102°F (38.9°C) develops. °· You notice a foul smell coming from the wound or dressing. °· There is a breaking open of the wound (edges not staying together) after stitches have been removed. °· You notice increasing pain in the shoulder-strap areas. °· You develop episodes of dizziness or faint while standing. °· You develop shortness of breath. °· You develop persistent nausea or vomiting. °· Your soreness seems to be getting worse rather than better. °Get help right away if: °· You develop a rash. °· You have difficulty breathing. °· You develop, or feel you are developing, any reaction or side effects to medicines. °This information is not intended to replace advice given to you by your health care provider. Make sure you discuss any questions you have with your health care provider. °Document Released: 05/01/2005 Document Revised: 10/13/2015 Document Reviewed: 10/30/2012 °Elsevier Interactive Patient Education © 2017 [employer] Inc. ° °

## 2016-09-18 NOTE — Telephone Encounter (Signed)
Pt is currently 3 weeks post-op and having cravings for something crunchy. Pt called inquiring about being able to eat Whisp Crisps which are a crispy snack made of cheese. Pt would like to eat them every so often, as tolerated. Pt states she is tolerating other soft protein foods well and currently not having any GI issues.

## 2016-09-21 LAB — VITAMIN D 1,25 DIHYDROXY
VITAMIN D 1, 25 (OH) TOTAL: 29 pg/mL (ref 18–72)
Vitamin D3 1, 25 (OH)2: 29 pg/mL

## 2016-09-22 ENCOUNTER — Ambulatory Visit: Payer: BC Managed Care – PPO | Admitting: Family Medicine

## 2016-10-23 ENCOUNTER — Encounter: Payer: Self-pay | Admitting: Family Medicine

## 2016-10-23 MED ORDER — DEXILANT 30 MG PO CPDR: 30.0000 mg | DELAYED_RELEASE_CAPSULE | Freq: Every day | ORAL | 0 refills | Status: DC

## 2016-10-23 NOTE — Telephone Encounter (Signed)
Rx sent 

## 2016-10-23 NOTE — Telephone Encounter (Signed)
Change to dexilant 30 mg  #30  1 po qd --- she should be able to stop in a few weeks

## 2016-10-25 ENCOUNTER — Encounter: Payer: BC Managed Care – PPO | Attending: General Surgery | Admitting: Skilled Nursing Facility1

## 2016-10-25 ENCOUNTER — Encounter: Payer: Self-pay | Admitting: Skilled Nursing Facility1

## 2016-10-25 ENCOUNTER — Ambulatory Visit: Payer: BC Managed Care – PPO | Admitting: Skilled Nursing Facility1

## 2016-10-25 DIAGNOSIS — Z713 Dietary counseling and surveillance: Secondary | ICD-10-CM | POA: Diagnosis present

## 2016-10-25 DIAGNOSIS — Z6841 Body Mass Index (BMI) 40.0 and over, adult: Secondary | ICD-10-CM | POA: Diagnosis not present

## 2016-10-25 DIAGNOSIS — E669 Obesity, unspecified: Secondary | ICD-10-CM

## 2016-10-25 NOTE — Progress Notes (Signed)
  Follow-up visit:  8 Weeks Post-Operative RYGB Surgery  Medical Nutrition Therapy:  Appt start time: 9:06 end time: 9:40  Primary concerns today: Post-operative Bariatric Surgery Nutrition Management.  Pt is taking a separate vitamin D for being low. Pt states she got the RYGB to cure her GERD. Pt states she is taking medication every day for GERD which is helping; to wean off dexilant. Pt states she is tracking her food on my fitness pal getting about 600 calories a day. Pt states she is sick of chicken and eggs.  Pt states her stomach hurt yesterday. Pt states she does not eat protein and beef but does eat tofu and beans. Pt states she started with a running group and will start BELT. Pt states she has been feeling very tired recently. Pt states her IUD will be taken out due to it being at the end of 5 years. Pt states she Will try cooking her tofu. Pt arrives with complaints of being tired of eating the same thing and admits she does not cook which limits her options.  Surgery date: 08/28/2016 Surgery type: RYGB [facility]13 Weight today: 208.2 Weight change: 17.2  [name]  BODY COMP RESULTS  09/11/2016 10/25/2016   BMI (kg/m^2) 39.3 38.1   Fat Mass (lbs) 107.2 101   Fat Free Mass (lbs) 118.2 107.2   Total Body Water (lbs) 86 77.6    24-hr recall: B (AM): protein shake 30g Snk (AM):  L (PM): chicken skewers  Snk (PM): raspberry yogurt  D (PM): protein shake 30g Snk (PM):   Fluid intake: water 36, powerade 32 zero, propel 16.9: 84.9 fluid ounces  Estimated total protein intake: 50-96  Medications: See List Supplementation: Opurity and 3 calcium's  Using straws: no Drinking while eating: no Having you been chewing well:no Chewing/swallowing difficulties: no Changes in vision: no Changes to mood/headaches: no Hair loss/Cahnges to skin/Changes to nails: no Any difficulty focusing or concentrating: no Sweating: no Dizziness/Lightheaded: no Palpitations: no   Carbonated beverages: no N/V/D/C/GAS: no Abdominal Pain: no Dumping syndrome: no  Recent physical activity:  Mondays and wendays-running group, walking/biking: 5 days a week-doing some hand weights and beginner DVD's: 30-45 minutes   Progress Towards Goal(s):  In progress.  Handouts given during visit include:  Given vegetarian options for proteins    Nutritional Diagnosis:  [place]-3.3 Overweight/obesity related to past poor dietary habits and physical inactivity as evidenced by patient w/ recent RYGB surgery following dietary guidelines for continued weight loss.    Intervention:  Nutrition counseling.Dietitian educated the pt on advancing her diet to include non-starchy vegetables. Goals: -Caffeine is fine  -Eat all of your protein first then start in on your vegetables -Listen to your body and eat slowly, chew until applesauce consistency   Teaching Method Utilized:  Visual Auditory Hands on  Barriers to learning/adherence to lifestyle change: lack of willingness to cook   Demonstrated degree of understanding via:  Teach Back   Monitoring/Evaluation:  Dietary intake, exercise, lap band fills, and body weight.

## 2016-10-25 NOTE — Patient Instructions (Addendum)
-  Caffeine is fine    -Eat all of your protein first then start in on your vegetables    -Listen to your body and eat slowly, chew until applesauce consistency

## 2016-11-24 ENCOUNTER — Other Ambulatory Visit: Payer: Self-pay | Admitting: Family Medicine

## 2016-11-30 ENCOUNTER — Telehealth: Payer: Self-pay | Admitting: Family Medicine

## 2016-11-30 NOTE — Telephone Encounter (Signed)
Pt called in to schedule a post surgery visit with PCP. Pt says that she had bypass surgery and hasnt been seen since. Pt says that last night she stood up and passed out. I scheduled pt an apt. With PCP for tomorrow.   ALSO, transferred call to team health for further triaging.

## 2016-11-30 NOTE — Telephone Encounter (Signed)
[facility] - Client TELEPHONE ADVICE RECORD [facility]  Patient Name: [name]  DOB: 12/06/1975    Initial Comment Caller says she has had bypass surgery and she passed out around mid night. Caller does have an apt made for tomorrow at 11:30am. She does states she has been working out a lot and does not know if that has anything to do with it   Nurse Assessment  Nurse: Laural BenesJohnson, RN, Dondra SpryGail Date/Time (Eastern Time): 11/30/2016 12:56:42 PM  Confirm and document reason for call. If symptomatic, describe symptoms. ---[name] had gastric bipass in April -- doing well up until last evening- started new exercise class that is tough M, W, F and worked out pretty strenuous - woke up at 1245 stomach hurt got up to get drink of water -- nauseated and dizziness went back to bed and thought she was getting in bed and passed out and went to the floor aroused with dog jumping around her --once aroused everything passed.  Does the patient have any new or worsening symptoms? ---Yes  Will a triage be completed? ---Yes  Related visit to physician within the last 2 weeks? ---No  Does the PT have any chronic conditions? (i.e. diabetes, asthma, etc.) ---Yes  List chronic conditions. ---Gastric Bipass  Is the patient pregnant or possibly pregnant? (Ask all females between the ages of 3412-55) ---No  Is this a behavioral health or substance abuse call? ---No     Guidelines    Guideline Title Affirmed Question Affirmed Notes  Fainting [1] All other patients AND [2] now alert and feels fine (Exception: SIMPLE FAINT due to stress, pain, prolonged standing, or suddenly standing)    Final Disposition User   See Physician within 4 Hours (or PCP triage) Laural BenesJohnson, RN, Dondra SpryGail    Comments  NOTE: has appt scheduled tomorrow for 1130am 12/01/2016 with Lowne-Chase -- MD -- no appts today to accommodate a 4 hour hour --   Referrals  REFERRED TO PCP OFFICE   Disagree/Comply:  Disagree  Disagree/Comply Reason: Disagree with instructions

## 2016-12-01 ENCOUNTER — Encounter: Payer: Self-pay | Admitting: Family Medicine

## 2016-12-01 ENCOUNTER — Ambulatory Visit (INDEPENDENT_AMBULATORY_CARE_PROVIDER_SITE_OTHER): Payer: BC Managed Care – PPO | Admitting: Family Medicine

## 2016-12-01 VITALS — BP 124/80 | HR 68 | Temp 98.0°F | Resp 16 | Ht 62.0 in | Wt 196.6 lb

## 2016-12-01 DIAGNOSIS — F322 Major depressive disorder, single episode, severe without psychotic features: Secondary | ICD-10-CM

## 2016-12-01 DIAGNOSIS — Z9884 Bariatric surgery status: Secondary | ICD-10-CM

## 2016-12-01 DIAGNOSIS — R55 Syncope and collapse: Secondary | ICD-10-CM | POA: Diagnosis not present

## 2016-12-01 DIAGNOSIS — R42 Dizziness and giddiness: Secondary | ICD-10-CM

## 2016-12-01 LAB — CBC WITH DIFFERENTIAL/PLATELET
BASOS ABS: 0 10*3/uL (ref 0.0–0.1)
Basophils Relative: 0.6 % (ref 0.0–3.0)
EOS ABS: 0.1 10*3/uL (ref 0.0–0.7)
Eosinophils Relative: 1.3 % (ref 0.0–5.0)
HCT: 38.8 % (ref 36.0–46.0)
Hemoglobin: 12.9 g/dL (ref 12.0–15.0)
LYMPHS ABS: 1.6 10*3/uL (ref 0.7–4.0)
Lymphocytes Relative: 21.6 % (ref 12.0–46.0)
MCHC: 33.3 g/dL (ref 30.0–36.0)
MCV: 93.2 fl (ref 78.0–100.0)
MONO ABS: 0.6 10*3/uL (ref 0.1–1.0)
Monocytes Relative: 8 % (ref 3.0–12.0)
NEUTROS ABS: 5.2 10*3/uL (ref 1.4–7.7)
NEUTROS PCT: 68.5 % (ref 43.0–77.0)
PLATELETS: 277 10*3/uL (ref 150.0–400.0)
RBC: 4.16 Mil/uL (ref 3.87–5.11)
RDW: 14.8 % (ref 11.5–15.5)
WBC: 7.5 10*3/uL (ref 4.0–10.5)

## 2016-12-01 LAB — VITAMIN B12: VITAMIN B 12: 461 pg/mL (ref 211–911)

## 2016-12-01 LAB — VITAMIN D 25 HYDROXY (VIT D DEFICIENCY, FRACTURES): VITD: 50.34 ng/mL (ref 30.00–100.00)

## 2016-12-01 LAB — TSH: TSH: 2.22 u[IU]/mL (ref 0.35–4.50)

## 2016-12-01 NOTE — Patient Instructions (Signed)
Syncope °Syncope is when you temporarily lose consciousness. Syncope may also be called fainting or passing out. It is caused by a sudden decrease in blood flow to the brain. Even though most causes of syncope are not dangerous, syncope can be a sign of a serious medical problem. Signs that you may be about to faint include: °· Feeling dizzy or light-headed. °· Feeling nauseous. °· Seeing all white or all black in your field of vision. °· Having cold, clammy skin. °If you fainted, get medical help right away.Call your local emergency services (911 in the [place] Do not drive yourself to the hospital. °Follow these instructions at home: °Pay attention to any changes in your symptoms. Take these actions to help with your condition: °· Have someone stay with you until you feel stable. °· Do not drive, use machinery, or play sports until your health care provider says it is okay. °· Keep all follow-up visits as told by your health care provider. This is important. °· If you start to feel like you might faint, lie down right away and raise (elevate) your feet above the level of your heart. Breathe deeply and steadily. Wait until all of the symptoms have passed. °· Drink enough fluid to keep your urine clear or pale yellow. °· If you are taking blood pressure or heart medicine, get up slowly and take several minutes to sit and then stand. This can reduce dizziness. °· Take over-the-counter and prescription medicines only as told by your health care provider. °Get help right away if: °· You have a severe headache. °· You have unusual pain in your chest, abdomen, or back. °· You are bleeding from your mouth or rectum, or you have black or tarry stool. °· You have a very fast or irregular heartbeat (palpitations). °· You have pain with breathing. °· You faint once or repeatedly. °· You have a seizure. °· You are confused. °· You have trouble walking. °· You have severe weakness. °· You have vision problems. °These symptoms  may represent a serious problem that is an emergency. Do not wait to see if your symptoms will go away. Get medical help right away. Call your local emergency services (911 in the [place] Do not drive yourself to the hospital. °This information is not intended to replace advice given to you by your health care provider. Make sure you discuss any questions you have with your health care provider. °Document Released: 05/01/2005 Document Revised: 10/07/2015 Document Reviewed: 01/13/2015 °Elsevier Interactive Patient Education © 2017 Elsevier Inc. ° °

## 2016-12-01 NOTE — Progress Notes (Signed)
Patient ID: [name], female   DOB: 30-Oct-1975, 41 y.o.   MRN: [id number]     Subjective:  I acted as a Education administrator for Dr. Carollee Herter.  Guerry Bruin, Tiro   Patient ID: [name], female    DOB: [birth year], 41 y.o.   MRN: [id number]  Chief Complaint  Patient presents with  . passed out    HPI  Patient is in today because she passed out early yesterday morning.  Note from called patient made to nurse on yesterday:  "She had gastric bipass in April -- doing well up until last evening- started new exercise class that is tough M, W, F and worked out pretty strenuous - woke up at 1245 stomach hurt got up to get drink of water -- nauseated and dizziness went back to bed and thought she was getting in bed and passed out and went to the floor aroused with dog jumping around her --once aroused everything passed."  Today she feels very worn out, fatigued and dizzy.  She has been very overwhelmed at work and home and had gastric bypass -- so the restricted calories and exercise class she thinks may be part of the cause.  Patient Care Team: Carollee Herter, Alferd Apa, DO as PCP - General Druscilla Brownie, MD as Consulting Physician (Dermatology)   Past Medical History:  Diagnosis Date  . Anxiety   . Asthma   . Chronic kidney disease   . Depression   . Gallstones   . GERD (gastroesophageal reflux disease)   . RAD (reactive airway disease)     Past Surgical History:  Procedure Laterality Date  . CESAREAN SECTION    . CHOLECYSTECTOMY  2007  . 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 Breast cancer Mother   . Hypertension Mother   . Skin cancer Mother   . Pancreatic cancer Maternal Grandfather   . Arthritis Paternal Grandmother   . BRCA 1/2 Paternal Grandmother   . Hypertension Father   .  Skin cancer Father   . BRCA 1/2 Cousin   . Diabetes Maternal Grandmother     Social History   Social History  . Marital status: Married    Spouse name: N/A  . Number of children: 2  . Years of education: N/A   Occupational History  . 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 Smoking status: Never Smoker  . Smokeless tobacco: Never Used  . Alcohol use Yes     Comment: rare  . Drug use: No  . Sexual activity: Yes   Other Topics Concern  . Not on file   Social History Narrative  . No narrative on file    Outpatient Medications Prior to Visit  Medication Sig Dispense Refill  . albuterol (PROAIR HFA) 108 (90 BASE) MCG/ACT inhaler INHALE 2 PUFFS FOUR TIMES DAILY AS NEEDED (Patient taking differently: Inhale 1-2 puffs into the lungs every 6 (six) hours as needed for wheezing or shortness of breath. ) 8.5 g 1  . ALPRAZolam (XANAX) 0.5 MG tablet Take 0.5 mg by mouth 2 (two) times daily as needed for anxiety.     . cetirizine (ZYRTEC) 10 MG tablet Take 10 mg by mouth at bedtime.     Marland Kitchen DEXILANT 30 MG capsule TAKE 1 CAPSULE BY  MOUTH EVERY DAY 30 capsule 0  . fluticasone (FLONASE) 50 MCG/ACT nasal spray Place 2 sprays into both nostrils daily. (Patient taking differently: Place 2 sprays into both nostrils at bedtime. ) 48 g 1  . hydrocortisone cream 0.5 % Apply 1 application topically 2 (two) times daily as needed (for ezcema/itchy skin on elbow).    Marland Kitchen levonorgestrel (MIRENA) 20 MCG/24HR IUD 1 each by Intrauterine route once.    . tretinoin (RETIN-A) 0.05 % cream APPLY TO FACE ONCE DAILY  AT BEDTIME  3  . triamcinolone cream (KENALOG) 0.1 %     . Vilazodone HCl (VIIBRYD) 40 MG TABS Take 40 mg by mouth daily.     No facility-administered medications prior to visit.     Allergies  Allergen Reactions  . Amoxicillin Hives    Has patient had a PCN reaction causing immediate rash, facial/tongue/throat swelling, SOB or lightheadedness with hypotension:No Has patient had a  PCN reaction causing severe rash involving mucus membranes or skin necrosis:unsure--doesn't think so Has patient had a PCN reaction that required hospitalization:No Has patient had a PCN reaction occurring within the last 10 years:No If all of the above answers are "NO", then may proceed with Cephalosporin use.   . Labetalol Hcl Other (See Comments)    Asthma  . Sulfonamide Derivatives Hives    Kidney Disease    Review of Systems  Constitutional: Positive for malaise/fatigue. Negative for fever.  HENT: Negative for congestion.   Eyes: Negative for blurred vision.  Respiratory: Negative for cough and shortness of breath.   Cardiovascular: Negative for chest pain, palpitations and leg swelling.  Gastrointestinal: Negative for vomiting.  Musculoskeletal: Negative for back pain.  Skin: Negative for rash.  Neurological: Positive for dizziness. Negative for loss of consciousness and headaches.       Objective:    Physical Exam  Constitutional: She is oriented to person, place, and time. She appears well-developed and well-nourished.  HENT:  Head: Normocephalic and atraumatic.  Eyes: Conjunctivae and EOM are normal.  Neck: Normal range of motion. Neck supple. No JVD present. Carotid bruit is not present. No thyromegaly present.  Cardiovascular: Normal rate, regular rhythm and normal heart sounds.   No murmur heard. Pulmonary/Chest: Effort normal and breath sounds normal. No respiratory distress. She has no wheezes. She has no rales. She exhibits no tenderness.  Musculoskeletal: She exhibits no edema.  Neurological: She is alert and oriented to person, place, and time. She has normal strength. No cranial nerve deficit or sensory deficit. Gait normal.  Psychiatric: Her behavior is normal. Judgment and thought content normal. Her mood appears anxious. Her affect is not angry, not blunt, not labile and not inappropriate. Cognition and memory are normal. She exhibits a depressed mood.    Nursing note and vitals reviewed.   BP 124/80 (BP Location: Right Arm, Cuff Size: Large)   Pulse 68   Temp 98 F (36.7 C) (Oral)   Resp 16   Ht _0  (1.575 m)   Wt 196 lb 9.6 oz (89.2 kg)   SpO2 97%   BMI 35.96 kg/m  Wt Readings from Last 3 Encounters:  12/01/16 196 lb 9.6 oz (89.2 kg)  10/25/16 208 lb 3.2 oz (94.4 kg)  09/14/16 223 lb 6.4 oz (101.3 kg)   BP Readings from Last 3 Encounters:  12/01/16 124/80  09/14/16 119/81  08/29/16 (!) 160/79     Immunization History  Administered Date(s) Administered  . Influenza,inj,Quad PF,36+ Mos 04/24/2013, 06/01/2014  . Influenza-Unspecified 02/21/2015,  01/24/2016  . Td 08/24/2004  . Tdap 10/11/2011    Health Maintenance  Topic Date Due  . HIV Screening  05/21/1988  . MAMMOGRAM  11/12/2016  . PAP SMEAR  11/12/2016  . INFLUENZA VACCINE  12/13/2016  . TETANUS/TDAP  10/10/2021    Lab Results  Component Value Date   WBC 7.5 12/01/2016   HGB 12.9 12/01/2016   HCT 38.8 12/01/2016   PLT 277.0 12/01/2016   GLUCOSE 85 09/14/2016   CHOL 121 09/14/2016   TRIG 74.0 09/14/2016   HDL 39.70 09/14/2016   LDLDIRECT 154.3 07/28/2010   LDLCALC 67 09/14/2016   ALT 14 09/14/2016   AST 19 09/14/2016   NA 139 09/14/2016   K 4.8 09/14/2016   CL 104 09/14/2016   CREATININE 0.93 09/14/2016   BUN 18 09/14/2016   CO2 26 09/14/2016   TSH 2.22 12/01/2016    Lab Results  Component Value Date   TSH 2.22 12/01/2016   Lab Results  Component Value Date   WBC 7.5 12/01/2016   HGB 12.9 12/01/2016   HCT 38.8 12/01/2016   MCV 93.2 12/01/2016   PLT 277.0 12/01/2016   Lab Results  Component Value Date   NA 139 09/14/2016   K 4.8 09/14/2016   CO2 26 09/14/2016   GLUCOSE 85 09/14/2016   BUN 18 09/14/2016   CREATININE 0.93 09/14/2016   BILITOT 0.4 09/14/2016   ALKPHOS 56 09/14/2016   AST 19 09/14/2016   ALT 14 09/14/2016   PROT 7.1 09/14/2016   ALBUMIN 4.3 09/14/2016   CALCIUM 9.5 09/14/2016   ANIONGAP 6 08/29/2016   GFR  69.83 09/14/2016   Lab Results  Component Value Date   CHOL 121 09/14/2016   Lab Results  Component Value Date   HDL 39.70 09/14/2016   Lab Results  Component Value Date   LDLCALC 67 09/14/2016   Lab Results  Component Value Date   TRIG 74.0 09/14/2016   Lab Results  Component Value Date   CHOLHDL 3 09/14/2016   No results found for: HGBA1C       Assessment & Plan:   Problem List Items Addressed This Visit      Unprioritized   H/O gastric bypass    Other Visit Diagnoses    Current severe episode of major depressive disorder without psychotic features without prior episode ([place])    -  Primary   Dizziness       Relevant Orders   EKG 12-Lead (Completed)   CBC with Differential/Platelet (Completed)   TSH (Completed)   Vitamin B12 (Completed)   POCT Urinalysis Dipstick (Automated)   Vitamin D (25 hydroxy) (Completed)   Syncope, unspecified syncope type       Relevant Orders   CBC with Differential/Platelet (Completed)   TSH (Completed)   Vitamin B12 (Completed)   POCT Urinalysis Dipstick (Automated)   Vitamin D (25 hydroxy) (Completed)    pt will inform psych and surgeon about incident rto prn Pt will make sure she is getting enough cal / fluids in con't viibryd Pt will speak to psych about extended time out of work    I am having Ms. Moller maintain her ALPRAZolam, cetirizine, albuterol, levonorgestrel, Vilazodone HCl, fluticasone, tretinoin, hydrocortisone cream, triamcinolone cream, and DEXILANT.  No orders of the defined types were placed in this encounter.   CMA served as Education administrator during this visit. History, Physical and Plan performed by medical provider. Documentation and orders reviewed and attested to.  [name], DO

## 2016-12-04 ENCOUNTER — Encounter: Payer: Self-pay | Admitting: Family Medicine

## 2016-12-05 NOTE — Telephone Encounter (Signed)
Pt dropped off paperwork to be filled out [facility]([employer] Certification Form-2 pages [facility](FMLA)) Pt would like to be call to pick up at [facility] [phone]. Document put at front office tray.

## 2016-12-23 ENCOUNTER — Other Ambulatory Visit: Payer: Self-pay | Admitting: Family Medicine

## 2016-12-25 ENCOUNTER — Encounter: Payer: Self-pay | Admitting: Skilled Nursing Facility1

## 2016-12-25 ENCOUNTER — Encounter: Payer: BC Managed Care – PPO | Attending: General Surgery | Admitting: Skilled Nursing Facility1

## 2016-12-25 DIAGNOSIS — Z713 Dietary counseling and surveillance: Secondary | ICD-10-CM | POA: Insufficient documentation

## 2016-12-25 DIAGNOSIS — Z6841 Body Mass Index (BMI) 40.0 and over, adult: Secondary | ICD-10-CM | POA: Insufficient documentation

## 2016-12-25 NOTE — Progress Notes (Signed)
RYGB Surgery  Medical Nutrition Therapy:  Appt start time: 9:06 end time: 9:40  Primary concerns today: Post-operative Bariatric Surgery Nutrition Management.  Pt states she has decreased her dexilant trying to get off of it. Pt states she had some trouble with baby carrots just sitting. Pt states she tried BELT but it was too early. Pt states her husbands chemo ends this week. Pt states she passed out after getting up in the middle of the night. Pt states she wants to try the capsule form of her multivitamin. Pt asked if she could eat nuts. Pt states eggs make her feel gross now. Pt states she is very tired due to stress. Pt states the only thing she is not happy with is her under chin waddle but she really she is okay with ot.   Surgery date: 08/28/2016 Surgery type[facility]YGB [facility] Weight today: 186.6 Weight change: 21.6  [name]  BODY COMP RESULTS  09/11/2016 10/25/2016 12/25/2016   BMI (kg/m^2) 39.3 38.1 34.1   Fat Mass (lbs) 107.2 101 83   Fat Free Mass (lbs) 118.2 107.2 103.6   Total Body Water (lbs) 86 77.6 74.2    24-hr recall: B (AM): protein shake 30g Snk (AM):  L (PM): chicken skewers ---yogurt or cottage cheese Snk (PM): dried chic peas D (PM):chicken and vegetables Snk (PM): cottage cheese  Fluid intake: water 67.6, powerade 32 zero, propel 16.9, unsweet tea 33.8:  fluid ounces  Estimated total protein intake: 96  Medications: See List Supplementation: Opurity and 3 calcium's  Using straws: no Drinking while eating: no Having you been chewing well:no Chewing/swallowing difficulties: no Changes in vision: no Changes to mood/headaches: no Hair loss/Changes to skin/Changes to nails: some shedding to hair Any difficulty focusing or concentrating: no Sweating: no Dizziness/Lightheaded: no Palpitations: no  Carbonated beverages: no N/V/D/C/GAS: no Abdominal Pain: no Dumping syndrome: no  Recent physical activity:  Mondays and  wendays-running group, walking/biking: 5 days a week-doing some hand weights and beginner DVD's: 30-45 minutes   Progress Towards Goal(s):  In progress.   Nutritional Diagnosis:  [place]-3.3 Overweight/obesity related to past poor dietary habits and physical inactivity as evidenced by patient w/ recent RYGB surgery following dietary guidelines for continued weight loss.    Intervention:  Nutrition counseling.Dietitian educated the pt on advancing her diet to include non-starchy vegetables. Goals: -Eat all of your protein first then start in on your vegetables -Listen to your body and eat slowly, chew until applesauce consistency  -Add in one to one half serving of fruit   Teaching Method Utilized:  Visual Auditory Hands on  Barriers to learning/adherence to lifestyle change: lack of willingness to cook   Demonstrated degree of understanding via:  Teach Back   Monitoring/Evaluation:  Dietary intake, exercise, and body weight.

## 2017-01-04 NOTE — Addendum Note (Signed)
Addendum  created 01/04/17 1307 by Raynald Rouillard, MD   Sign clinical note    

## 2017-03-02 ENCOUNTER — Encounter: Payer: Self-pay | Admitting: Family Medicine

## 2017-03-02 ENCOUNTER — Ambulatory Visit (INDEPENDENT_AMBULATORY_CARE_PROVIDER_SITE_OTHER): Payer: BC Managed Care – PPO | Admitting: Family Medicine

## 2017-03-02 VITALS — BP 130/88 | HR 80 | Temp 98.7°F | Ht 64.0 in | Wt 170.0 lb

## 2017-03-02 DIAGNOSIS — Z23 Encounter for immunization: Secondary | ICD-10-CM | POA: Diagnosis not present

## 2017-03-02 DIAGNOSIS — L02412 Cutaneous abscess of left axilla: Secondary | ICD-10-CM

## 2017-03-02 MED ORDER — DOXYCYCLINE HYCLATE 100 MG PO TABS: 100.0000 mg | ORAL_TABLET | Freq: Two times a day (BID) | ORAL | 0 refills | Status: DC

## 2017-03-02 NOTE — Patient Instructions (Signed)

## 2017-03-02 NOTE — Progress Notes (Signed)
Subjective:    Patient ID: [name], female    DOB: 29-Apr-1976, 41 y.o.   MRN: [id number]  Chief Complaint  Patient presents with  . Cyst    Onset about a week ago. Pt switched to an natural deoderant and developed a cyst    HPI Patient is in today for abscess in L axilla x 2  After changing deoderant.    Past Medical History:  Diagnosis Date  . Anxiety   . Asthma   . Chronic kidney disease   . Depression   . Gallstones   . GERD (gastroesophageal reflux disease)   . RAD (reactive airway disease)     Past Surgical History:  Procedure Laterality Date  . CESAREAN SECTION    . CHOLECYSTECTOMY  2007  . 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 Breast cancer Mother   . Hypertension Mother   . Skin cancer Mother   . Pancreatic cancer Maternal Grandfather   . Arthritis Paternal Grandmother   . BRCA 1/2 Paternal Grandmother   . Hypertension Father   . Skin cancer Father   . BRCA 1/2 Cousin   . Diabetes Maternal Grandmother     Social History   Social History  . Marital status: Married    Spouse name: N/A  . Number of children: 2  . Years of education: N/A   Occupational History  . 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 Smoking status: Never Smoker  . Smokeless tobacco: Never Used  . Alcohol use Yes     Comment: rare  . Drug use: No  . Sexual activity: Yes   Other Topics Concern  . Not on file   Social History Narrative  . No narrative on file    Outpatient Medications Prior to Visit  Medication Sig Dispense Refill  . albuterol (PROAIR HFA) 108 (90 BASE) MCG/ACT inhaler INHALE 2 PUFFS FOUR TIMES DAILY AS NEEDED (Patient taking differently: Inhale 1-2 puffs into the lungs every 6 (six) hours as needed for wheezing or shortness of  breath. ) 8.5 g 1  . ALPRAZolam (XANAX) 0.5 MG tablet Take 0.5 mg by mouth 2 (two) times daily as needed for anxiety.     . cetirizine (ZYRTEC) 10 MG tablet Take 10 mg by mouth at bedtime.     Marland Kitchen DEXILANT 30 MG capsule TAKE 1 CAPSULE BY MOUTH EVERY DAY 30 capsule 5  . fluticasone (FLONASE) 50 MCG/ACT nasal spray Place 2 sprays into both nostrils daily. (Patient taking differently: Place 2 sprays into both nostrils at bedtime. ) 48 g 1  . hydrocortisone cream 0.5 % Apply 1 application topically 2 (two) times daily as needed (for ezcema/itchy skin on elbow).    Marland Kitchen levonorgestrel (MIRENA) 20 MCG/24HR IUD 1 each by Intrauterine route once.    . tretinoin (RETIN-A) 0.05 % cream APPLY TO FACE ONCE DAILY  AT BEDTIME  3  . triamcinolone cream (KENALOG) 0.1 %     . Vilazodone HCl (VIIBRYD) 40 MG TABS Take 40 mg by mouth daily.     No facility-administered medications prior to visit.     Allergies  Allergen Reactions  . Amoxicillin Hives    Has patient had a PCN reaction causing immediate rash, facial/tongue/throat swelling, SOB or lightheadedness with  hypotension:No Has patient had a PCN reaction causing severe rash involving mucus membranes or skin necrosis:unsure--doesn't think so Has patient had a PCN reaction that required hospitalization:No Has patient had a PCN reaction occurring within the last 10 years:No If all of the above answers are "NO", then may proceed with Cephalosporin use.   . Labetalol Hcl Other (See Comments)    Asthma  . Sulfonamide Derivatives Hives    Kidney Disease    Review of Systems  Constitutional: Negative for chills, fever and malaise/fatigue.  HENT: Negative for congestion and hearing loss.   Eyes: Negative for discharge.  Respiratory: Negative for cough, sputum production and shortness of breath.   Cardiovascular: Negative for chest pain, palpitations and leg swelling.  Gastrointestinal: Negative for abdominal pain, blood in stool, constipation, diarrhea,  heartburn, nausea and vomiting.  Genitourinary: Negative for dysuria, frequency, hematuria and urgency.  Musculoskeletal: Negative for back pain, falls and myalgias.  Skin: Negative for rash.  Neurological: Negative for dizziness, sensory change, loss of consciousness, weakness and headaches.  Endo/Heme/Allergies: Negative for environmental allergies. Does not bruise/bleed easily.  Psychiatric/Behavioral: Negative for depression and suicidal ideas. The patient is not nervous/anxious and does not have insomnia.        Objective:    Physical Exam  Skin:  L axilla --+ abscess ? x2 -- tender no drainage   Nursing note and vitals reviewed.   BP 130/88   Pulse 80   Temp 98.7 F (37.1 C)   Ht '5\' 4"'$  (1.626 m)   Wt 170 lb (77.1 kg)   SpO2 98%   BMI 29.18 kg/m  Wt Readings from Last 3 Encounters:  03/02/17 170 lb (77.1 kg)  12/25/16 166 lb 9.6 oz (75.6 kg)  12/01/16 196 lb 9.6 oz (89.2 kg)     Lab Results  Component Value Date   WBC 7.5 12/01/2016   HGB 12.9 12/01/2016   HCT 38.8 12/01/2016   PLT 277.0 12/01/2016   GLUCOSE 85 09/14/2016   CHOL 121 09/14/2016   TRIG 74.0 09/14/2016   HDL 39.70 09/14/2016   LDLDIRECT 154.3 07/28/2010   LDLCALC 67 09/14/2016   ALT 14 09/14/2016   AST 19 09/14/2016   NA 139 09/14/2016   K 4.8 09/14/2016   CL 104 09/14/2016   CREATININE 0.93 09/14/2016   BUN 18 09/14/2016   CO2 26 09/14/2016   TSH 2.22 12/01/2016    Lab Results  Component Value Date   TSH 2.22 12/01/2016   Lab Results  Component Value Date   WBC 7.5 12/01/2016   HGB 12.9 12/01/2016   HCT 38.8 12/01/2016   MCV 93.2 12/01/2016   PLT 277.0 12/01/2016   Lab Results  Component Value Date   NA 139 09/14/2016   K 4.8 09/14/2016   CO2 26 09/14/2016   GLUCOSE 85 09/14/2016   BUN 18 09/14/2016   CREATININE 0.93 09/14/2016   BILITOT 0.4 09/14/2016   ALKPHOS 56 09/14/2016   AST 19 09/14/2016   ALT 14 09/14/2016   PROT 7.1 09/14/2016   ALBUMIN 4.3 09/14/2016    CALCIUM 9.5 09/14/2016   ANIONGAP 6 08/29/2016   GFR 69.83 09/14/2016   Lab Results  Component Value Date   CHOL 121 09/14/2016   Lab Results  Component Value Date   HDL 39.70 09/14/2016   Lab Results  Component Value Date   LDLCALC 67 09/14/2016   Lab Results  Component Value Date   TRIG 74.0 09/14/2016   Lab Results  Component Value  Date   CHOLHDL 3 09/14/2016   No results found for: HGBA1C     Assessment & Plan:   Problem List Items Addressed This Visit    None    Visit Diagnoses    Abscess of axilla, left    -  Primary   Relevant Medications   doxycycline (VIBRA-TABS) 100 MG tablet   Need for immunization against influenza          I am having [name] start on doxycycline. I am also having her maintain her ALPRAZolam, cetirizine, albuterol, levonorgestrel, Vilazodone HCl, fluticasone, tretinoin, hydrocortisone cream, triamcinolone cream, and DEXILANT.  Meds ordered this encounter  Medications  . doxycycline (VIBRA-TABS) 100 MG tablet    Sig: Take 1 tablet (100 mg total) by mouth 2 (two) times daily.    Dispense:  20 tablet    Refill:  0     [name], DO

## 2017-03-13 ENCOUNTER — Encounter: Payer: Self-pay | Admitting: Family Medicine

## 2017-04-25 ENCOUNTER — Encounter: Payer: Self-pay | Admitting: Family Medicine

## 2017-04-25 NOTE — Telephone Encounter (Signed)
Do we have the labs? We can start b12 injections monthly and recheck In 3 months

## 2017-04-30 ENCOUNTER — Ambulatory Visit: Payer: BC Managed Care – PPO | Admitting: Skilled Nursing Facility1

## 2017-05-11 ENCOUNTER — Ambulatory Visit: Payer: BC Managed Care – PPO

## 2017-05-17 ENCOUNTER — Ambulatory Visit (INDEPENDENT_AMBULATORY_CARE_PROVIDER_SITE_OTHER): Payer: BC Managed Care – PPO

## 2017-05-17 DIAGNOSIS — E538 Deficiency of other specified B group vitamins: Secondary | ICD-10-CM | POA: Diagnosis not present

## 2017-05-17 MED ORDER — CYANOCOBALAMIN 1000 MCG/ML IJ SOLN
1000.0000 ug | Freq: Once | INTRAMUSCULAR | Status: AC
  Administered 2017-05-17: 1000 ug via INTRAMUSCULAR

## 2017-05-17 NOTE — Progress Notes (Signed)
Pre visit review using our clinic tool,if applicable. No additional management support is needed unless otherwise documented below in the visit note.   Patient in for B12 injection per order from Dr. Marya FossaY, Low[place]-Chase. Patient has B12 deficiency.   Given 1000 mcg IM left deltoid. Patient tolerated well. Return appointment given for 1 month.

## 2017-05-30 ENCOUNTER — Encounter: Payer: Self-pay | Admitting: Skilled Nursing Facility1

## 2017-05-30 ENCOUNTER — Encounter: Payer: BC Managed Care – PPO | Attending: General Surgery | Admitting: Skilled Nursing Facility1

## 2017-05-30 DIAGNOSIS — Z713 Dietary counseling and surveillance: Secondary | ICD-10-CM | POA: Insufficient documentation

## 2017-05-30 NOTE — Patient Instructions (Addendum)
-  Eat your whole banana   -

## 2017-05-30 NOTE — Progress Notes (Signed)
RYGB Surgery  Medical Nutrition Therapy:  Appt start time: 9:06 end time: 9:40  Primary concerns today: Post-operative Bariatric Surgery Nutrition Management.  Pt states she is extremely tired/fatigued. Pt states she is now taking a b12 shot every week. Pt states 1 hour after eating cake she felt shaky and sweaty and then had orange juice and peanut butter and felt better. Pts B12 has been steadily getting lower since May 2018 at 712 to now being 390.   Pt arrives moving slowly, pale, and slightly hunched: not her normal condition.   Surgery date: 08/28/2016 Surgery type: [facility]GB [facility] Weight today: 153.8 Weight change: 32.8  [name]  BODY COMP RESULTS  09/11/2016 10/25/2016 12/25/2016 05/30/2016   BMI (kg/m^2) 39.3 38.1 34.1 28.1   Fat Mass (lbs) 107.2 101 83 46   Fat Free Mass (lbs) 118.2 107.2 103.6 107.8   Total Body Water (lbs) 86 77.6 74.2 75.6    24-hr recall: B (AM): protein shake 30g and half baana Snk (10:30AM): cottage cheese L (PM): cheese and RX bar Snk (PM): dried chic peas D (PM):turkey and cheese sandwich on low carb bread Snk (PM): cottage cheese  Fluid intake: water 67.6, powerade 32 zero, propel 16.9, unsweet tea 33.8:  fluid ounces  Estimated total protein intake: 96  Medications: See List Supplementation: Opurity and 3 calcium's and D3  Using straws: no Drinking while eating: no Having you been chewing well:no Chewing/swallowing difficulties: no Changes in vision: no Changes to mood/headaches: no Hair loss/Changes to skin/Changes to nails: some shedding to hair Any difficulty focusing or concentrating: no Sweating: no Dizziness/Lightheaded: no Palpitations: no  Carbonated beverages: no N/V/D/C/GAS: no Abdominal Pain: no Dumping syndrome: no  Recent physical activity: running 3-4 miles and weight lifting   Progress Towards Goal(s):  In progress.   Nutritional Diagnosis:  [place]-3.3 Overweight/obesity related to past poor  dietary habits and physical inactivity as evidenced by patient w/ recent RYGB surgery following dietary guidelines for continued weight loss.    Intervention:  Nutrition counseling.Dietitian educated the pt on advancing her diet to include non-starchy vegetables. Goals: -Eat all of your protein first then start in on your vegetables -Listen to your body and eat slowly, chew until applesauce consistency  -Eat all foods: fuel your workouts  -Have balanced meals throughout the day  Teaching Method Utilized:  Visual Auditory Hands on  Barriers to learning/adherence to lifestyle change: lack of willingness to cook   Demonstrated degree of understanding via:  Teach Back   Monitoring/Evaluation:  Dietary intake, exercise, and body weight.

## 2017-06-08 ENCOUNTER — Encounter: Payer: Self-pay | Admitting: Family Medicine

## 2017-06-08 ENCOUNTER — Ambulatory Visit: Payer: BC Managed Care – PPO | Admitting: Family Medicine

## 2017-06-08 VITALS — BP 110/78 | HR 85 | Temp 98.3°F | Resp 16 | Ht 64.0 in | Wt 149.0 lb

## 2017-06-08 DIAGNOSIS — J0110 Acute frontal sinusitis, unspecified: Secondary | ICD-10-CM | POA: Diagnosis not present

## 2017-06-08 DIAGNOSIS — J302 Other seasonal allergic rhinitis: Secondary | ICD-10-CM

## 2017-06-08 DIAGNOSIS — R509 Fever, unspecified: Secondary | ICD-10-CM

## 2017-06-08 LAB — POCT INFLUENZA A/B
Influenza A, POC: NEGATIVE
Influenza B, POC: NEGATIVE

## 2017-06-08 MED ORDER — FLUTICASONE PROPIONATE 50 MCG/ACT NA SUSP
2.0000 | Freq: Every day | NASAL | 5 refills | Status: DC
Start: 2017-06-08 — End: 2017-11-24

## 2017-06-08 MED ORDER — DOXYCYCLINE HYCLATE 100 MG PO TABS: 100.0000 mg | ORAL_TABLET | Freq: Two times a day (BID) | ORAL | 0 refills | Status: DC

## 2017-06-08 MED ORDER — FLUTICASONE PROPIONATE 50 MCG/ACT NA SUSP: 2.0000 | Freq: Every day | NASAL | 6 refills | Status: DC

## 2017-06-08 NOTE — Patient Instructions (Signed)

## 2017-06-08 NOTE — Progress Notes (Signed)
vPatient ID: [name], female   DOB: 1975-08-31, 42 y.o.   MRN: [id number]    Subjective:  I acted as a Education administrator for Dr. Carollee Herter.  Guerry Bruin, [place]   Patient ID: [name], female    DOB: 03/01/1976, 42 y.o.   MRN: [id number]  Chief Complaint  Patient presents with  . Sinusitis    Sinusitis  This is a new problem. Episode onset: sunday. Associated symptoms include chills, congestion, headaches, neck pain and sinus pressure. Pertinent negatives include no coughing or sore throat. (Chest tightness ) Treatments tried: echineaca tea, cold eze logenzes.    Patient is in today for sinus pressure.  Patient Care Team: Carollee Herter, Alferd Apa, DO as PCP - General Druscilla Brownie, MD as Consulting Physician (Dermatology)   Past Medical History:  Diagnosis Date  . Anxiety   . Asthma   . Chronic kidney disease   . Depression   . Gallstones   . GERD (gastroesophageal reflux disease)   . RAD (reactive airway disease)     Past Surgical History:  Procedure Laterality Date  . CESAREAN SECTION    . CHOLECYSTECTOMY  2007  . 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 Breast cancer Mother   . Hypertension Mother   . Skin cancer Mother   . Pancreatic cancer Maternal Grandfather   . Arthritis Paternal Grandmother   . BRCA 1/2 Paternal Grandmother   . Hypertension Father   . Skin cancer Father   . BRCA 1/2 Cousin   . Diabetes Maternal Grandmother     Social History   Socioeconomic History  . Marital status: Married    Spouse name: Not on file  . Number of children: 2  . Years of education: Not on file  . Highest education level: Not on file  Social Needs  . Financial resource strain: Not on file  . Food insecurity - worry: Not on file  . Food insecurity - inability: Not  on file  . Transportation needs - medical: Not on file  . Transportation needs - non-medical: Not on file  Occupational History  . Occupation: Games developer: [employer]  Tobacco Use  . Smoking status: Never Smoker  . Smokeless tobacco: Never Used  Substance and Sexual Activity  . Alcohol use: Yes    Comment: rare  . Drug use: No  . Sexual activity: Yes  Other Topics Concern  . Not on file  Social History Narrative  . Not on file    Outpatient Medications Prior to Visit  Medication Sig Dispense Refill  . albuterol (PROAIR HFA) 108 (90 BASE) MCG/ACT inhaler INHALE 2 PUFFS FOUR TIMES DAILY AS NEEDED (Patient taking differently: Inhale 1-2 puffs into the lungs every 6 (six) hours as needed for wheezing or shortness of breath. ) 8.5 g 1  . ALPRAZolam (XANAX) 0.5 MG tablet Take 0.5 mg by mouth 2 (two) times daily as needed for anxiety.     . cetirizine (ZYRTEC) 10 MG tablet Take 10 mg by mouth at bedtime.     Marland Kitchen DEXILANT 30 MG capsule TAKE 1 CAPSULE BY MOUTH EVERY DAY 30 capsule 5  . hydrocortisone cream 0.5 % Apply 1 application topically 2 (two) times daily as needed (for ezcema/itchy skin on elbow).    Marland Kitchen  levonorgestrel (MIRENA) 20 MCG/24HR IUD 1 each by Intrauterine route once.    . tretinoin (RETIN-A) 0.05 % cream APPLY TO FACE ONCE DAILY  AT BEDTIME  3  . triamcinolone cream (KENALOG) 0.1 %     . Vilazodone HCl (VIIBRYD) 40 MG TABS Take 40 mg by mouth daily.    . fluticasone (FLONASE) 50 MCG/ACT nasal spray Place 2 sprays into both nostrils daily. (Patient taking differently: Place 2 sprays into both nostrils at bedtime. ) 48 g 1  . doxycycline (VIBRA-TABS) 100 MG tablet Take 1 tablet (100 mg total) by mouth 2 (two) times daily. 20 tablet 0   No facility-administered medications prior to visit.     Allergies  Allergen Reactions  . Amoxicillin Hives    Has patient had a PCN reaction causing immediate rash, facial/tongue/throat swelling, SOB or lightheadedness with  hypotension:No Has patient had a PCN reaction causing severe rash involving mucus membranes or skin necrosis:unsure--doesn't think so Has patient had a PCN reaction that required hospitalization:No Has patient had a PCN reaction occurring within the last 10 years:No If all of the above answers are "NO", then may proceed with Cephalosporin use.   . Labetalol Hcl Other (See Comments)    Asthma  . Sulfonamide Derivatives Hives    Kidney Disease    Review of Systems  Constitutional: Positive for chills.  HENT: Positive for congestion and sinus pressure. Negative for sore throat.   Respiratory: Negative for cough.   Musculoskeletal: Positive for neck pain.  Neurological: Positive for headaches.       Objective:    Physical Exam  Constitutional: She is oriented to person, place, and time. She appears well-developed and well-nourished.  HENT:  Right Ear: External ear normal.  Left Ear: External ear normal.  + PND + errythema  Eyes: Conjunctivae are normal. Right eye exhibits no discharge. Left eye exhibits no discharge.  Cardiovascular: Normal rate, regular rhythm and normal heart sounds.  No murmur heard. Pulmonary/Chest: Effort normal and breath sounds normal. No respiratory distress. She has no wheezes. She has no rales. She exhibits no tenderness.  Musculoskeletal: She exhibits no edema.  Lymphadenopathy:    She has cervical adenopathy.  Neurological: She is alert and oriented to person, place, and time.  Nursing note and vitals reviewed.   BP 110/78 (BP Location: Right Arm, Cuff Size: Normal)   Pulse 85   Temp 98.3 F (36.8 C) (Oral)   Resp 16   Ht 5' 4" (1.626 m)   Wt 149 lb (67.6 kg)   SpO2 98%   BMI 25.58 kg/m  Wt Readings from Last 3 Encounters:  06/08/17 149 lb (67.6 kg)  05/30/17 153 lb 12.8 oz (69.8 kg)  03/02/17 170 lb (77.1 kg)   BP Readings from Last 3 Encounters:  06/08/17 110/78  03/02/17 130/88  12/01/16 124/80     Immunization History    Administered Date(s) Administered  . Influenza,inj,Quad PF,6+ Mos 04/24/2013, 06/01/2014  . Influenza-Unspecified 02/21/2015, 01/24/2016  . Td 08/24/2004  . Tdap 10/11/2011    Health Maintenance  Topic Date Due  . HIV Screening  05/21/1988  . MAMMOGRAM  11/12/2016  . PAP SMEAR  11/12/2016  . TETANUS/TDAP  10/10/2021  . INFLUENZA VACCINE  Completed    Lab Results  Component Value Date   WBC 7.5 12/01/2016   HGB 12.9 12/01/2016   HCT 38.8 12/01/2016   PLT 277.0 12/01/2016   GLUCOSE 85 09/14/2016   CHOL 121 09/14/2016   TRIG 74.0 09/14/2016  HDL 39.70 09/14/2016   LDLDIRECT 154.3 07/28/2010   LDLCALC 67 09/14/2016   ALT 14 09/14/2016   AST 19 09/14/2016   NA 139 09/14/2016   K 4.8 09/14/2016   CL 104 09/14/2016   CREATININE 0.93 09/14/2016   BUN 18 09/14/2016   CO2 26 09/14/2016   TSH 2.22 12/01/2016    Lab Results  Component Value Date   TSH 2.22 12/01/2016   Lab Results  Component Value Date   WBC 7.5 12/01/2016   HGB 12.9 12/01/2016   HCT 38.8 12/01/2016   MCV 93.2 12/01/2016   PLT 277.0 12/01/2016   Lab Results  Component Value Date   NA 139 09/14/2016   K 4.8 09/14/2016   CO2 26 09/14/2016   GLUCOSE 85 09/14/2016   BUN 18 09/14/2016   CREATININE 0.93 09/14/2016   BILITOT 0.4 09/14/2016   ALKPHOS 56 09/14/2016   AST 19 09/14/2016   ALT 14 09/14/2016   PROT 7.1 09/14/2016   ALBUMIN 4.3 09/14/2016   CALCIUM 9.5 09/14/2016   ANIONGAP 6 08/29/2016   GFR 69.83 09/14/2016   Lab Results  Component Value Date   CHOL 121 09/14/2016   Lab Results  Component Value Date   HDL 39.70 09/14/2016   Lab Results  Component Value Date   LDLCALC 67 09/14/2016   Lab Results  Component Value Date   TRIG 74.0 09/14/2016   Lab Results  Component Value Date   CHOLHDL 3 09/14/2016   No results found for: HGBA1C    flu test --- neg   Assessment & Plan:   Problem List Items Addressed This Visit    None    Visit Diagnoses    Fever, unspecified  fever cause    -  Primary   Relevant Orders   POCT Influenza A/B (Completed)   Seasonal allergic rhinitis       Relevant Medications   fluticasone (FLONASE) 50 MCG/ACT nasal spray   Acute frontal sinusitis, recurrence not specified       Relevant Medications   fluticasone (FLONASE) 50 MCG/ACT nasal spray   doxycycline (VIBRA-TABS) 100 MG tablet   fluticasone (FLONASE) 50 MCG/ACT nasal spray      I have discontinued Shalene K. Deady's doxycycline. I have also changed her fluticasone. Additionally, I am having her start on doxycycline and fluticasone. Lastly, I am having her maintain her ALPRAZolam, cetirizine, albuterol, levonorgestrel, Vilazodone HCl, tretinoin, hydrocortisone cream, triamcinolone cream, and DEXILANT.  Meds ordered this encounter  Medications  . fluticasone (FLONASE) 50 MCG/ACT nasal spray    Sig: Place 2 sprays into both nostrils at bedtime.    Dispense:  16 g    Refill:  5  . doxycycline (VIBRA-TABS) 100 MG tablet    Sig: Take 1 tablet (100 mg total) by mouth 2 (two) times daily.    Dispense:  20 tablet    Refill:  0  . fluticasone (FLONASE) 50 MCG/ACT nasal spray    Sig: Place 2 sprays into both nostrils daily.    Dispense:  16 g    Refill:  6    CMA served as scribe during this visit. History, Physical and Plan performed by medical provider. Documentation and orders reviewed and attested to.  [name], DO

## 2017-06-13 ENCOUNTER — Encounter: Payer: Self-pay | Admitting: Family Medicine

## 2017-06-14 NOTE — Telephone Encounter (Signed)
levaquin 500mg qd x 10 days

## 2017-06-18 ENCOUNTER — Other Ambulatory Visit: Payer: Self-pay | Admitting: Family Medicine

## 2017-06-19 ENCOUNTER — Ambulatory Visit (INDEPENDENT_AMBULATORY_CARE_PROVIDER_SITE_OTHER): Payer: BC Managed Care – PPO

## 2017-06-19 DIAGNOSIS — E538 Deficiency of other specified B group vitamins: Secondary | ICD-10-CM

## 2017-06-19 MED ORDER — CYANOCOBALAMIN 1000 MCG/ML IJ SOLN
1000.0000 ug | Freq: Once | INTRAMUSCULAR | Status: AC
  Administered 2017-06-19: 1000 ug via INTRAMUSCULAR

## 2017-06-19 NOTE — Progress Notes (Signed)
Pt came in today to receive her B12 shot. Given to Pt in Right arm at 1543  without complication. Pt states she's in a rush today to take her dog to  The vet and will call back to schedule her next shot for 4 weeks from today as advised.

## 2017-06-20 ENCOUNTER — Other Ambulatory Visit: Payer: Self-pay

## 2017-06-20 MED ORDER — DEXILANT 30 MG PO CPDR: 30.0000 mg | DELAYED_RELEASE_CAPSULE | Freq: Every day | ORAL | 5 refills | Status: DC

## 2017-06-25 NOTE — Telephone Encounter (Signed)
Looks like below antibiotic was never sent in. Sent mychart message to pt to see if she continues to have symptoms.

## 2017-07-17 ENCOUNTER — Ambulatory Visit: Payer: Self-pay | Admitting: Family Medicine

## 2017-07-17 ENCOUNTER — Ambulatory Visit: Payer: BC Managed Care – PPO

## 2017-07-23 ENCOUNTER — Ambulatory Visit: Payer: BC Managed Care – PPO | Admitting: Family Medicine

## 2017-07-31 ENCOUNTER — Ambulatory Visit: Payer: BC Managed Care – PPO | Admitting: Family Medicine

## 2017-08-07 ENCOUNTER — Ambulatory Visit (INDEPENDENT_AMBULATORY_CARE_PROVIDER_SITE_OTHER): Payer: BC Managed Care – PPO | Admitting: *Deleted

## 2017-08-07 DIAGNOSIS — E538 Deficiency of other specified B group vitamins: Secondary | ICD-10-CM

## 2017-08-07 MED ORDER — CYANOCOBALAMIN 1000 MCG/ML IJ SOLN
1000.0000 ug | Freq: Once | INTRAMUSCULAR | Status: AC
  Administered 2017-08-07: 1000 ug via INTRAMUSCULAR

## 2017-08-07 NOTE — Progress Notes (Signed)
Reviewed  Jameica Couts R Lowne Chase, DO  

## 2017-08-07 NOTE — Progress Notes (Signed)
Pre visit review using our clinic review tool, if applicable. No additional management support is needed unless otherwise documented below in the visit note.  Pt here for B12 injection per Dr Zola ButtonLowne Chase.  B12 1000 mcg given IM, left deltoid and pt tolerated injection well.  Next B12 injection scheduled for 09/07/17 at 4pm.

## 2017-08-08 ENCOUNTER — Emergency Department (HOSPITAL_BASED_OUTPATIENT_CLINIC_OR_DEPARTMENT_OTHER)
Admission: EM | Admit: 2017-08-08 | Discharge: 2017-08-08 | Disposition: A | Discharge status: Home / Self Care | Payer: BC Managed Care – PPO | Attending: Emergency Medicine | Admitting: Emergency Medicine

## 2017-08-08 ENCOUNTER — Encounter (HOSPITAL_BASED_OUTPATIENT_CLINIC_OR_DEPARTMENT_OTHER): Payer: Self-pay | Admitting: Emergency Medicine

## 2017-08-08 ENCOUNTER — Other Ambulatory Visit: Payer: Self-pay

## 2017-08-08 DIAGNOSIS — R1033 Periumbilical pain: Secondary | ICD-10-CM | POA: Insufficient documentation

## 2017-08-08 DIAGNOSIS — R109 Unspecified abdominal pain: Secondary | ICD-10-CM

## 2017-08-08 DIAGNOSIS — Z79899 Other long term (current) drug therapy: Secondary | ICD-10-CM | POA: Insufficient documentation

## 2017-08-08 DIAGNOSIS — N189 Chronic kidney disease, unspecified: Secondary | ICD-10-CM | POA: Insufficient documentation

## 2017-08-08 DIAGNOSIS — J45909 Unspecified asthma, uncomplicated: Secondary | ICD-10-CM | POA: Insufficient documentation

## 2017-08-08 LAB — URINALYSIS, ROUTINE W REFLEX MICROSCOPIC
Bilirubin Urine: NEGATIVE
GLUCOSE, UA: NEGATIVE mg/dL
Hgb urine dipstick: NEGATIVE
Ketones, ur: NEGATIVE mg/dL
LEUKOCYTES UA: NEGATIVE
NITRITE: NEGATIVE
PROTEIN: NEGATIVE mg/dL
Specific Gravity, Urine: <1.005 — ABNORMAL LOW (ref 1.005–1.030)
pH: 5.5 (ref 5.0–8.0)

## 2017-08-08 LAB — PREGNANCY, URINE: PREG TEST UR: NEGATIVE

## 2017-08-08 LAB — LIPASE, BLOOD: LIPASE: 28 U/L (ref 11–51)

## 2017-08-08 LAB — COMPREHENSIVE METABOLIC PANEL
ALT: 33 U/L (ref 14–54)
AST: 31 U/L (ref 15–41)
Albumin: 4.8 g/dL (ref 3.5–5.0)
Alkaline Phosphatase: 76 U/L (ref 38–126)
Anion gap: 11 (ref 5–15)
BUN: 16 mg/dL (ref 6–20)
CHLORIDE: 99 mmol/L — AB (ref 101–111)
CO2: 27 mmol/L (ref 22–32)
Calcium: 9.5 mg/dL (ref 8.9–10.3)
Creatinine, Ser: 0.77 mg/dL (ref 0.44–1.00)
Glucose, Bld: 90 mg/dL (ref 65–99)
Potassium: 4.2 mmol/L (ref 3.5–5.1)
Sodium: 137 mmol/L (ref 135–145)
TOTAL PROTEIN: 7.9 g/dL (ref 6.5–8.1)
Total Bilirubin: 0.6 mg/dL (ref 0.3–1.2)

## 2017-08-08 LAB — CBC
HCT: 39.7 % (ref 36.0–46.0)
Hemoglobin: 13.9 g/dL (ref 12.0–15.0)
MCH: 32.3 pg (ref 26.0–34.0)
MCHC: 35 g/dL (ref 30.0–36.0)
MCV: 92.1 fL (ref 78.0–100.0)
PLATELETS: 264 10*3/uL (ref 150–400)
RBC: 4.31 MIL/uL (ref 3.87–5.11)
RDW: 12.6 % (ref 11.5–15.5)
WBC: 5.8 10*3/uL (ref 4.0–10.5)

## 2017-08-08 NOTE — ED Provider Notes (Signed)
[facility] EMERGENCY DEPARTMENT Provider Note   CSN: [id number] Arrival date & time: 08/08/17  1032     History   Chief Complaint Chief Complaint  Patient presents with  . Abdominal Pain    HPI [name] is a 42 y.o. female.  HPI   Pt is a 42 y/o female who presents to the ED c/o periumbilical abd pain that began while she was at work PTA. States that initially she felt like she had gas pain as she has had similar episodes in the past after she had a gastric bypass last year. However, pain seemed worse today so she came to the ED. States pain was 8-9/10 at is worse. States pain was sharp. States she took a gas x and work and since then has been belching and passing gas and pain has improved significantly. States pain is about 2/10 now across periumbilical area. No fevers, chest pain, SOB, NVD, constipation, urinary or vaginal sxs. No pelvic pain. Cannot remember LMP, has IUD and menses are irregular.    Pt states she drank echinacea tea PTA to help treat recent uri sxs.  Past Medical History:  Diagnosis Date  . Anxiety   . Asthma   . Chronic kidney disease   . Depression   . Gallstones   . GERD (gastroesophageal reflux disease)   . RAD (reactive airway disease)     Patient Active Problem List   Diagnosis Date Noted  . 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  . Lumbar disc disease 08/28/2016  . 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 Maxillary sinusitis, acute 06/09/2015  . Left acute otitis media 06/09/2015  . Knee pain, left 03/22/2015  . Conjunctivitis 03/27/2014  . Ear pain 03/27/2014  . Dyspepsia and other specified disorders of function of stomach 06/02/2013  . Anxiety state, unspecified 06/02/2013  . Severe obesity (BMI >= 40) ([place]) 12/24/2012  . VITAMIN B12 DEFICIENCY 10/16/2008  . DEPRESSION 09/04/2006  . GERD 09/04/2006  . COLPOSCOPY, HX OF 09/04/2006    Past Surgical History:  Procedure Laterality Date  . CESAREAN SECTION    . CHOLECYSTECTOMY  2007   . 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   Procedure: LAPAROSCOPIC ROUX-EN-Y GASTRIC BYPASS WITH UPPER ENDOSCOPY;  Surgeon: Greer Pickerel, MD;  Location: [facility];  Service: General;  Laterality: N/A;     OB History   None      Home Medications    Prior to Admission medications   Medication Sig Start Date End Date Taking? Authorizing Provider  albuterol (PROAIR HFA) 108 (90 BASE) MCG/ACT inhaler INHALE 2 PUFFS FOUR TIMES DAILY AS NEEDED Patient taking differently: Inhale 1-2 puffs into the lungs every 6 (six) hours as needed for wheezing or shortness of breath.  12/12/10   [name], DO  ALPRAZolam Duanne Moron) 0.5 MG tablet Take 0.5 mg by mouth 2 (two) times daily as needed for anxiety.     [provider]  cetirizine (ZYRTEC) 10 MG tablet Take 10 mg by mouth at bedtime.     [provider]  DEXILANT 30 MG capsule Take 1 capsule (30 mg total) by mouth daily. 06/20/17   [name], DO  doxycycline (VIBRA-TABS) 100 MG tablet Take 1 tablet (100 mg total) by mouth 2 (two) times daily. 06/08/17   Roma Schanz R, DO  fluticasone (FLONASE) 50 MCG/ACT nasal spray Place 2 sprays into both nostrils at bedtime. 06/08/17   Roma Schanz R, DO  fluticasone (FLONASE) 50 MCG/ACT nasal  spray Place 2 sprays into both nostrils daily. 06/08/17   [name], DO  hydrocortisone cream 0.5 % Apply 1 application topically 2 (two) times daily as needed (for ezcema/itchy skin on elbow).    [provider]  levonorgestrel (MIRENA) 20 MCG/24HR IUD 1 each by Intrauterine route once.    [provider]  tretinoin (RETIN-A) 0.05 % cream APPLY TO FACE ONCE DAILY  AT BEDTIME 06/12/16   [provider]  triamcinolone cream (KENALOG) 0.1 %  06/12/16   [provider]  Vilazodone HCl (VIIBRYD) 40 MG TABS Take 40 mg by mouth daily.    [provider]    Family History Family History  Problem Relation Age of Onset  . Breast cancer  Mother   . Hypertension Mother   . Skin cancer Mother   . Pancreatic cancer Maternal Grandfather   . Arthritis Paternal Grandmother   . BRCA 1/2 Paternal Grandmother   . Hypertension Father   . Skin cancer Father   . BRCA 1/2 Cousin   . Diabetes Maternal Grandmother     Social History Social History   Tobacco Use  . Smoking status: Never Smoker  . Smokeless tobacco: Never Used  Substance Use Topics  . Alcohol use: Yes    Comment: rare  . Drug use: No     Allergies   Amoxicillin; Labetalol hcl; and Sulfonamide derivatives   Review of Systems Review of Systems  Constitutional: Negative for chills and fever.  HENT: Negative for ear pain and sore throat.   Eyes: Negative for visual disturbance.  Respiratory: Negative for cough and shortness of breath.   Cardiovascular: Negative for chest pain and palpitations.  Gastrointestinal: Positive for abdominal pain. Negative for blood in stool, constipation, diarrhea, nausea and vomiting.  Genitourinary: Negative for decreased urine volume, dysuria, flank pain, frequency, hematuria, pelvic pain, urgency and vaginal bleeding.  Musculoskeletal: Negative for arthralgias and back pain.  Skin: Negative for color change and rash.  Neurological: Negative for syncope and headaches.  All other systems reviewed and are negative.    Physical Exam Updated Vital Signs BP 138/77 (BP Location: Right Arm)   Pulse (!) 59   Temp 98 F (36.7 C) (Oral)   Resp 16   Ht _0  (1.626 m)   Wt 63.5 kg (140 lb)   SpO2 100%   BMI 24.03 kg/m   Physical Exam  Constitutional: She appears well-developed and well-nourished. No distress.  Nontoxic, NAD  HENT:  Head: Normocephalic and atraumatic.  Mouth/Throat: Oropharynx is clear and moist.  Eyes: Conjunctivae are normal.  Neck: Neck supple.  Cardiovascular: Normal rate, regular rhythm and normal heart sounds.  No murmur heard. Pulmonary/Chest: Effort normal and breath sounds normal. No  respiratory distress.  Abdominal: Soft. Normal appearance and bowel sounds are normal. She exhibits no distension and no ascites. There is no tenderness. There is no rigidity, no rebound, no guarding and no tenderness at McBurney's point.  Musculoskeletal: She exhibits no edema.  Neurological: She is alert.  Skin: Skin is warm and dry.  Psychiatric: She has a normal mood and affect.  Nursing note and vitals reviewed.    ED Treatments / Results  Labs (all labs ordered are listed, but only abnormal results are displayed) Labs Reviewed  COMPREHENSIVE METABOLIC PANEL - Abnormal; Notable for the following components:      Result Value   Chloride 99 (*)    All other components within normal limits  URINALYSIS, ROUTINE W REFLEX  MICROSCOPIC - Abnormal; Notable for the following components:   Specific Gravity, Urine <1.005 (*)    All other components within normal limits  LIPASE, BLOOD  CBC  PREGNANCY, URINE    EKG None  Radiology No results found.  Procedures Procedures (including critical care time)  Medications Ordered in ED Medications - No data to display   Initial Impression / Assessment and Plan / ED Course  I have reviewed the triage vital signs and the nursing notes.  Pertinent labs & imaging results that were available during my care of the patient were reviewed by me and considered in my medical decision making (see chart for details).    Discussed pt presentation and exam findings with Dr. Rogene Houston, who agrees with the plan to discharge pt with outpt f/u.   Final Clinical Impressions(s) / ED Diagnoses   Final diagnoses:  Abdominal pain, unspecified abdominal location   Patient is nontoxic, nonseptic appearing, in no apparent distress. she declined pain meds in ED. Pt felt that sxs may be due to gas pains as she has had similar pain in the past from this. This does seem reasonable as sxs resolved with gas x taken PTA. Able to tolerate po.  Labs, and vitals  reviewed. No leukocytosis. CMP and lipase reassuring. UA negative for UTI.  Patient does not meet the SIRS or Sepsis criteria.  On exam patient does not have a surgical abdomin and there are no peritoneal signs.  No indication of appendicitis, bowel obstruction, bowel perforation, cholecystitis, diverticulitis, or ectopic pregnancy.  Patient discharged home and given strict instructions for follow-up with their primary care physician.  I have also discussed reasons to return immediately to the ER.  Patient expresses understanding and agrees with plan.  ED Discharge Orders    None       Bishop Dublin 08/08/17 1609    Fredia Sorrow, MD 08/09/17 [phone]

## 2017-08-08 NOTE — Discharge Instructions (Signed)
Please follow up with your primary doctor within the next 5-7 days for re-evaluation.  Please return to the ER sooner if you have any new or worsening symptoms, or if you have any of the following symptoms:  Abdominal pain that does not go away.  You have a fever.  You keep throwing up (vomiting).  The pain is felt only in portions of the abdomen. Pain in the right side could possibly be appendicitis. In an adult, pain in the left lower portion of the abdomen could be colitis or diverticulitis.  You pass bloody or black tarry stools.  There is bright red blood in the stool.  The constipation stays for more than 4 days.  There is belly (abdominal) or rectal pain.  You do not seem to be getting better.  You have any questions or concerns.

## 2017-08-08 NOTE — ED Triage Notes (Signed)
Patient reports upper abdominal pain with sudden onset this morning.  Reports she took a gasex and the pain has subsided some.  Denies N/V/D.

## 2017-08-22 ENCOUNTER — Telehealth: Payer: Self-pay | Admitting: Family Medicine

## 2017-08-22 NOTE — Telephone Encounter (Signed)
Copied from CRM [phone]#[id number]. Topic: Quick Communication - See Telephone Encounter >> Aug 22, 2017  3:57 PM Arlyss Gandyichardson, Ola Raap N, NT wrote: CRM for notification. See Telephone encounter for: 08/22/17. Amy Mcveigh with [employer] case manager/health coach wanted to let the doctor know that she has reached out to the pt since her recent ER visit and states that pt has followed up with her surgeon but not Dr. Zola ButtonLowne-Chase. CB#: [phone]. She did state no reason to give a call back.

## 2017-08-23 ENCOUNTER — Other Ambulatory Visit: Payer: Self-pay | Admitting: General Surgery

## 2017-08-23 DIAGNOSIS — R1084 Generalized abdominal pain: Secondary | ICD-10-CM

## 2017-08-23 NOTE — Telephone Encounter (Signed)
Just FYI.

## 2017-08-25 ENCOUNTER — Ambulatory Visit
Admission: RE | Admit: 2017-08-25 | Discharge: 2017-08-25 | Disposition: A | Discharge status: Home / Self Care | Payer: BC Managed Care – PPO | Source: Ambulatory Visit | Attending: General Surgery | Admitting: General Surgery

## 2017-08-25 DIAGNOSIS — R1084 Generalized abdominal pain: Secondary | ICD-10-CM

## 2017-08-25 MED ORDER — IOPAMIDOL (ISOVUE-300) INJECTION 61%
100.0000 mL | Freq: Once | INTRAVENOUS | Status: AC | PRN
  Administered 2017-08-25: 100 mL via INTRAVENOUS

## 2017-09-07 ENCOUNTER — Encounter: Payer: Self-pay | Admitting: Family Medicine

## 2017-09-07 ENCOUNTER — Ambulatory Visit: Payer: BC Managed Care – PPO | Admitting: Family Medicine

## 2017-09-07 ENCOUNTER — Ambulatory Visit: Payer: BC Managed Care – PPO

## 2017-09-07 VITALS — BP 122/80 | HR 56 | Temp 98.1°F | Ht 64.0 in | Wt 152.4 lb

## 2017-09-07 DIAGNOSIS — J029 Acute pharyngitis, unspecified: Secondary | ICD-10-CM

## 2017-09-07 DIAGNOSIS — E538 Deficiency of other specified B group vitamins: Secondary | ICD-10-CM | POA: Diagnosis not present

## 2017-09-07 DIAGNOSIS — J358 Other chronic diseases of tonsils and adenoids: Secondary | ICD-10-CM | POA: Diagnosis not present

## 2017-09-07 LAB — POCT RAPID STREP A (OFFICE): RAPID STREP A SCREEN: NEGATIVE

## 2017-09-07 MED ORDER — LEVOCETIRIZINE DIHYDROCHLORIDE 5 MG PO TABS: 5.0000 mg | ORAL_TABLET | Freq: Every evening | ORAL | 0 refills | Status: DC

## 2017-09-07 MED ORDER — CYANOCOBALAMIN 1000 MCG/ML IJ SOLN
1000.0000 ug | Freq: Once | INTRAMUSCULAR | Status: AC
  Administered 2017-09-07: 1000 ug via INTRAMUSCULAR

## 2017-09-07 NOTE — Progress Notes (Signed)
Pre visit review using our clinic review tool, if applicable. No additional management support is needed unless otherwise documented below in the visit note. 

## 2017-09-07 NOTE — Progress Notes (Signed)
SUBJECTIVE:   [name] is a 42 y.o. female presents to the clinic for:  Chief Complaint  Patient presents with  . Sore Throat  . Injections    b12    Complains of sore throat for 1 day.  Other associated symptoms: sinus congestion, rhinorrhea and sore throat.  Denies: sinus pain, ear pain, ear drainage, wheezing, shortness of breath, myalgia, cough and fever Sick Contacts: none known Therapy to date: none  Social History   Tobacco Use  Smoking Status Never Smoker  Smokeless Tobacco Never Used    ROS: Pertinent items are noted in HPI  Patient's medications, allergies, past medical, surgical, social and family histories were reviewed and updated as appropriate.  OBJECTIVE:  BP 122/80 (BP Location: Left Arm, Patient Position: Sitting, Cuff Size: Normal)   Pulse (!) 56   Temp 98.1 F (36.7 C) (Oral)   Ht 5\' 4"  (1.626 m)   Wt 152 lb 6 oz (69.1 kg)   SpO2 97%   BMI 26.16 kg/m  General: Awake, alert, appearing stated age Eyes: conjunctivae and sclerae clear Ears: normal TMs bilaterally Nose: no visible exudate Oropharynx: White bodies appreciated on L tonsil, no erythema or asymmetry Neck: supple, no significant adenopathy Lungs: clear to auscultation, no wheezes, rales or rhonchi, symmetric air entry, normal effort Heart: Bradycardic, regular rhythm Skin:reveals no rash Psych: Age appropriate judgment and insight  ASSESSMENT/PLAN:  Sore throat - Plan: POCT rapid strep A  Vitamin B12 deficiency - Plan: cyanocobalamin ((VITAMIN B-12)) injection 1,000 mcg  Tonsillolith  Rapid strep is negative.  Given the fact that the white spots she saw are likely tonsilloliths, will implore supportive care.  I was able to remove a couple of them. Continue to practice good hand hygiene and push fluids. Ibuprofen and acetaminophen for pain. F/u prn. Pt voiced understanding and agreement to the plan.  Jilda Rocheicholas Paul EmmettWendling, DO 09/07/17 3:39 PM

## 2017-09-07 NOTE — Patient Instructions (Addendum)
Ibuprofen 400-600 mg (2-3 over the counter strength tabs) every 6 hours as needed for pain.  Salt water gargles, air humidifier, throat lozenges could be helpful.  Xyzal (levocetirizine) is available over the counter as an alternative to Zyrtec.  Let us know if you need anything.

## 2017-09-29 ENCOUNTER — Other Ambulatory Visit: Payer: Self-pay | Admitting: Family Medicine

## 2017-10-09 ENCOUNTER — Ambulatory Visit (INDEPENDENT_AMBULATORY_CARE_PROVIDER_SITE_OTHER): Payer: BC Managed Care – PPO | Admitting: *Deleted

## 2017-10-09 DIAGNOSIS — E538 Deficiency of other specified B group vitamins: Secondary | ICD-10-CM

## 2017-10-09 MED ORDER — CYANOCOBALAMIN 1000 MCG/ML IJ SOLN
1000.0000 ug | Freq: Once | INTRAMUSCULAR | Status: AC
  Administered 2017-10-09: 1000 ug via INTRAMUSCULAR

## 2017-10-09 NOTE — Progress Notes (Signed)
Reviewed  Lorin Hauck R Lowne Chase, DO  

## 2017-10-09 NOTE — Progress Notes (Signed)
Pt here for monthly B12 injection per Dr Zola Button.  B12 given IM, right deltoid and pt tolerated injection well.  Next B12 injection scheduled for 11/09/17 at 3:30pm.

## 2017-10-22 ENCOUNTER — Telehealth: Payer: Self-pay | Admitting: Family Medicine

## 2017-10-22 NOTE — Telephone Encounter (Signed)
Called pt and LVM informing that pcp would be out of the office on 10/26/17. Advised to call back and reschedule.

## 2017-10-26 ENCOUNTER — Encounter: Payer: BC Managed Care – PPO | Admitting: Family Medicine

## 2017-10-27 ENCOUNTER — Encounter: Payer: Self-pay | Admitting: Family Medicine

## 2017-10-29 NOTE — Telephone Encounter (Signed)
I have not seen anything from him yet We normally do labs same day as cpe

## 2017-11-01 ENCOUNTER — Encounter: Payer: Self-pay | Admitting: Family Medicine

## 2017-11-01 ENCOUNTER — Ambulatory Visit: Payer: BC Managed Care – PPO | Admitting: Family Medicine

## 2017-11-01 VITALS — BP 122/82 | HR 65 | Temp 98.5°F | Ht 64.0 in | Wt 142.4 lb

## 2017-11-01 DIAGNOSIS — K645 Perianal venous thrombosis: Secondary | ICD-10-CM

## 2017-11-01 NOTE — Progress Notes (Signed)
[facility] [address], [place], [place] [address] [phone]  Date:  11/01/2017   Name:  [name]   DOB:  08/31/1975   MRN:  [id number]  PCP:  [name], DO    Chief Complaint: Hemorrhoids (c/o painful hemorrhoid. )   History of Present Illness:  [name] is a 42 y.o. very pleasant female patient who presents with the following:  She has a history of hemorrhoids for years which she has generally managed at home. However she has a bad one right now, more bothersome than she had in the past. She noted the hemorrhoid yesterday but it got much worse after she went for her evening run.   Not bleeding  It is uncomfortable to walk, sit She is otherwise feeling well, no other concerns  No risk of current pregnancy    Patient Active Problem List   Diagnosis Date Noted  . 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  . Lumbar disc disease 08/28/2016  . 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 Maxillary sinusitis, acute 06/09/2015  . Left acute otitis media 06/09/2015  . Knee pain, left 03/22/2015  . Conjunctivitis 03/27/2014  . Ear pain 03/27/2014  . Dyspepsia and other specified disorders of function of stomach 06/02/2013  . Anxiety state, unspecified 06/02/2013  . Severe obesity (BMI >= 40) ([place]) 12/24/2012  . VITAMIN B12 DEFICIENCY 10/16/2008  . DEPRESSION 09/04/2006  . GERD 09/04/2006  . COLPOSCOPY, HX OF 09/04/2006    Past Medical History:  Diagnosis Date  . Anxiety   . Asthma   . Chronic kidney disease   . Depression   . Gallstones   . GERD (gastroesophageal reflux disease)   . RAD (reactive airway disease)     Past Surgical History:  Procedure Laterality Date  . CESAREAN SECTION    . CHOLECYSTECTOMY  2007  . 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   Procedure: LAPAROSCOPIC ROUX-EN-Y GASTRIC BYPASS WITH UPPER ENDOSCOPY;  Surgeon: Greer Pickerel, MD;  Location: [facility];  Service: General;  Laterality:  N/A;    Social History   Tobacco Use  . Smoking status: Never Smoker  . Smokeless tobacco: Never Used  Substance Use Topics  . Alcohol use: Yes    Comment: rare  . Drug use: No    Family History  Problem Relation Age of Onset  . Breast cancer Mother   . Hypertension Mother   . Skin cancer Mother   . Pancreatic cancer Maternal Grandfather   . Arthritis Paternal Grandmother   . BRCA 1/2 Paternal Grandmother   . Hypertension Father   . Skin cancer Father   . BRCA 1/2 Cousin   . Diabetes Maternal Grandmother     Allergies  Allergen Reactions  . Amoxicillin Hives    Has patient had a PCN reaction causing immediate rash, facial/tongue/throat swelling, SOB or lightheadedness with hypotension:No Has patient had a PCN reaction causing severe rash involving mucus membranes or skin necrosis:unsure--doesn't think so Has patient had a PCN reaction that required hospitalization:No Has patient had a PCN reaction occurring within the last 10 years:No If all of the above answers are "NO", then may proceed with Cephalosporin use.   . Labetalol Hcl Other (See Comments)    Asthma  . Sulfonamide Derivatives Hives    Kidney Disease    Medication list has been reviewed and updated.  Current Outpatient Medications on File Prior to Visit  Medication Sig Dispense Refill  .  albuterol (PROAIR HFA) 108 (90 BASE) MCG/ACT inhaler INHALE 2 PUFFS FOUR TIMES DAILY AS NEEDED (Patient taking differently: Inhale 1-2 puffs into the lungs every 6 (six) hours as needed for wheezing or shortness of breath. ) 8.5 g 1  . ALPRAZolam (XANAX) 0.5 MG tablet Take 0.5 mg by mouth 2 (two) times daily as needed for anxiety.     . cetirizine (ZYRTEC) 10 MG tablet Take 10 mg by mouth at bedtime.     Marland Kitchen DEXILANT 30 MG capsule Take 1 capsule (30 mg total) by mouth daily. 30 capsule 5  . fluticasone (FLONASE) 50 MCG/ACT nasal spray Place 2 sprays into both nostrils at bedtime. 16 g 5  . hydrocortisone cream 0.5 % Apply 1  application topically 2 (two) times daily as needed (for ezcema/itchy skin on elbow).    Marland Kitchen levocetirizine (XYZAL) 5 MG tablet TAKE 1 TABLET BY MOUTH EVERY DAY IN THE EVENING 90 tablet 0  . levonorgestrel (MIRENA) 20 MCG/24HR IUD 1 each by Intrauterine route once.    . tretinoin (RETIN-A) 0.05 % cream APPLY TO FACE ONCE DAILY  AT BEDTIME  3  . triamcinolone cream (KENALOG) 0.1 %     . Vilazodone HCl (VIIBRYD) 40 MG TABS Take 40 mg by mouth daily.     No current facility-administered medications on file prior to visit.     Review of Systems:  As per HPI- otherwise negative.  No abd pain No fever or chills   Physical Examination: Vitals:   11/01/17 1112  BP: 122/82  Pulse: 65  Temp: 98.5 F (36.9 C)  SpO2: 98%   Vitals:   11/01/17 1112  Weight: 142 lb 6.4 oz (64.6 kg)  Height: '5\' 4"'$  (1.626 m)   Body mass index is 24.44 kg/m. Ideal Body Weight: Weight in (lb) to have BMI = 25: 145.3  GEN: WDWN, NAD, Non-toxic, A & O x 3, looks well  HEENT: Atraumatic, Normocephalic. Neck supple. No masses, No LAD. Ears and Nose: No external deformity. CV: RRR, No M/G/R. No JVD. No thrill. No extra heart sounds. PULM: CTA B, no wheezes, crackles, rhonchi. No retractions. No resp. distress. No accessory muscle use. ABD: S, NT, ND, benign belly EXTR: No c/c/e NEURO Normal gait.  PSYCH: Normally interactive. Conversant. Not depressed or anxious appearing.  Calm demeanor.  Rectal: she has a thrombosed, tender hemorrhoid the size of a large blueberry at 3:00. Discussed with pt-she would like to I and D.  Prepped with betadine Anesthesia with 4m of 2% lidocaine. Opened hemorrhoid with 11 blade and expressed clot. Pt tolerated well., placed folded gauze in the gluteal cleft   Assessment and Plan: Thrombosed hemorrhoids  Treated as above, and went over home care for her.  She will alert me if any concerns or complications   Signed JLamar Blinks MD

## 2017-11-01 NOTE — Patient Instructions (Signed)
Good to see you today- sorry about your hemorrhoid! We got the blood clot out today Use sitz baths 3x a day for the next couple of days Use can use a folded gauze or maxi pad for any oozing. If any significant bleeding please let me know Contact me if any concerns!  JC

## 2017-11-02 ENCOUNTER — Encounter: Payer: Self-pay | Admitting: Family Medicine

## 2017-11-02 ENCOUNTER — Ambulatory Visit: Payer: Self-pay

## 2017-11-02 NOTE — Telephone Encounter (Signed)
I have advised pt over mychart

## 2017-11-02 NOTE — Telephone Encounter (Signed)
Returned call to pt.  Stated the pain, at excised thrombosed hemorrhoid site, is improved, but the swelling hasn't decreased.  Reported there is oozing, as was advised to expect.  Has taken 3 sitz baths.  Questioned if there is any topical treatment recommended?  Encouraged to continue the sitz baths.  Advised will send message to Dr. Zola ButtonLowne-Chase, to question if any topical cream is recommended.  Agreed with plan.  Stated she has an appt. with Dr. Zola ButtonLowne-Chase on Monday, 6/24.        Message from Oneal GroutJennifer S Sebastian sent at 11/02/2017 8:31 AM EDT   Summary: thrombosed hemorrhoid lanced yesterday, still large   Seen yesterday for thrombosed hemorrhoid, was lanced. Still large and very painful, not as bad. Oozing some. Would like to speak to nurse regarding it still being large.

## 2017-11-02 NOTE — Telephone Encounter (Signed)
She can us prep H or tucks  Sitz baths Does it feel like hemorrhoid is still there?

## 2017-11-05 ENCOUNTER — Encounter: Payer: Self-pay | Admitting: Family Medicine

## 2017-11-05 ENCOUNTER — Ambulatory Visit (INDEPENDENT_AMBULATORY_CARE_PROVIDER_SITE_OTHER): Payer: BC Managed Care – PPO | Admitting: Family Medicine

## 2017-11-05 VITALS — BP 131/71 | HR 60 | Temp 98.0°F | Resp 18 | Ht 64.0 in | Wt 148.2 lb

## 2017-11-05 DIAGNOSIS — M79605 Pain in left leg: Secondary | ICD-10-CM | POA: Diagnosis not present

## 2017-11-05 DIAGNOSIS — K649 Unspecified hemorrhoids: Secondary | ICD-10-CM

## 2017-11-05 DIAGNOSIS — Z0001 Encounter for general adult medical examination with abnormal findings: Secondary | ICD-10-CM | POA: Diagnosis not present

## 2017-11-05 DIAGNOSIS — M79644 Pain in right finger(s): Secondary | ICD-10-CM | POA: Diagnosis not present

## 2017-11-05 DIAGNOSIS — Z0000 Encounter for general adult medical examination without abnormal findings: Secondary | ICD-10-CM | POA: Insufficient documentation

## 2017-11-05 MED ORDER — HYDROCORTISONE 2.5 % RE CREA: 1.0000 "application " | TOPICAL_CREAM | Freq: Two times a day (BID) | RECTAL | 0 refills | Status: DC

## 2017-11-05 NOTE — Assessment & Plan Note (Signed)
Overuse from mouse Try massage

## 2017-11-05 NOTE — Progress Notes (Signed)
Subjective:  I acted as a Education administrator for [employer]. [name], [place]   Patient ID: [name], female    DOB: 20-Jan-1976, 42 y.o.   MRN: [id number]  Chief Complaint  Patient presents with  . Annual Exam    HPI  Patient is in today for annual exam.  Pt c/o tight muscles in her L thigh/ leg and pain in her R thumb/hand--she uses computer/ mouse daily.    Patient Care Team: Carollee Herter, Alferd Apa, DO as PCP - General Druscilla Brownie, MD as Consulting Physician (Dermatology) Artelia Laroche, CNM as Midwife (Obstetrics and Gynecology)   Past Medical History:  Diagnosis Date  . Anxiety   . Asthma   . Chronic kidney disease   . Depression   . Gallstones   . GERD (gastroesophageal reflux disease)   . RAD (reactive airway disease)     Past Surgical History:  Procedure Laterality Date  . CESAREAN SECTION    . CHOLECYSTECTOMY  2007  . 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 Breast cancer Mother   . Hypertension Mother   . Skin cancer Mother   . Pancreatic cancer Maternal Grandfather   . Arthritis Paternal Grandmother   . BRCA 1/2 Paternal Grandmother   . Hypertension Father   . Skin cancer Father   . BRCA 1/2 Cousin   . Diabetes Maternal Grandmother     Social History   Socioeconomic History  . Marital status: Married    Spouse name: Not on file  . Number of children: 2  . Years of education: Not on file  . Highest education level: Not on file  Occupational History  . Occupation: Games developer: [employer]  Social Needs  . Financial resource strain: Not on file  . Food insecurity:    Worry: Not on file    Inability: Not on file  . Transportation needs:    Medical: Not on file    Non-medical: Not on file  Tobacco Use  . Smoking status: Never Smoker    . Smokeless tobacco: Never Used  Substance and Sexual Activity  . Alcohol use: Yes    Comment: rare  . Drug use: No  . Sexual activity: Yes  Lifestyle  . Physical activity:    Days per week: Not on file    Minutes per session: Not on file  . Stress: Not on file  Relationships  . Social connections:    Talks on phone: Not on file    Gets together: Not on file    Attends religious service: Not on file    Active member of club or organization: Not on file    Attends meetings of clubs or organizations: Not on file    Relationship status: Not on file  . Intimate partner violence:    Fear of current or ex partner: Not on file    Emotionally abused: Not on file    Physically abused: Not on file    Forced sexual activity: Not on file  Other Topics Concern  . Not on file  Social History Narrative  . Not on file    Outpatient Medications Prior to Visit  Medication Sig Dispense Refill  . albuterol (PROAIR HFA) 108 (90 BASE) MCG/ACT inhaler INHALE 2 PUFFS FOUR TIMES DAILY  AS NEEDED (Patient taking differently: Inhale 1-2 puffs into the lungs every 6 (six) hours as needed for wheezing or shortness of breath. ) 8.5 g 1  . ALPRAZolam (XANAX) 0.5 MG tablet Take 0.5 mg by mouth 2 (two) times daily as needed for anxiety.     . cetirizine (ZYRTEC) 10 MG tablet Take 10 mg by mouth at bedtime.     Marland Kitchen DEXILANT 30 MG capsule Take 1 capsule (30 mg total) by mouth daily. 30 capsule 5  . fluticasone (FLONASE) 50 MCG/ACT nasal spray Place 2 sprays into both nostrils at bedtime. 16 g 5  . hydrocortisone cream 0.5 % Apply 1 application topically 2 (two) times daily as needed (for ezcema/itchy skin on elbow).    Marland Kitchen levocetirizine (XYZAL) 5 MG tablet TAKE 1 TABLET BY MOUTH EVERY DAY IN THE EVENING 90 tablet 0  . levonorgestrel (MIRENA) 20 MCG/24HR IUD 1 each by Intrauterine route once.    . tretinoin (RETIN-A) 0.05 % cream APPLY TO FACE ONCE DAILY  AT BEDTIME  3  . triamcinolone cream (KENALOG) 0.1 %      . Vilazodone HCl (VIIBRYD) 40 MG TABS Take 40 mg by mouth daily.     No facility-administered medications prior to visit.     Allergies  Allergen Reactions  . Amoxicillin Hives    Has patient had a PCN reaction causing immediate rash, facial/tongue/throat swelling, SOB or lightheadedness with hypotension:No Has patient had a PCN reaction causing severe rash involving mucus membranes or skin necrosis:unsure--doesn't think so Has patient had a PCN reaction that required hospitalization:No Has patient had a PCN reaction occurring within the last 10 years:No If all of the above answers are "NO", then may proceed with Cephalosporin use.   . Labetalol Hcl Other (See Comments)    Asthma  . Sulfonamide Derivatives Hives    Kidney Disease    Review of Systems  Constitutional: Negative for chills, fever and malaise/fatigue.  HENT: Negative for congestion and hearing loss.   Eyes: Negative for discharge.  Respiratory: Negative for cough, sputum production and shortness of breath.   Cardiovascular: Negative for chest pain, palpitations and leg swelling.  Gastrointestinal: Negative for abdominal pain, blood in stool, constipation, diarrhea, heartburn, nausea and vomiting.  Genitourinary: Negative for dysuria, frequency, hematuria and urgency.  Musculoskeletal: Negative for back pain, falls and myalgias.  Skin: Negative for rash.  Neurological: Negative for dizziness, sensory change, loss of consciousness, weakness and headaches.  Endo/Heme/Allergies: Negative for environmental allergies. Does not bruise/bleed easily.  Psychiatric/Behavioral: Negative for depression and suicidal ideas. The patient is not nervous/anxious and does not have insomnia.        Objective:    Physical Exam  Constitutional: She is oriented to person, place, and time. She appears well-developed and well-nourished. No distress.  HENT:  Head: Normocephalic and atraumatic.  Right Ear: External ear normal.  Left  Ear: External ear normal.  Nose: Nose normal.  Mouth/Throat: Oropharynx is clear and moist.  Eyes: Pupils are equal, round, and reactive to light. Conjunctivae and EOM are normal. Right eye exhibits no discharge. Left eye exhibits no discharge.  Neck: Normal range of motion. Neck supple. No JVD present. Carotid bruit is not present. No thyromegaly present.  Cardiovascular: Normal rate, regular rhythm, normal heart sounds and intact distal pulses.  No murmur heard. Pulmonary/Chest: Effort normal and breath sounds normal. No respiratory distress. She has no wheezes. She has no rales. She exhibits no tenderness.  Abdominal: Soft. Bowel sounds are normal. She  exhibits no distension and no mass. There is no tenderness. There is no rebound and no guarding.  Musculoskeletal: Normal range of motion. She exhibits tenderness. She exhibits no edema.       Left hip: She exhibits tenderness.       Arms:      Legs: Lymphadenopathy:    She has no cervical adenopathy.  Neurological: She is alert and oriented to person, place, and time. She has normal reflexes. No cranial nerve deficit.  Skin: Skin is warm and dry. No rash noted. She is not diaphoretic. No erythema.  Psychiatric: She has a normal mood and affect. Her behavior is normal. Judgment and thought content normal.  Nursing note and vitals reviewed.   BP 131/71 (BP Location: Left Arm, Patient Position: Sitting, Cuff Size: Normal)   Pulse 60   Temp 98 F (36.7 C) (Oral)   Resp 18   Ht _0  (1.626 m)   Wt 148 lb 3.2 oz (67.2 kg)   SpO2 100%   BMI 25.44 kg/m  Wt Readings from Last 3 Encounters:  11/05/17 148 lb 3.2 oz (67.2 kg)  11/01/17 142 lb 6.4 oz (64.6 kg)  09/07/17 152 lb 6 oz (69.1 kg)   BP Readings from Last 3 Encounters:  11/05/17 131/71  11/01/17 122/82  09/07/17 122/80     Immunization History  Administered Date(s) Administered  . Influenza,inj,Quad PF,6+ Mos 04/24/2013, 06/01/2014  . Influenza-Unspecified 02/21/2015,  01/24/2016  . Td 08/24/2004  . Tdap 10/11/2011    Health Maintenance  Topic Date Due  . HIV Screening  05/21/1988  . MAMMOGRAM  11/12/2016  . PAP SMEAR  11/12/2016  . INFLUENZA VACCINE  12/13/2017  . TETANUS/TDAP  10/10/2021    Lab Results  Component Value Date   WBC 5.8 08/08/2017   HGB 13.9 08/08/2017   HCT 39.7 08/08/2017   PLT 264 08/08/2017   GLUCOSE 90 08/08/2017   CHOL 121 09/14/2016   TRIG 74.0 09/14/2016   HDL 39.70 09/14/2016   LDLDIRECT 154.3 07/28/2010   LDLCALC 67 09/14/2016   ALT 33 08/08/2017   AST 31 08/08/2017   NA 137 08/08/2017   K 4.2 08/08/2017   CL 99 (L) 08/08/2017   CREATININE 0.77 08/08/2017   BUN 16 08/08/2017   CO2 27 08/08/2017   TSH 2.22 12/01/2016    Lab Results  Component Value Date   TSH 2.22 12/01/2016   Lab Results  Component Value Date   WBC 5.8 08/08/2017   HGB 13.9 08/08/2017   HCT 39.7 08/08/2017   MCV 92.1 08/08/2017   PLT 264 08/08/2017   Lab Results  Component Value Date   NA 137 08/08/2017   K 4.2 08/08/2017   CO2 27 08/08/2017   GLUCOSE 90 08/08/2017   BUN 16 08/08/2017   CREATININE 0.77 08/08/2017   BILITOT 0.6 08/08/2017   ALKPHOS 76 08/08/2017   AST 31 08/08/2017   ALT 33 08/08/2017   PROT 7.9 08/08/2017   ALBUMIN 4.8 08/08/2017   CALCIUM 9.5 08/08/2017   ANIONGAP 11 08/08/2017   GFR 69.83 09/14/2016   Lab Results  Component Value Date   CHOL 121 09/14/2016   Lab Results  Component Value Date   HDL 39.70 09/14/2016   Lab Results  Component Value Date   LDLCALC 67 09/14/2016   Lab Results  Component Value Date   TRIG 74.0 09/14/2016   Lab Results  Component Value Date   CHOLHDL 3 09/14/2016   No results found for:  HGBA1C       Assessment & Plan:   Problem List Items Addressed This Visit      Unprioritized   Left leg pain    ? Pulled muscle  Pt would like to try massage      Pain of right thumb    Overuse from mouse Try massage        Preventative health care - Primary     See avs ghm utd Check labs      Relevant Orders   CBC with Differential/Platelet   Comprehensive metabolic panel   Lipid panel   TSH    Other Visit Diagnoses    Hemorrhoids, unspecified hemorrhoid type       Relevant Medications   hydrocortisone (ANUSOL-HC) 2.5 % rectal cream      I am having Elnore Dione Plover start on hydrocortisone. I am also having her maintain her ALPRAZolam, cetirizine, albuterol, levonorgestrel, Vilazodone HCl, tretinoin, hydrocortisone cream, triamcinolone cream, fluticasone, DEXILANT, and levocetirizine.  Meds ordered this encounter  Medications  . hydrocortisone (ANUSOL-HC) 2.5 % rectal cream    Sig: Place 1 application rectally 2 (two) times daily.    Dispense:  30 g    Refill:  0    CMA served as scribe during this visit. History, Physical and Plan performed by medical provider. Documentation and orders reviewed and attested to.  [name], DO

## 2017-11-05 NOTE — Patient Instructions (Signed)
Preventive Care 18-39 Years, Female Preventive care refers to lifestyle choices and visits with your health care provider that can promote health and wellness. What does preventive care include?  A yearly physical exam. This is also called an annual well check.  Dental exams once or twice a year.  Routine eye exams. Ask your health care provider how often you should have your eyes checked.  Personal lifestyle choices, including: ? Daily care of your teeth and gums. ? Regular physical activity. ? Eating a healthy diet. ? Avoiding tobacco and drug use. ? Limiting alcohol use. ? Practicing safe sex. ? Taking vitamin and mineral supplements as recommended by your health care provider. What happens during an annual well check? The services and screenings done by your health care provider during your annual well check will depend on your age, overall health, lifestyle risk factors, and family history of disease. Counseling Your health care provider may ask you questions about your:  Alcohol use.  Tobacco use.  Drug use.  Emotional well-being.  Home and relationship well-being.  Sexual activity.  Eating habits.  Work and work Statistician.  Method of birth control.  Menstrual cycle.  Pregnancy history.  Screening You may have the following tests or measurements:  Height, weight, and BMI.  Diabetes screening. This is done by checking your blood sugar (glucose) after you have not eaten for a while (fasting).  Blood pressure.  Lipid and cholesterol levels. These may be checked every 5 years starting at age 66.  Skin check.  Hepatitis C blood test.  Hepatitis B blood test.  Sexually transmitted disease (STD) testing.  BRCA-related cancer screening. This may be done if you have a family history of breast, ovarian, tubal, or peritoneal cancers.  Pelvic exam and Pap test. This may be done every 3 years starting at age 40. Starting at age 59, this may be done every 5  years if you have a Pap test in combination with an HPV test.  Discuss your test results, treatment options, and if necessary, the need for more tests with your health care provider. Vaccines Your health care provider may recommend certain vaccines, such as:  Influenza vaccine. This is recommended every year.  Tetanus, diphtheria, and acellular pertussis (Tdap, Td) vaccine. You may need a Td booster every 10 years.  Varicella vaccine. You may need this if you have not been vaccinated.  HPV vaccine. If you are 69 or younger, you may need three doses over 6 months.  Measles, mumps, and rubella (MMR) vaccine. You may need at least one dose of MMR. You may also need a second dose.  Pneumococcal 13-valent conjugate (PCV13) vaccine. You may need this if you have certain conditions and were not previously vaccinated.  Pneumococcal polysaccharide (PPSV23) vaccine. You may need one or two doses if you smoke cigarettes or if you have certain conditions.  Meningococcal vaccine. One dose is recommended if you are age 27-21 years and a first-year college student living in a residence hall, or if you have one of several medical conditions. You may also need additional booster doses.  Hepatitis A vaccine. You may need this if you have certain conditions or if you travel or work in places where you may be exposed to hepatitis A.  Hepatitis B vaccine. You may need this if you have certain conditions or if you travel or work in places where you may be exposed to hepatitis B.  Haemophilus influenzae type b (Hib) vaccine. You may need this if  you have certain risk factors.  Talk to your health care provider about which screenings and vaccines you need and how often you need them. This information is not intended to replace advice given to you by your health care provider. Make sure you discuss any questions you have with your health care provider. Document Released: 06/27/2001 Document Revised: 01/19/2016  Document Reviewed: 03/02/2015 Elsevier Interactive Patient Education  [employer].

## 2017-11-05 NOTE — Assessment & Plan Note (Signed)
See avs ghm utd  Check labs 

## 2017-11-05 NOTE — Assessment & Plan Note (Signed)
?   Pulled muscle  Pt would like to try massage

## 2017-11-06 LAB — COMPREHENSIVE METABOLIC PANEL
ALT: 25 U/L (ref 0–35)
AST: 23 U/L (ref 0–37)
Albumin: 4.3 g/dL (ref 3.5–5.2)
Alkaline Phosphatase: 60 U/L (ref 39–117)
BUN: 16 mg/dL (ref 6–23)
CALCIUM: 9.2 mg/dL (ref 8.4–10.5)
CO2: 30 meq/L (ref 19–32)
CREATININE: 0.77 mg/dL (ref 0.40–1.20)
Chloride: 98 mEq/L (ref 96–112)
GFR: 86.37 mL/min (ref >60.00)
GLUCOSE: 86 mg/dL (ref 70–99)
Potassium: 3.8 mEq/L (ref 3.5–5.1)
Sodium: 136 mEq/L (ref 135–145)
TOTAL PROTEIN: 6.5 g/dL (ref 6.0–8.3)
Total Bilirubin: 0.3 mg/dL (ref 0.2–1.2)

## 2017-11-06 LAB — CBC WITH DIFFERENTIAL/PLATELET
BASOS ABS: 0 10*3/uL (ref 0.0–0.1)
Basophils Relative: 0.7 % (ref 0.0–3.0)
EOS ABS: 0.1 10*3/uL (ref 0.0–0.7)
Eosinophils Relative: 2.3 % (ref 0.0–5.0)
HEMATOCRIT: 35.5 % — AB (ref 36.0–46.0)
Hemoglobin: 12.4 g/dL (ref 12.0–15.0)
LYMPHS PCT: 35 % (ref 12.0–46.0)
Lymphs Abs: 2.1 10*3/uL (ref 0.7–4.0)
MCHC: 34.8 g/dL (ref 30.0–36.0)
MCV: 93.8 fl (ref 78.0–100.0)
Monocytes Absolute: 0.4 10*3/uL (ref 0.1–1.0)
Monocytes Relative: 6.9 % (ref 3.0–12.0)
NEUTROS PCT: 55.1 % (ref 43.0–77.0)
Neutro Abs: 3.4 10*3/uL (ref 1.4–7.7)
PLATELETS: 272 10*3/uL (ref 150.0–400.0)
RBC: 3.79 Mil/uL — AB (ref 3.87–5.11)
RDW: 12.5 % (ref 11.5–15.5)
WBC: 6.1 10*3/uL (ref 4.0–10.5)

## 2017-11-06 LAB — LIPID PANEL
CHOL/HDL RATIO: 2
Cholesterol: 155 mg/dL (ref 0–200)
HDL: 68.5 mg/dL (ref >39.00)
LDL Cholesterol: 72 mg/dL (ref 0–99)
NONHDL: 86.81
Triglycerides: 75 mg/dL (ref 0.0–149.0)
VLDL: 15 mg/dL (ref 0.0–40.0)

## 2017-11-06 LAB — TSH: TSH: 2.07 u[IU]/mL (ref 0.35–4.50)

## 2017-11-09 ENCOUNTER — Ambulatory Visit (INDEPENDENT_AMBULATORY_CARE_PROVIDER_SITE_OTHER): Payer: BC Managed Care – PPO

## 2017-11-09 DIAGNOSIS — E538 Deficiency of other specified B group vitamins: Secondary | ICD-10-CM

## 2017-11-09 MED ORDER — CYANOCOBALAMIN 1000 MCG/ML IJ SOLN
1000.0000 ug | Freq: Once | INTRAMUSCULAR | Status: AC
  Administered 2017-11-09: 1000 ug via INTRAMUSCULAR

## 2017-11-09 NOTE — Progress Notes (Signed)
Pre visit review using our clinic review tool, if applicable. No additional management support is needed unless otherwise documented below in the visit note.  Pt here for monthly B12 injection per Dr. Laury AxonLowne  B12 1000mcg given IM, left deltoid and pt tolerated injection well.  Next B12 injection scheduled for 12/07/17 at 10:00

## 2017-11-24 ENCOUNTER — Other Ambulatory Visit: Payer: Self-pay | Admitting: Family Medicine

## 2017-11-24 DIAGNOSIS — J302 Other seasonal allergic rhinitis: Secondary | ICD-10-CM

## 2017-11-27 ENCOUNTER — Encounter: Payer: Self-pay | Admitting: Skilled Nursing Facility1

## 2017-11-27 ENCOUNTER — Encounter: Payer: BC Managed Care – PPO | Attending: General Surgery | Admitting: Skilled Nursing Facility1

## 2017-11-27 DIAGNOSIS — Z9884 Bariatric surgery status: Secondary | ICD-10-CM | POA: Diagnosis not present

## 2017-11-27 DIAGNOSIS — Z713 Dietary counseling and surveillance: Secondary | ICD-10-CM | POA: Diagnosis present

## 2017-11-27 NOTE — Progress Notes (Signed)
RYGB Surgery  Medical Nutrition Therapy:  Appt start time: 9:06 end time: 9:40  Primary concerns today: Post-operative Bariatric Surgery Nutrition Management.  Pt states her energy level is decent and a lot better than it was. Pt states she will not eat any seafood. Pts B12 has not been checked since getting the B12 shots. Pt states she wants to be 135 pounds: Dietitian advised she has excess skin.   Surgery date: 08/28/2016 Surgery type: [facility]GB [facility] Weight today: 153.8  141.8 Weight change: 12  [name]  BODY COMP RESULTS  10/25/2016 12/25/2016 05/30/2016 11/27/2017   BMI (kg/m^2) 38.1 34.1 28.1 24.3   Fat Mass (lbs) 101 83 46 41   Fat Free Mass (lbs) 107.2 103.6 107.8 100.8   Total Body Water (lbs) 77.6 74.2 75.6 70.2    24-hr recall: pt states she is between 1300-1500 calories a day B (AM): think thin protein oatmeal with banana Snk (10:30AM): cottage cheese L (PM): protein shake and necterine  Snk (PM): string cheese D (PM):turkey and cheese sandwich on low carb bread or grilled chicken or Malaw[place]urkey burger or uncrustable and greek yogurt Snk (PM): cottage cheese  Fluid intake: water 67.6, powerade 32 zero, propel 16.9, unsweet tea 33.8:  fluid ounces, nuun: total 75-100 Estimated total protein intake: 96  Medications: See List Supplementation: Opurity capsule and 3 calcium's and D3  Using straws: no Drinking while eating: no Having you been chewing well:no Chewing/swallowing difficulties: no Changes in vision: no Changes to mood/headaches: no Hair loss/Changes to skin/Changes to nails: some shedding to hair Any difficulty focusing or concentrating: no Sweating: no Dizziness/Lightheaded: no Palpitations: no  Carbonated beverages: no N/V/D/C/GAS: no Abdominal Pain: no Dumping syndrome: no  Recent physical activity: running 4 days a week 3-4 miles and 3 times a week resistance (fleet feet)  Progress Towards Goal(s):  In  progress.   Nutritional Diagnosis:  -3.3 Overweight/obesity related to past poor dietary habits and physical inactivity as evidenced by patient w/ recent RYGB surgery following dietary guidelines for continued weight loss.    Intervention:  Nutrition counseling.Dietitian educated the pt on advancing her diet to include non-starchy vegetables. Goals: -Eat all of your protein first then start in on your vegetables -Listen to your body and eat slowly, chew until applesauce consistency  -Eat all foods: fuel your workouts  -Have balanced meals throughout the day -Take 1 iron supplement a day at least 2 hours from your calcium -Eat throughout your long runs -Eat more throughout the day -Try chocolate milk after you have run -Work on your fear of eating food and gaining weight  Teaching Method Utilized:  Visual Auditory Hands on  Barriers to learning/adherence to lifestyle change: food fear (NOT resulting in disordered eating)  Demonstrated degree of understanding via:  Teach Back   Monitoring/Evaluation:  Dietary intake, exercise, and body weight.

## 2017-12-07 ENCOUNTER — Ambulatory Visit (INDEPENDENT_AMBULATORY_CARE_PROVIDER_SITE_OTHER): Payer: BC Managed Care – PPO

## 2017-12-07 DIAGNOSIS — E538 Deficiency of other specified B group vitamins: Secondary | ICD-10-CM

## 2017-12-07 MED ORDER — CYANOCOBALAMIN 1000 MCG/ML IJ SOLN
1000.0000 ug | Freq: Once | INTRAMUSCULAR | Status: AC
  Administered 2017-12-07: 1000 ug via INTRAMUSCULAR

## 2017-12-07 NOTE — Progress Notes (Signed)
Pre visit review using our clinic tool,if applicable. No additional management support is needed unless otherwise documented below in the visit note.   Pt here for monthly B12 injection per order from Dr. Zola ButtonLowne-Chase due to patient  having B12 deficiency.  B12 1000  mcg given IM left deltoid and pt tolerated injection well. No complaints voiced this visit. Patient would like to know when her next B12 lab will be.  Next B12 injection scheduled for January 04, 2018 @ 3:00 pm.

## 2017-12-13 ENCOUNTER — Telehealth: Payer: Self-pay | Admitting: Skilled Nursing Facility1

## 2017-12-13 NOTE — Telephone Encounter (Signed)
Pt called asking for the dietitian to put in an order for labs to be taken.  Dietitian advised pt she did not have the authority to do that so the pt stated she would ask her physician.

## 2017-12-19 ENCOUNTER — Other Ambulatory Visit: Payer: Self-pay | Admitting: Family Medicine

## 2017-12-23 ENCOUNTER — Encounter: Payer: Self-pay | Admitting: Family Medicine

## 2017-12-25 NOTE — Telephone Encounter (Signed)
Please print this for me. Thank you.

## 2017-12-28 ENCOUNTER — Other Ambulatory Visit: Payer: Self-pay | Admitting: *Deleted

## 2017-12-28 ENCOUNTER — Other Ambulatory Visit: Payer: Self-pay | Admitting: Family Medicine

## 2017-12-28 MED ORDER — NONFORMULARY OR COMPOUNDED ITEM: 3 refills | Status: DC

## 2017-12-28 MED ORDER — NONFORMULARY OR COMPOUNDED ITEM: 3 refills | Status: AC

## 2017-12-28 NOTE — Telephone Encounter (Signed)
I printed it

## 2017-12-29 ENCOUNTER — Other Ambulatory Visit: Payer: Self-pay | Admitting: Family Medicine

## 2018-01-04 ENCOUNTER — Ambulatory Visit (INDEPENDENT_AMBULATORY_CARE_PROVIDER_SITE_OTHER): Payer: BC Managed Care – PPO

## 2018-01-04 DIAGNOSIS — E538 Deficiency of other specified B group vitamins: Secondary | ICD-10-CM

## 2018-01-04 MED ORDER — CYANOCOBALAMIN 1000 MCG/ML IJ SOLN
1000.0000 ug | Freq: Once | INTRAMUSCULAR | Status: AC
  Administered 2018-01-04: 1000 ug via INTRAMUSCULAR

## 2018-01-04 NOTE — Progress Notes (Signed)
Pre visit review using our clinic tool,if applicable. No additional management support is needed unless otherwise documented below in the visit note.   Pt here for monthly B12 injection per   B12 1000 mcg given IM right deltod, and pt tolerated injection well.  No complaints voiced this visit.  Next B12 injection scheduled for 1 month.

## 2018-02-05 ENCOUNTER — Ambulatory Visit: Payer: BC Managed Care – PPO

## 2018-02-05 ENCOUNTER — Ambulatory Visit (INDEPENDENT_AMBULATORY_CARE_PROVIDER_SITE_OTHER): Payer: BC Managed Care – PPO

## 2018-02-05 DIAGNOSIS — E538 Deficiency of other specified B group vitamins: Secondary | ICD-10-CM | POA: Diagnosis not present

## 2018-02-05 MED ORDER — CYANOCOBALAMIN 1000 MCG/ML IJ SOLN
1000.0000 ug | Freq: Once | INTRAMUSCULAR | Status: AC
  Administered 2018-02-05: 1000 ug via INTRAMUSCULAR

## 2018-02-05 NOTE — Progress Notes (Signed)
Noted  Yvonne R Lowne Chase, DO  

## 2018-03-08 ENCOUNTER — Ambulatory Visit: Payer: BC Managed Care – PPO

## 2018-03-14 ENCOUNTER — Ambulatory Visit: Payer: BC Managed Care – PPO

## 2018-03-21 ENCOUNTER — Ambulatory Visit: Payer: BC Managed Care – PPO | Admitting: Internal Medicine

## 2018-03-21 ENCOUNTER — Telehealth: Payer: Self-pay | Admitting: *Deleted

## 2018-03-21 NOTE — Telephone Encounter (Signed)
Patient called [facility] and was extremely upset about a "scheduling problem" I was told and that she wanted to speak with the Engineer, manufacturing. The [facility] transferred the patient over to me. I asked Ms. Goodnow if she could talk with me about what type of "scheduling problem" she was having. Patient informed me that when she first called [03/19/18] to make appointment with Dr. Patsy Lager for "thrombosed hemorrhoid" because she had seen her before for this issue and she did corrective procedure in office, that she was told that she was scheduled with Dr. Patsy Lager at [facility] on 03/20/18]; it was incorrectly scheduled with Dr. Okey Dupre at [facility]. Patient then states that she was told that she could not get in to see Dr. Patsy Lager until Monday, 03/25/18 at 3:00pm. Pt was very emotional about the inconvenience this had caused her physically & mentally, and had some harsh words for those involved in this matter, due to the pain it had caused her [especially physically; she feels that it was our error and someone should have been able to fit her in with Dr. Patsy Lager for the 'procedure' due the extent of her pain she was having; but she stated that the person she spoke with just kept asking her, "does it really hurt that bad, or is it really that painful?"]. Patient's elderly parents, at her request, have also established care at our office and she is rethinking that commitment as well, at the thought of them being put through the extent of her suffering. Patient has already had to go to Urgent Care to have this medical issue addressed and stated how embarrassed it made her feel. I have apologized and told her that we understand completely how she is feeling, and that I would send a detailed message to our practice manager and have her return her call as requested; pt thanked me for taking the time and listening to what she was feeling/SLS 11/07

## 2018-03-25 ENCOUNTER — Ambulatory Visit: Payer: BC Managed Care – PPO | Admitting: Family Medicine

## 2018-03-25 ENCOUNTER — Encounter

## 2018-03-31 ENCOUNTER — Encounter: Payer: Self-pay | Admitting: Family Medicine

## 2018-03-31 ENCOUNTER — Other Ambulatory Visit: Payer: Self-pay | Admitting: Family Medicine

## 2018-03-31 DIAGNOSIS — K649 Unspecified hemorrhoids: Secondary | ICD-10-CM

## 2018-04-01 ENCOUNTER — Other Ambulatory Visit: Payer: Self-pay | Admitting: Family Medicine

## 2018-04-01 DIAGNOSIS — K649 Unspecified hemorrhoids: Secondary | ICD-10-CM

## 2018-04-01 MED ORDER — HYDROCORTISONE 2.5 % RE CREA: 1.0000 "application " | TOPICAL_CREAM | Freq: Two times a day (BID) | RECTAL | 0 refills | Status: DC

## 2018-04-22 ENCOUNTER — Encounter: Payer: Self-pay | Admitting: Family Medicine

## 2018-04-22 ENCOUNTER — Ambulatory Visit: Payer: BC Managed Care – PPO | Admitting: Family Medicine

## 2018-04-22 VITALS — BP 108/78 | HR 85 | Temp 98.7°F | Ht 64.0 in

## 2018-04-22 DIAGNOSIS — J0100 Acute maxillary sinusitis, unspecified: Secondary | ICD-10-CM

## 2018-04-22 MED ORDER — METHYLPREDNISOLONE ACETATE 80 MG/ML IJ SUSP
80.0000 mg | Freq: Once | INTRAMUSCULAR | Status: AC
  Administered 2018-04-22: 80 mg via INTRAMUSCULAR

## 2018-04-22 MED ORDER — DOXYCYCLINE HYCLATE 100 MG PO TABS: 100.0000 mg | ORAL_TABLET | Freq: Two times a day (BID) | ORAL | 0 refills | Status: AC

## 2018-04-22 NOTE — Progress Notes (Signed)
Pre visit review using our clinic review tool, if applicable. No additional management support is needed unless otherwise documented below in the visit note. 

## 2018-04-22 NOTE — Patient Instructions (Addendum)
Most sinus infections are viral in etiology and antibiotics will not be helpful. That being said, if you start having worsening symptoms over 3 days, you are worsening by day 10 or not improving by day 14, go ahead and take it. You are on Day 11 as of now.   Continue to push fluids, practice good hand hygiene, and cover your mouth if you cough.  If you start having fevers, shaking or shortness of breath, seek immediate care.  Let us know if you need anything.

## 2018-04-22 NOTE — Addendum Note (Signed)
Addended by: Scharlene GlossEWING, Inez Rosato B on: 04/22/2018 11:24 AM   Modules accepted: Orders

## 2018-04-22 NOTE — Progress Notes (Signed)
Chief Complaint  Patient presents with  . Cough    nasal congestion    [name] here for URI complaints.  Duration: 11 days  Associated symptoms: sinus headache, sinus congestion, sinus pain, rhinorrhea, ear fullness and some cough Denies: itchy watery eyes, ear pain, ear drainage, sore throat, wheezing, shortness of breath, myalgia and fevers Treatment to date: Sudafed, PO anthistamine, INCS, Tylenol Sick contacts: Yes  ROS:  Const: Denies fevers HEENT: As noted in HPI Lungs: No SOB  Past Medical History:  Diagnosis Date  . Anxiety   . Asthma   . Chronic kidney disease   . Depression   . Gallstones   . GERD (gastroesophageal reflux disease)   . RAD (reactive airway disease)     BP 108/78 (BP Location: Left Arm, Patient Position: Sitting, Cuff Size: Normal)   Pulse 85   Temp 98.7 F (37.1 C) (Oral)   Ht 5\' 4"  (1.626 m)   SpO2 99%   BMI 24.34 kg/m  General: Awake, alert, appears stated age HEENT: AT, [place], ears patent b/l and TM's neg, nares patent w/o discharge, max sinsuses ttp b/l, pharynx pink and without exudates, MMM Neck: No masses or asymmetry Heart: RRR Lungs: CTAB, no accessory muscle use Psych: Age appropriate judgment and insight, normal mood and affect  Acute maxillary sinusitis, recurrence not specified - Plan: doxycycline (VIBRA-TABS) 100 MG tablet  Orders as above. Pocket rx. Steroid inj for symptom control sent in. Continue to push fluids, practice good hand hygiene, cover mouth when coughing. F/u prn. If starting to experience fevers, shaking, or shortness of breath, seek immediate care. Pt voiced understanding and agreement to the plan.  Sabrina Rocheicholas Paul BlanchesterWendling, DO 04/22/18 11:18 AM

## 2018-05-09 ENCOUNTER — Encounter: Payer: Self-pay | Admitting: Family Medicine

## 2018-05-09 NOTE — Telephone Encounter (Signed)
Please advise in absence of pcp 

## 2018-05-29 ENCOUNTER — Other Ambulatory Visit: Payer: Self-pay | Admitting: Family Medicine

## 2018-05-29 DIAGNOSIS — J302 Other seasonal allergic rhinitis: Secondary | ICD-10-CM

## 2018-05-29 MED ORDER — FLUTICASONE PROPIONATE 50 MCG/ACT NA SUSP: 2.0000 | Freq: Every day | NASAL | 1 refills | Status: DC

## 2018-05-29 NOTE — Telephone Encounter (Signed)
Requested Prescriptions  Pending Prescriptions Disp Refills  . fluticasone (FLONASE) 50 MCG/ACT nasal spray 16 g 1    Sig: Place 2 sprays into both nostrils at bedtime.     Ear, Nose, and Throat: Nasal Preparations - Corticosteroids Passed - 05/29/2018 10:00 AM      Passed - Valid encounter within last 12 months    Recent Outpatient Visits          1 month ago Acute maxillary sinusitis, recurrence not specified   [facility] at [facility], [place], [place]   6 months ago Preventative health care   [facility] at [facility] [place], [place] R, DO   6 months ago Thrombosed hemorrhoids   Holiday representative at [facility], [place] Found, [place]   8 months ago Sore throat   Holiday representative at [facility] [place], [place], [place]   11 months ago Fever, unspecified fever cause   Holiday representative at [facility] [place], [place], [place]

## 2018-05-29 NOTE — Telephone Encounter (Signed)
Copied from CRM [id number]. Topic: Quick Communication - Rx Refill/Question >> May 29, 2018  9:46 AM Lorrine Kin, NT wrote: **Pharmacy calling and states that this request was sent on 05/25/2018 and 05/28/2018 with no response**  Medication: fluticasone (FLONASE) 50 MCG/ACT nasal spray  Has the patient contacted their pharmacy? Yes.   (Agent: If no, request that the patient contact the pharmacy for the refill.) (Agent: If yes, when and what did the pharmacy advise?)  Preferred Pharmacy (with phone number or street name): [id number] - [place], [place] - [address]  Agent: Please be advised that RX refills may take up to 3 business days. We ask that you follow-up with your pharmacy.

## 2018-06-07 ENCOUNTER — Other Ambulatory Visit: Payer: Self-pay | Admitting: Family Medicine

## 2018-06-07 DIAGNOSIS — J302 Other seasonal allergic rhinitis: Secondary | ICD-10-CM

## 2018-06-12 ENCOUNTER — Other Ambulatory Visit: Payer: Self-pay | Admitting: Family Medicine

## 2018-06-24 ENCOUNTER — Other Ambulatory Visit: Payer: Self-pay | Admitting: *Deleted

## 2018-06-24 DIAGNOSIS — J302 Other seasonal allergic rhinitis: Secondary | ICD-10-CM

## 2018-06-24 MED ORDER — FLUTICASONE PROPIONATE 50 MCG/ACT NA SUSP: 2.0000 | Freq: Every day | NASAL | 0 refills | Status: AC

## 2018-10-08 ENCOUNTER — Ambulatory Visit (INDEPENDENT_AMBULATORY_CARE_PROVIDER_SITE_OTHER): Payer: BC Managed Care – PPO | Admitting: Family Medicine

## 2018-10-08 ENCOUNTER — Other Ambulatory Visit: Payer: Self-pay

## 2018-10-08 ENCOUNTER — Encounter: Payer: Self-pay | Admitting: Family Medicine

## 2018-10-08 DIAGNOSIS — R109 Unspecified abdominal pain: Secondary | ICD-10-CM | POA: Diagnosis not present

## 2018-10-08 NOTE — Assessment & Plan Note (Signed)
?  muscle strain Hold off on working out for now Moist heat We will bring the pt in the office if symptoms do not improve or worsen

## 2018-10-08 NOTE — Progress Notes (Signed)
Virtual Visit via Video Note  I connected with [name] on 10/08/18 at  1:15 PM EDT by a video enabled telemedicine application and verified that I am speaking with the correct person using two identifiers.  Location: Patient: home Provider: office   I discussed the limitations of evaluation and management by telemedicine and the availability of in person appointments. The patient expressed understanding and agreed to proceed.  History of Present Illness: Pt is home c/o R side/ flank pain.  No dysuria, no NVD,  No fevers She has been working out differently It hurts to move Pt has no GB  Pain is not affected by eating.     Observations/Objective: No vitals obtained Pt in NAD Pain in mid R abd and low R side-- no rebound   Assessment and Plan: 1. Abdominal pain, unspecified abdominal location ? Muscle strain Hold off on working out for now [employer] if symptoms do not improve or worsen and we will bring her in for evaluation   Follow Up Instructions:    I discussed the assessment and treatment plan with the patient. The patient was provided an opportunity to ask questions and all were answered. The patient agreed with the plan and demonstrated an understanding of the instructions.   The patient was advised to call back or seek an in-person evaluation if the symptoms worsen or if the condition fails to improve as anticipated.  I provided 20 minutes of non-face-to-face time during this encounter.   Donato Schultz, DO

## 2018-10-15 ENCOUNTER — Telehealth: Payer: Self-pay | Admitting: Family Medicine

## 2018-10-15 NOTE — Telephone Encounter (Signed)
LVM for pt to call office and ok to schedule in person visit on 10-18-2018 in the afternoon since provider will see acute visit on this day.

## 2018-10-18 ENCOUNTER — Encounter: Payer: Self-pay | Admitting: Family Medicine

## 2018-10-18 ENCOUNTER — Ambulatory Visit: Payer: BC Managed Care – PPO | Admitting: Family Medicine

## 2018-10-18 ENCOUNTER — Other Ambulatory Visit: Payer: Self-pay

## 2018-10-18 VITALS — BP 117/69 | HR 73 | Temp 99.0°F | Resp 12 | Ht 64.0 in | Wt 160.2 lb

## 2018-10-18 DIAGNOSIS — R1011 Right upper quadrant pain: Secondary | ICD-10-CM

## 2018-10-18 NOTE — Patient Instructions (Signed)
Abdominal Pain, Adult  Abdominal pain can be caused by many things. Often, abdominal pain is not serious and it gets better with no treatment or by being treated at home. However, sometimes abdominal pain is serious. Your health care provider will do a medical history and a physical exam to try to determine the cause of your abdominal pain.  Follow these instructions at home:   Take over-the-counter and prescription medicines only as told by your health care provider. Do not take a laxative unless told by your health care provider.   Drink enough fluid to keep your urine clear or pale yellow.   Watch your condition for any changes.   Keep all follow-up visits as told by your health care provider. This is important.  Contact a health care provider if:   Your abdominal pain changes or gets worse.   You are not hungry or you lose weight without trying.   You are constipated or have diarrhea for more than 2-3 days.   You have pain when you urinate or have a bowel movement.   Your abdominal pain wakes you up at night.   Your pain gets worse with meals, after eating, or with certain foods.   You are throwing up and cannot keep anything down.   You have a fever.  Get help right away if:   Your pain does not go away as soon as your health care provider told you to expect.   You cannot stop throwing up.   Your pain is only in areas of the abdomen, such as the right side or the left lower portion of the abdomen.   You have bloody or black stools, or stools that look like tar.   You have severe pain, cramping, or bloating in your abdomen.   You have signs of dehydration, such as:  ? Dark urine, very little urine, or no urine.  ? Cracked lips.  ? Dry mouth.  ? Sunken eyes.  ? Sleepiness.  ? Weakness.  This information is not intended to replace advice given to you by your health care provider. Make sure you discuss any questions you have with your health care provider.  Document Released: 02/08/2005 Document  Revised: 11/19/2015 Document Reviewed: 10/13/2015  Elsevier Interactive Patient Education  2019 Elsevier Inc.

## 2018-10-18 NOTE — Progress Notes (Signed)
Patient ID: [name], female    DOB: 1976/03/23  Age: 43 y.o. MRN: [id number]    Subjective:  Subjective  HPI [name] presents for f/u abd pain RUQ and mid epigastric.   Pt has a hx of GB surgery   Food does not seem to effect it she does not think.  She also has pain RLQ  No NVD  No fever   Review of Systems  Constitutional: Negative for appetite change, diaphoresis, fatigue and unexpected weight change.  Eyes: Negative for pain, redness and visual disturbance.  Respiratory: Negative for cough, chest tightness, shortness of breath and wheezing.   Cardiovascular: Negative for chest pain, palpitations and leg swelling.  Gastrointestinal: Positive for abdominal pain. Negative for constipation, diarrhea, nausea, rectal pain and vomiting.  Endocrine: Negative for cold intolerance, heat intolerance, polydipsia, polyphagia and polyuria.  Genitourinary: Negative for difficulty urinating, dysuria and frequency.  Neurological: Negative for dizziness, light-headedness, numbness and headaches.    History Past Medical History:  Diagnosis Date  . Anxiety   . Asthma   . Chronic kidney disease   . Depression   . Gallstones   . GERD (gastroesophageal reflux disease)   . 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   Her family history includes Arthritis in her paternal grandmother; BRCA 1/2 in her cousin and paternal grandmother; Breast cancer in her mother; Diabetes in her maternal grandmother; Hypertension in her father and mother; Pancreatic cancer in her maternal grandfather; Skin cancer in her father and mother.She reports that she has never smoked. She has never used smokeless tobacco. She reports current alcohol use. She reports that she does not use drugs.  Current Outpatient Medications on File Prior to Visit  Medication Sig Dispense Refill  . albuterol  (PROAIR HFA) 108 (90 BASE) MCG/ACT inhaler INHALE 2 PUFFS FOUR TIMES DAILY AS NEEDED (Patient taking differently: Inhale 1-2 puffs into the lungs every 6 (six) hours as needed for wheezing or shortness of breath. ) 8.5 g 1  . ALPRAZolam (XANAX) 0.5 MG tablet Take 0.5 mg by mouth 2 (two) times daily as needed for anxiety.     . DEXILANT 30 MG capsule TAKE 1 CAPSULE BY MOUTH EVERY DAY 90 capsule 1  . fluticasone (FLONASE) 50 MCG/ACT nasal spray Place 2 sprays into both nostrils at bedtime. 48 g 0  . hydrocortisone cream 0.5 % Apply 1 application topically 2 (two) times daily as needed (for ezcema/itchy skin on elbow).    Marland Kitchen levocetirizine (XYZAL) 5 MG tablet TAKE 1 TABLET BY MOUTH EVERY DAY IN THE EVENING 90 tablet 1  . levonorgestrel (MIRENA) 20 MCG/24HR IUD 1 each by Intrauterine route once.    . NONFORMULARY OR COMPOUNDED ITEM Bariatric multivitamin 1 po qd 90 each 3  . Probiotic Product (PROBIOTIC PO) Take by mouth.    . tretinoin (RETIN-A) 0.05 % cream APPLY TO FACE ONCE DAILY  AT BEDTIME  3  . triamcinolone cream (KENALOG) 0.1 %     . Vilazodone HCl (VIIBRYD) 40 MG TABS Take 40 mg by mouth daily.     No current facility-administered medications on file prior to visit.      Objective:  Objective  Physical Exam Vitals signs and nursing note reviewed.  Constitutional:      Appearance: She is well-developed.  HENT:     Head: Normocephalic and atraumatic.  Eyes:  Conjunctiva/sclera: Conjunctivae normal.  Neck:     Musculoskeletal: Normal range of motion and neck supple.     Thyroid: No thyromegaly.     Vascular: No carotid bruit or JVD.  Cardiovascular:     Rate and Rhythm: Normal rate and regular rhythm.     Heart sounds: Normal heart sounds. No murmur.  Pulmonary:     Effort: Pulmonary effort is normal. No respiratory distress.     Breath sounds: Normal breath sounds. No wheezing or rales.  Chest:     Chest wall: No tenderness.  Abdominal:     Tenderness: There is abdominal  tenderness in the right upper quadrant and right lower quadrant. There is no right CVA tenderness, left CVA tenderness, guarding or rebound.  Neurological:     Mental Status: She is alert and oriented to person, place, and time.    BP 117/69 (BP Location: Left Arm, Cuff Size: Normal)   Pulse 73   Temp 99 F (37.2 C) (Oral)   Resp 12   Ht '5\' 4"'$  (1.626 m)   Wt 160 lb 3.2 oz (72.7 kg)   LMP 10/17/2018   SpO2 100%   BMI 27.50 kg/m  Wt Readings from Last 3 Encounters:  10/18/18 160 lb 3.2 oz (72.7 kg)  11/27/17 141 lb 12.8 oz (64.3 kg)  11/05/17 148 lb 3.2 oz (67.2 kg)     Lab Results  Component Value Date   WBC 6.1 11/05/2017   HGB 12.4 11/05/2017   HCT 35.5 (L) 11/05/2017   PLT 272.0 11/05/2017   GLUCOSE 86 11/05/2017   CHOL 155 11/05/2017   TRIG 75.0 11/05/2017   HDL 68.50 11/05/2017   LDLDIRECT 154.3 07/28/2010   LDLCALC 72 11/05/2017   ALT 25 11/05/2017   AST 23 11/05/2017   NA 136 11/05/2017   K 3.8 11/05/2017   CL 98 11/05/2017   CREATININE 0.77 11/05/2017   BUN 16 11/05/2017   CO2 30 11/05/2017   TSH 2.07 11/05/2017    Ct Abdomen Pelvis W Contrast  Result Date: 08/25/2017 CLINICAL DATA:  43 year old female with a history of mid abdominal pain a few weeks ago. History of gastric bypass, cholecystectomy and C-section EXAM: CT ABDOMEN AND PELVIS WITH CONTRAST TECHNIQUE: Multidetector CT imaging of the abdomen and pelvis was performed using the standard protocol following bolus administration of intravenous contrast. CONTRAST:  149m ISOVUE-300 IOPAMIDOL (ISOVUE-300) INJECTION 61% COMPARISON:  Upper GI 07/12/2016 FINDINGS: Lower chest: No acute finding. Small Bochdalek's type hernia of the left posterior diaphragm with herniation of small volume of fat without inflammatory changes. Hepatobiliary: Unremarkable appearance of liver. Surgical changes of cholecystectomy. Pancreas: Unremarkable pancreas Spleen: Unremarkable spleen Adrenals/Urinary Tract: Unremarkable  appearance of the adrenal glands. No evidence of hydronephrosis of the right or left kidney. No nephrolithiasis. Unremarkable course of the right ureter. 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 Unremarkable appearance of the urinary bladder. Stomach/Bowel: Surgical changes of gastric bypass. Unremarkable appearance of the surgical anastomosis at the stomach and small bowel. No evidence of obstruction. No transition point. No abnormally distended small bowel or colon. Moderate stool burden. Vascular/Lymphatic: Small lymph nodes within the mesenteric fat. None of these are enlarged. Small lymph nodes in the periaortic/para-aortic nodal stations. Reproductive: Intrauterine device in place with otherwise unremarkable uterus and adnexa. Other: Small fat containing umbilical hernia. Musculoskeletal: No acute displaced fracture. No bony canal narrowing. No significant spinal degenerative changes.  IMPRESSION: No acute CT finding. Surgical changes of gastric bypass, with no complicating features. Cholecystectomy. 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 The site of union of the ureters is not identified on this CT. Intrauterine device. Electronically Signed   By: Corrie Mckusick D.O.   On: 08/25/2017 09:17     Assessment & Plan:  Plan  I am having Rockne Coons maintain her ALPRAZolam, albuterol, levonorgestrel, Vilazodone HCl, tretinoin, hydrocortisone cream, triamcinolone cream, NONFORMULARY OR COMPOUNDED ITEM, levocetirizine, Dexilant, fluticasone, and Probiotic Product (PROBIOTIC PO).  No orders of the defined types were placed in this encounter.   Problem List Items Addressed This Visit      Unprioritized   Abdominal pain - Primary   Relevant Orders   CT Abdomen Pelvis W Contrast   CBC with Differential/Platelet   Comprehensive metabolic panel    check labs and CT If pain worsens  -- go to ER  Follow-up: No follow-ups on file.  [name], DO

## 2018-10-19 LAB — COMPREHENSIVE METABOLIC PANEL
AG Ratio: 2 (calc) (ref 1.0–2.5)
ALT: 40 U/L — ABNORMAL HIGH (ref 6–29)
AST: 45 U/L — ABNORMAL HIGH (ref 10–35)
Albumin: 4.2 g/dL (ref 3.6–5.1)
Alkaline phosphatase (APISO): 49 U/L (ref 31–125)
BUN: 22 mg/dL (ref 7–25)
CO2: 27 mmol/L (ref 20–32)
Calcium: 9.1 mg/dL (ref 8.6–10.2)
Chloride: 104 mmol/L (ref 98–110)
Creat: 0.86 mg/dL (ref 0.50–1.10)
Globulin: 2.1 g/dL (calc) (ref 1.9–3.7)
Glucose, Bld: 76 mg/dL (ref 65–99)
Potassium: 4.7 mmol/L (ref 3.5–5.3)
Sodium: 140 mmol/L (ref 135–146)
Total Bilirubin: 0.3 mg/dL (ref 0.2–1.2)
Total Protein: 6.3 g/dL (ref 6.1–8.1)

## 2018-10-19 LAB — CBC WITH DIFFERENTIAL/PLATELET
Absolute Monocytes: 561 cells/uL (ref 200–950)
Basophils Absolute: 43 cells/uL (ref 0–200)
Basophils Relative: 0.6 %
Eosinophils Absolute: 128 cells/uL (ref 15–500)
Eosinophils Relative: 1.8 %
HCT: 37.8 % (ref 35.0–45.0)
Hemoglobin: 12.9 g/dL (ref 11.7–15.5)
Lymphs Abs: 1789 cells/uL (ref 850–3900)
MCH: 33.1 pg — ABNORMAL HIGH (ref 27.0–33.0)
MCHC: 34.1 g/dL (ref 32.0–36.0)
MCV: 96.9 fL (ref 80.0–100.0)
MPV: 11 fL (ref 7.5–12.5)
Monocytes Relative: 7.9 %
Neutro Abs: 4580 cells/uL (ref 1500–7800)
Neutrophils Relative %: 64.5 %
Platelets: 264 10*3/uL (ref 140–400)
RBC: 3.9 10*6/uL (ref 3.80–5.10)
RDW: 11.7 % (ref 11.0–15.0)
Total Lymphocyte: 25.2 %
WBC: 7.1 10*3/uL (ref 3.8–10.8)

## 2018-10-22 ENCOUNTER — Encounter: Payer: Self-pay | Admitting: Family Medicine

## 2018-10-22 NOTE — Telephone Encounter (Signed)
Pt asking about her ct

## 2018-10-28 ENCOUNTER — Encounter: Payer: Self-pay | Admitting: Family Medicine

## 2018-11-26 ENCOUNTER — Other Ambulatory Visit: Payer: Self-pay

## 2018-11-26 ENCOUNTER — Encounter: Payer: Self-pay | Admitting: Family Medicine

## 2018-11-26 MED ORDER — DEXILANT 30 MG PO CPDR: 30.0000 mg | DELAYED_RELEASE_CAPSULE | Freq: Every day | ORAL | 1 refills | Status: DC

## 2019-01-15 ENCOUNTER — Other Ambulatory Visit: Payer: Self-pay | Admitting: Certified Nurse Midwife

## 2019-01-15 DIAGNOSIS — R928 Other abnormal and inconclusive findings on diagnostic imaging of breast: Secondary | ICD-10-CM

## 2019-01-22 ENCOUNTER — Other Ambulatory Visit: Payer: Self-pay

## 2019-01-22 ENCOUNTER — Ambulatory Visit
Admission: RE | Admit: 2019-01-22 | Discharge: 2019-01-22 | Disposition: A | Discharge status: Home / Self Care | Payer: BC Managed Care – PPO | Source: Ambulatory Visit | Attending: Certified Nurse Midwife | Admitting: Certified Nurse Midwife

## 2019-01-22 DIAGNOSIS — R928 Other abnormal and inconclusive findings on diagnostic imaging of breast: Secondary | ICD-10-CM

## 2019-01-30 ENCOUNTER — Encounter: Payer: Self-pay | Admitting: Family Medicine

## 2019-01-30 ENCOUNTER — Telehealth: Payer: Self-pay | Admitting: Dietician

## 2019-01-30 NOTE — Telephone Encounter (Signed)
Patient called with questions regarding collagen protein supplements. I answered her questions and provided educational materials via email.    [name]) Maybell Misenheimer, MS, RD, LDN

## 2019-02-05 ENCOUNTER — Ambulatory Visit (INDEPENDENT_AMBULATORY_CARE_PROVIDER_SITE_OTHER): Payer: BC Managed Care – PPO

## 2019-02-05 ENCOUNTER — Other Ambulatory Visit: Payer: Self-pay

## 2019-02-05 DIAGNOSIS — Z23 Encounter for immunization: Secondary | ICD-10-CM

## 2019-02-12 ENCOUNTER — Encounter: Payer: Self-pay | Admitting: Family Medicine

## 2019-02-17 ENCOUNTER — Other Ambulatory Visit: Payer: Self-pay | Admitting: Registered"

## 2019-02-17 DIAGNOSIS — Z20822 Contact with and (suspected) exposure to covid-19: Secondary | ICD-10-CM

## 2019-02-19 LAB — NOVEL CORONAVIRUS, NAA: SARS-CoV-2, NAA: NOT DETECTED

## 2019-03-02 ENCOUNTER — Other Ambulatory Visit: Payer: Self-pay | Admitting: Family Medicine

## 2019-03-23 ENCOUNTER — Encounter: Payer: Self-pay | Admitting: Family Medicine

## 2019-03-24 ENCOUNTER — Other Ambulatory Visit: Payer: Self-pay | Admitting: Family Medicine

## 2019-03-24 DIAGNOSIS — K219 Gastro-esophageal reflux disease without esophagitis: Secondary | ICD-10-CM

## 2019-03-24 MED ORDER — OMEPRAZOLE 40 MG PO CPDR: 40.0000 mg | DELAYED_RELEASE_CAPSULE | Freq: Every day | ORAL | 3 refills | Status: DC

## 2019-03-24 NOTE — Telephone Encounter (Signed)
I have sent omeprazole to the pharmacy

## 2019-04-08 ENCOUNTER — Other Ambulatory Visit: Payer: Self-pay

## 2019-04-08 ENCOUNTER — Ambulatory Visit (INDEPENDENT_AMBULATORY_CARE_PROVIDER_SITE_OTHER): Payer: BC Managed Care – PPO | Admitting: Sports Medicine

## 2019-04-08 VITALS — BP 120/78 | Ht 64.0 in | Wt 145.0 lb

## 2019-04-08 DIAGNOSIS — M76811 Anterior tibial syndrome, right leg: Secondary | ICD-10-CM | POA: Diagnosis not present

## 2019-04-08 NOTE — Progress Notes (Signed)
   Subjective:    Patient ID: [name], female    DOB: 1975/06/08, 43 y.o.   MRN: [id number]  HPI chief complaint: Left foot and right shoulder pain  Very pleasant 43 year old female comes in today with a couple of different complaints.  Main complaint is left foot pain is been present for a couple of weeks.  She describes pain across the anterior aspect of her forefoot and ankle.  She has had a sizable bone spur over the dorsum of her foot for quite some time but her current pain began after she did an aggressive bike ride a couple of weeks ago.  She has not noticed any swelling.  She has tried some KT taping over the past day or so with some benefit.  She denies similar problems in the past.  She has continued to ride and run despite her pain.  She is also complaining of 6 months of diffuse right shoulder pain.  No injury that she can recall.  Pain is achy in quality.  Pain will radiate down the right arm at times into the radial aspect of the forearm and hand.  She denies numbness or tingling.  No weakness.  Symptoms seem to be worse with running.  She is right-hand dominant.  Past medical history reviewed Medications reviewed Allergies reviewed    Review of Systems As above    Objective:   Physical Exam  Well-developed, well-nourished.  No acute distress.  Awake alert and oriented x3.  Vital signs reviewed.  Right shoulder: Full painless shoulder range of motion.  No tenderness to palpation.  Rotator cuff strength is 5/5.  Negative empty can, negative Hawkins. Neurological exam: Strength is 5/5 both upper extremities.  Reflexes are equal at the biceps, triceps, and brachioradialis tendons.  No atrophy.  Sensation is intact to light touch grossly.  Left foot: There is a palpable spur in the dorsal midfoot area.  There is tenderness to palpation along the course of the anterior tibialis tendon.  No soft tissue swelling.  Strength is 5/5 but does reproduce pain with resisted  dorsiflexion of the ankle.  Neutral arches.  Good pulses.  Walking without a limp.      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  She will try topical Voltaren twice daily and will continue with either KT taping or compression when running.  Of also recommended post exercise ice for 10 minutes.  Symptoms should improve rather quickly and if not she will return to the office for reevaluation and consideration of a bedside ultrasound.  For her right arm and shoulder pain she is going to take a watchful waiting approach.  She has no weakness on exam and her symptoms are tolerable currently.  I did mention the possibility of physical therapy if symptoms worsen.  I would also consider cervical spine x-rays. Follow-up as needed.

## 2019-04-08 NOTE — Patient Instructions (Signed)
Great to meet you today!  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  Cut your volume down by 50% and then increase 10% per week if feeling better. Use compression on your foot and ankle when exercising.  Be sure to ice for 10 minutes after exercise or walking the dogs. By some voltaren gel and apply it twice daily until pain free.

## 2019-04-15 ENCOUNTER — Ambulatory Visit: Payer: BC Managed Care – PPO | Admitting: Sports Medicine

## 2019-04-16 ENCOUNTER — Encounter: Payer: Self-pay | Admitting: Family Medicine

## 2019-04-17 NOTE — Telephone Encounter (Signed)
We can do it

## 2019-04-22 ENCOUNTER — Encounter: Payer: Self-pay | Admitting: Family Medicine

## 2019-04-22 ENCOUNTER — Ambulatory Visit (INDEPENDENT_AMBULATORY_CARE_PROVIDER_SITE_OTHER): Payer: BC Managed Care – PPO | Admitting: Family Medicine

## 2019-04-22 ENCOUNTER — Other Ambulatory Visit: Payer: Self-pay

## 2019-04-22 DIAGNOSIS — M62838 Other muscle spasm: Secondary | ICD-10-CM | POA: Insufficient documentation

## 2019-04-22 NOTE — Assessment & Plan Note (Signed)
Massage has helped with no meds needed Form filled out so she can use her FSA

## 2019-04-22 NOTE — Progress Notes (Signed)
Virtual Visit via Video Note  I connected with [name] on 04/22/19 at 10:20 AM EST by a video enabled telemedicine application and verified that I am speaking with the correct person using two identifiers.  Location: Patient: home  Provider: office    I discussed the limitations of evaluation and management by telemedicine and the availability of in person appointments. The patient expressed understanding and agreed to proceed.  History of Present Illness: Pt is home and is really just asking for rx for massage for neck, shoulder and foot pain--- it has really helped and she would like to con't and she can use her FSA for this    No other complaints    Observations/Objective: No vitals obtained  Pt is in NAD   Assessment and Plan: 1. Muscle spasm of shoulder region Massage has helped with no meds needed Form filled out so she can use her FSA   Follow Up Instructions:    I discussed the assessment and treatment plan with the patient. The patient was provided an opportunity to ask questions and all were answered. The patient agreed with the plan and demonstrated an understanding of the instructions.   The patient was advised to call back or seek an in-person evaluation if the symptoms worsen or if the condition fails to improve as anticipated.  I provided 15 minutes of non-face-to-face time during this encounter.   [name], DO

## 2019-05-04 ENCOUNTER — Other Ambulatory Visit: Payer: Self-pay | Admitting: Family Medicine

## 2019-06-17 ENCOUNTER — Other Ambulatory Visit: Payer: Self-pay | Admitting: Family Medicine

## 2019-06-17 DIAGNOSIS — K219 Gastro-esophageal reflux disease without esophagitis: Secondary | ICD-10-CM

## 2019-07-24 ENCOUNTER — Ambulatory Visit: Payer: BC Managed Care – PPO | Attending: Internal Medicine

## 2019-07-24 DIAGNOSIS — Z23 Encounter for immunization: Secondary | ICD-10-CM

## 2019-07-24 NOTE — Progress Notes (Signed)
   Covid-19 Vaccination Clinic  Name:  [name]    MRN: [id number] DOB: 1976/03/05  07/24/2019  [name] was observed post Covid-19 immunization for 15 minutes without incident. She was provided with Vaccine Information Sheet and instruction to access the V-Safe system.   [name] was instructed to call 911 with any severe reactions post vaccine: Marland Kitchen Difficulty breathing  . Swelling of face and throat  . A fast heartbeat  . A bad rash all over body  . Dizziness and weakness   Immunizations Administered    Name Date Dose VIS Date Route   Pfizer COVID-19 Vaccine 07/24/2019 12:51 PM 0.3 mL 04/25/2019 Intramuscular   Manufacturer: [employer], [employer]   Lot: [id number]   NDC: [address]-7

## 2019-07-25 ENCOUNTER — Ambulatory Visit: Payer: BC Managed Care – PPO | Admitting: Family Medicine

## 2019-07-25 ENCOUNTER — Encounter: Payer: Self-pay | Admitting: Family Medicine

## 2019-07-25 ENCOUNTER — Other Ambulatory Visit: Payer: Self-pay

## 2019-07-25 ENCOUNTER — Ambulatory Visit: Payer: Self-pay

## 2019-07-25 VITALS — BP 120/76 | HR 64 | Ht 64.0 in | Wt 150.0 lb

## 2019-07-25 DIAGNOSIS — M79671 Pain in right foot: Secondary | ICD-10-CM

## 2019-07-25 NOTE — Patient Instructions (Signed)
Nice to meet you Please try the stretches  Please try ice as needed Please try the straps   Please send me a message in MyChart with any questions or updates.  Please see me back in 4 weeks.   --Dr. Jordan Likes

## 2019-07-25 NOTE — Progress Notes (Signed)
[name] - 44 y.o. female MRN [id number]  Date of birth: 03/14/1976  SUBJECTIVE:  Including CC & ROS.  Chief Complaint  Patient presents with  . Foot Pain    right foot x 5 days    [name] is a 44 y.o. female that is presenting with right foot pain.  He feels the pain in the mid arch and has some pain laterally as well.  Denies any specific inciting event.  She felt the pain towards the end of the run.  She was running roughly 3 miles.  This was the first time she has gone in 6 to 8 weeks.  Normally she runs about 3 days/week.  No history of similar pain or surgery.   Review of Systems See HPI   HISTORY: Past Medical, Surgical, Social, and Family History Reviewed & Updated per EMR.   Pertinent Historical Findings include:  Past Medical History:  Diagnosis Date  . Anxiety   . Asthma   . Chronic kidney disease   . Depression   . Gallstones   . GERD (gastroesophageal reflux disease)   . RAD (reactive airway disease)     Past Surgical History:  Procedure Laterality Date  . CESAREAN SECTION    . CHOLECYSTECTOMY  2007  . 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 Breast cancer Mother   . Hypertension Mother   . Skin cancer Mother   . Pancreatic cancer Maternal Grandfather   . Arthritis Paternal Grandmother   . BRCA 1/2 Paternal Grandmother   . Hypertension Father   . Skin cancer Father   . BRCA 1/2 Cousin   . Diabetes Maternal Grandmother     Social History   Socioeconomic History  . Marital status: Married    Spouse name: Not on file  . Number of children: 2  . Years of education: Not on file  . Highest education level: Not on file  Occupational History  . Occupation: Games developer: [employer]  Tobacco Use  . Smoking status: Never  Smoker  . Smokeless tobacco: Never Used  Substance and Sexual Activity  . Alcohol use: Yes    Comment: rare  . Drug use: No  . Sexual activity: Yes  Other Topics Concern  . Not on file  Social History Narrative  . Not on file   Social Determinants of Health   Financial Resource Strain:   . Difficulty of Paying Living Expenses:   Food Insecurity:   . Worried About Charity fundraiser in the Last Year:   . Arboriculturist in the Last Year:   Transportation Needs:   . Film/video editor (Medical):   Marland Kitchen Lack of Transportation (Non-Medical):   Physical Activity:   . Days of Exercise per Week:   . Minutes of Exercise per Session:   Stress:   . Feeling of Stress :   Social Connections:   . Frequency of Communication with Friends and Family:   . Frequency of Social Gatherings with Friends and Family:   . Attends Religious Services:   . Active Member of Clubs or Organizations:   . Attends Archivist Meetings:   Marland Kitchen Marital Status:   Intimate Partner Violence:   . Fear of Current or Ex-Partner:   .  Emotionally Abused:   Marland Kitchen Physically Abused:   . Sexually Abused:      PHYSICAL EXAM:  VS: BP 120/76   Pulse 64   Ht '5\' 4"'$  (1.626 m)   Wt 150 lb (68 kg)   BMI 25.75 kg/m  Physical Exam Gen: NAD, alert, cooperative with exam, well-appearing MSK:  Right foot: Tarsometatarsal bossing. No obvious swelling or ecchymosis. Limited dorsiflexion. No tenderness to palpation at the origin of the plantar fascia. Normal rise up on tiptoes. Normal strength resistance. Neurovascularly intact  Limited ultrasound: Right foot:  Normal appearing plantar fascia. Normal-appearing posterior tibialis at the medial malleolus and insertion into the navicular. No increased hyperemia of the sesamoids. No effusion within the ankle joint.   Summary: Normal structural exam  Ultrasound and interpretation by Clearance Coots, MD    ASSESSMENT & PLAN:   Right foot pain Does not  appear to be specifically plantar fasciitis.  Also having some lateral foot pain as well.  May be more of a dynamic process as she has some limited dorsiflexion. -Midfoot arch strap. -Counseled on home exercise therapy and supportive care. -If no improvement can consider placing metatarsal pads or physical therapy.  Will consider imaging.

## 2019-07-25 NOTE — Assessment & Plan Note (Signed)
Does not appear to be specifically plantar fasciitis.  Also having some lateral foot pain as well.  May be more of a dynamic process as she has some limited dorsiflexion. -Midfoot arch strap. -Counseled on home exercise therapy and supportive care. -If no improvement can consider placing metatarsal pads or physical therapy.  Will consider imaging.

## 2019-08-18 ENCOUNTER — Ambulatory Visit: Payer: BC Managed Care – PPO | Attending: Internal Medicine

## 2019-08-18 DIAGNOSIS — Z23 Encounter for immunization: Secondary | ICD-10-CM

## 2019-08-18 NOTE — Progress Notes (Signed)
   Covid-19 Vaccination Clinic  Name:  AVRIANA JOO    MRN: [id number] DOB: 1975-08-11  08/18/2019  Ms. Mol was observed post Covid-19 immunization for 15 minutes without incident. She was provided with Vaccine Information Sheet and instruction to access the V-Safe system.   Ms. Soloway was instructed to call 911 with any severe reactions post vaccine: Marland Kitchen Difficulty breathing  . Swelling of face and throat  . A fast heartbeat  . A bad rash all over body  . Dizziness and weakness   Immunizations Administered    Name Date Dose VIS Date Route   Pfizer COVID-19 Vaccine 08/18/2019 10:18 AM 0.3 mL 04/25/2019 Intramuscular   Manufacturer: [employer], [employer]   Lot: [id number]   NDC: [address]-1

## 2019-09-11 ENCOUNTER — Other Ambulatory Visit: Payer: Self-pay | Admitting: Family Medicine

## 2019-09-11 DIAGNOSIS — K219 Gastro-esophageal reflux disease without esophagitis: Secondary | ICD-10-CM

## 2019-09-19 ENCOUNTER — Ambulatory Visit: Payer: BC Managed Care – PPO | Admitting: Family Medicine

## 2019-09-19 ENCOUNTER — Other Ambulatory Visit: Payer: Self-pay

## 2019-09-19 ENCOUNTER — Encounter: Payer: Self-pay | Admitting: Family Medicine

## 2019-09-19 VITALS — BP 122/86 | HR 75 | Temp 95.9°F | Ht 63.0 in | Wt 150.0 lb

## 2019-09-19 DIAGNOSIS — M7918 Myalgia, other site: Secondary | ICD-10-CM | POA: Diagnosis not present

## 2019-09-19 MED ORDER — PREDNISONE 20 MG PO TABS: 40.0000 mg | ORAL_TABLET | Freq: Every day | ORAL | 0 refills | Status: AC

## 2019-09-19 MED ORDER — CYCLOBENZAPRINE HCL 10 MG PO TABS: 5.0000 mg | ORAL_TABLET | Freq: Three times a day (TID) | ORAL | 0 refills | Status: DC | PRN

## 2019-09-19 MED ORDER — TRAMADOL HCL 50 MG PO TABS: 50.0000 mg | ORAL_TABLET | Freq: Three times a day (TID) | ORAL | 0 refills | Status: AC | PRN

## 2019-09-19 NOTE — Patient Instructions (Addendum)
Heat (pad or rice pillow in microwave) over affected area, 10-15 minutes twice daily.   Ice/cold pack over area for 10-15 min twice daily.  OK to take Tylenol 1000 mg (2 extra strength tabs) or 975 mg (3 regular strength tabs) every 6 hours as needed.  Do not drink alcohol, do any illicit/street drugs, drive or do anything that requires alertness while on this medicine.   Gluteus Syndrome Rehab It is normal to feel mild stretching, pulling, tightness, or discomfort as you do these exercises, but you should stop right away if you feel sudden pain or your pain gets worse.   Stretching and range of motion exercise This exercise warms up your muscles and joints and improves the movement and flexibility of your hip and pelvis. This exercise also helps to relieve pain and stiffness. Exercise A: Lunge (hip flexor stretch)     1. Kneel on the floor on your left / right knee. Bend your other knee so it is directly over your ankle. 2. Keep good posture with your head over your shoulders. Tuck your tailbone underneath you. This will prevent your back from arching too much. 3. You should feel a gentle stretch in the front of your thigh or hip. If you do not feel a stretch, slowly lunge forward with your chest up. 4. Hold this position for 30 seconds. 5. Slowly return to the starting position. Repeat 2 times. Complete this exercise 3 times per week. Strengthening exercises These exercises build strength and endurance in your hip and pelvis. Endurance is the ability to use your muscles for a long time, even after they get tired. Exercise B: Bridge (hip extensors)    1. Lie on your back on a firm surface with your knees bent and your feet flat on the floor. 2. Tighten your buttocks muscles and lift your bottom off the floor until the trunk of your body is level with your thighs. ? You should feel the muscles working in your buttocks and the back of your thighs. If this exercise is too easy, cross  your arms over your chest or lift one leg while your bottom is up off the floor. ? Do not arch your back. 3. Hold this position for 3 seconds. 4. Slowly lower your hips to the starting position. 5. Let your muscles relax completely between repetitions. Repeat 2 times. Complete this exercise 3 times per week. Exercise C: Straight leg raises (hip abductors)     1. Lie on your side with your left / right leg in the top position. Lie so your head, shoulder, knee, and hip line up. Bend your bottom knee to help you balance. 2. Lift your top leg up 4-6 inches (10-15 cm), keeping your toes pointed straight ahead. 3. Hold this position for 2 seconds. 4. Slowly lower your leg to the starting position and let your muscles relax completely. Repeat for a total of 10 repetitions. Repeat 2 times. Complete this exercise 3 times per week. Exercise D: Hip abductors and external rotators, quadruped 1. Get on your hands and knees on a firm, lightly padded surface. Your hands should be directly below your shoulders, and your knees should be directly below your hips. 2. Lift your left / right knee out to the side. Keep your knee bent. Do not twist your body. 3. Hold this position for 3 seconds. 4. Slowly lower your leg. Repeat for a total of 10 repetitions.  Repeat 2 times. Complete this exercise 3 times per week. Exercise  E: Single leg stand 1. Stand near a counter or door frame to hold onto as needed. It is helpful to look in a mirror for this exercise so you can watch your hip. 2. Squeeze your left / right buttock muscles then lift up your other foot. Do not let your left / righthip push out to the side. 3. Hold this position for 3 seconds. Repeat for a total of 10 repetitions. Repeat 2 times. Complete this exercise 3 times per week. Make sure you discuss any questions you have with your health care provider. Document Released: 05/01/2005 Document Revised: 01/06/2016 Document Reviewed: 04/13/2015 Elsevier  Interactive Patient Education  [employer].

## 2019-09-19 NOTE — Progress Notes (Signed)
Musculoskeletal Exam  Patient: [name] DOB: Jun 07, 1975  DOS: 09/19/2019  SUBJECTIVE:  Chief Complaint:   Chief Complaint  Patient presents with  . Back Pain    Diem DEERICA WASZAK is a 44 y.o.  female for evaluation and treatment of her back pain.  She is here with her spouse.  Onset:  2 weeks ago.  Running more and started doing wt resistance exercises. Thinks lunges did the trick.  Location: lower Character:  aching and sharp  Progression of issue:  has worsened Associated symptoms: difficulty walking Denies bowel/bladder incontinence or weakness Treatment: to date has been ice, OTC NSAIDS, acetaminophen and topical menthol.   Neurovascular symptoms: no   Past Medical History:  Diagnosis Date  . Anxiety   . Asthma   . Chronic kidney disease   . Depression   . Gallstones   . GERD (gastroesophageal reflux disease)   . RAD (reactive airway disease)     Objective:  VITAL SIGNS: BP 122/86 (BP Location: Left Arm, Patient Position: Sitting, Cuff Size: Normal)   Pulse 75   Temp (!) 95.9 F (35.5 C) (Temporal)   Ht 5\' 3"  (1.6 m)   Wt 150 lb (68 kg)   SpO2 98%   BMI 26.57 kg/m  Constitutional: Well formed, well developed. No acute distress. HENT: Normocephalic, atraumatic.  Thorax & Lungs:  No accessory muscle use Skin: Warm. Dry. No erythema. No rash.  Musculoskeletal: low back.   Tenderness to palpation: yes, over cephalad portion of gluteal muscles bilaterally, worse on the right Deformity: no Ecchymosis: no Straight leg test: negative for Poor hamstring flexibility b/l. No pain with stretching of the gluteal muscles or over the greater trochanter bilaterally Neurologic: Normal sensory function. No focal deficits noted. DTR's equal and symmetric in LE's. No clonus.  Gait is antalgic. Psychiatric: Normal mood. Age appropriate judgment and insight. Alert & oriented x 3.    Assessment:  Gluteal pain - Plan: predniSONE (DELTASONE) 20 MG tablet,  cyclobenzaprine (FLEXERIL) 10 MG tablet, traMADol (ULTRAM) 50 MG tablet  Plan: Stretches/exercises, heat, ice, Tylenol, prednisone burst, Flexeril as needed, tramadol as needed for breakthrough pain.  Activity as tolerated, listen to body. F/u prn. The patient voiced understanding and agreement to the plan.   [place], DO 09/19/19  12:02 PM

## 2019-10-09 ENCOUNTER — Ambulatory Visit (HOSPITAL_BASED_OUTPATIENT_CLINIC_OR_DEPARTMENT_OTHER)
Admission: RE | Admit: 2019-10-09 | Discharge: 2019-10-09 | Disposition: A | Discharge status: Home / Self Care | Payer: BC Managed Care – PPO | Source: Ambulatory Visit | Attending: Family Medicine | Admitting: Family Medicine

## 2019-10-09 ENCOUNTER — Other Ambulatory Visit: Payer: Self-pay

## 2019-10-09 ENCOUNTER — Encounter: Payer: Self-pay | Admitting: Family Medicine

## 2019-10-09 ENCOUNTER — Ambulatory Visit (INDEPENDENT_AMBULATORY_CARE_PROVIDER_SITE_OTHER): Payer: BC Managed Care – PPO | Admitting: Family Medicine

## 2019-10-09 VITALS — BP 123/78 | HR 87 | Temp 97.3°F | Resp 16 | Ht 63.5 in | Wt 161.0 lb

## 2019-10-09 DIAGNOSIS — M25551 Pain in right hip: Secondary | ICD-10-CM

## 2019-10-09 DIAGNOSIS — Z0000 Encounter for general adult medical examination without abnormal findings: Secondary | ICD-10-CM

## 2019-10-09 DIAGNOSIS — R21 Rash and other nonspecific skin eruption: Secondary | ICD-10-CM | POA: Diagnosis not present

## 2019-10-09 DIAGNOSIS — M7918 Myalgia, other site: Secondary | ICD-10-CM | POA: Diagnosis not present

## 2019-10-09 LAB — VITAMIN D 25 HYDROXY (VIT D DEFICIENCY, FRACTURES): VITD: 57.3 ng/mL (ref 30.00–100.00)

## 2019-10-09 LAB — COMPREHENSIVE METABOLIC PANEL
ALT: 32 U/L (ref 0–35)
AST: 29 U/L (ref 0–37)
Albumin: 4.1 g/dL (ref 3.5–5.2)
Alkaline Phosphatase: 49 U/L (ref 39–117)
BUN: 13 mg/dL (ref 6–23)
CO2: 33 mEq/L — ABNORMAL HIGH (ref 19–32)
Calcium: 9.1 mg/dL (ref 8.4–10.5)
Chloride: 99 mEq/L (ref 96–112)
Creatinine, Ser: 0.86 mg/dL (ref 0.40–1.20)
GFR: 70.92 mL/min (ref >60.00)
Glucose, Bld: 86 mg/dL (ref 70–99)
Potassium: 4.2 mEq/L (ref 3.5–5.1)
Sodium: 136 mEq/L (ref 135–145)
Total Bilirubin: 0.5 mg/dL (ref 0.2–1.2)
Total Protein: 6.1 g/dL (ref 6.0–8.3)

## 2019-10-09 LAB — CBC WITH DIFFERENTIAL/PLATELET
Basophils Absolute: 0.1 10*3/uL (ref 0.0–0.1)
Basophils Relative: 1.4 % (ref 0.0–3.0)
Eosinophils Absolute: 0.1 10*3/uL (ref 0.0–0.7)
Eosinophils Relative: 2.8 % (ref 0.0–5.0)
HCT: 39.1 % (ref 36.0–46.0)
Hemoglobin: 13.4 g/dL (ref 12.0–15.0)
Lymphocytes Relative: 28 % (ref 12.0–46.0)
Lymphs Abs: 1.3 10*3/uL (ref 0.7–4.0)
MCHC: 34.2 g/dL (ref 30.0–36.0)
MCV: 96.1 fl (ref 78.0–100.0)
Monocytes Absolute: 0.3 10*3/uL (ref 0.1–1.0)
Monocytes Relative: 6.9 % (ref 3.0–12.0)
Neutro Abs: 2.8 10*3/uL (ref 1.4–7.7)
Neutrophils Relative %: 60.9 % (ref 43.0–77.0)
Platelets: 284 10*3/uL (ref 150.0–400.0)
RBC: 4.07 Mil/uL (ref 3.87–5.11)
RDW: 12.6 % (ref 11.5–15.5)
WBC: 4.6 10*3/uL (ref 4.0–10.5)

## 2019-10-09 LAB — LIPID PANEL
Cholesterol: 185 mg/dL (ref 0–200)
HDL: 77.1 mg/dL (ref >39.00)
LDL Cholesterol: 93 mg/dL (ref 0–99)
NonHDL: 108.28
Total CHOL/HDL Ratio: 2
Triglycerides: 77 mg/dL (ref 0.0–149.0)
VLDL: 15.4 mg/dL (ref 0.0–40.0)

## 2019-10-09 LAB — VITAMIN B12: Vitamin B-12: 421 pg/mL (ref 211–911)

## 2019-10-09 LAB — HEMOGLOBIN A1C: Hgb A1c MFr Bld: 5.2 % (ref 4.6–6.5)

## 2019-10-09 LAB — TSH: TSH: 2.47 u[IU]/mL (ref 0.35–4.50)

## 2019-10-09 MED ORDER — TRIAMCINOLONE ACETONIDE 0.1 % EX CREA: 1.0000 "application " | TOPICAL_CREAM | Freq: Two times a day (BID) | CUTANEOUS | 0 refills | Status: DC

## 2019-10-09 MED ORDER — CYCLOBENZAPRINE HCL 10 MG PO TABS: 5.0000 mg | ORAL_TABLET | Freq: Three times a day (TID) | ORAL | 0 refills | Status: DC | PRN

## 2019-10-09 NOTE — Progress Notes (Signed)
Subjective:     [name] is a 44 y.o. female and is here for a comprehensive physical exam. The patient reports problems - R hip pain---- that radiates down R leg to calf .   Pt taking IB and muscle relaxer with some relief  She c/o rash on elbows and knees hair line ----- itchy   Social History   Socioeconomic History  . Marital status: Married    Spouse name: Not on file  . Number of children: 2  . Years of education: Not on file  . Highest education level: Not on file  Occupational History  . Occupation: Games developer: [employer]  Tobacco Use  . Smoking status: Never Smoker  . Smokeless tobacco: Never Used  Substance and Sexual Activity  . Alcohol use: Yes    Comment: rare  . Drug use: No  . Sexual activity: Yes  Other Topics Concern  . Not on file  Social History Narrative  . Not on file   Social Determinants of Health   Financial Resource Strain:   . Difficulty of Paying Living Expenses:   Food Insecurity:   . Worried About Charity fundraiser in the Last Year:   . Arboriculturist in the Last Year:   Transportation Needs:   . Film/video editor (Medical):   Marland Kitchen Lack of Transportation (Non-Medical):   Physical Activity:   . Days of Exercise per Week:   . Minutes of Exercise per Session:   Stress:   . Feeling of Stress :   Social Connections:   . Frequency of Communication with Friends and Family:   . Frequency of Social Gatherings with Friends and Family:   . Attends Religious Services:   . Active Member of Clubs or Organizations:   . Attends Archivist Meetings:   Marland Kitchen Marital Status:   Intimate Partner Violence:   . Fear of Current or Ex-Partner:   . Emotionally Abused:   Marland Kitchen Physically Abused:   . Sexually Abused:    Health Maintenance  Topic Date Due  . HIV Screening  Never done  . PAP SMEAR-Modifier  11/12/2016  . INFLUENZA VACCINE  12/14/2019  . MAMMOGRAM  01/14/2020  . TETANUS/TDAP  10/10/2021  . COVID-19  Vaccine  Completed    The following portions of the patient's history were reviewed and updated as appropriate:  She  has a past medical history of Anxiety, Asthma, Chronic kidney disease, Depression, Gallstones, GERD (gastroesophageal reflux disease), and RAD (reactive airway disease). She does not have any pertinent problems on file. 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 Her family history includes Arthritis in her paternal grandmother; BRCA 1/2 in her cousin and paternal grandmother; Breast cancer in her mother; Diabetes in her maternal grandmother; Hypertension in her father and mother; Pancreatic cancer in her maternal grandfather; Skin cancer in her father and mother. She  reports that she has never smoked. She has never used smokeless tobacco. She reports current alcohol use. She reports that she does not use drugs. She has a current medication list which includes the following prescription(s): albuterol, buspirone, cyclobenzaprine, doxepin hcl, escitalopram, fluticasone, hydrocortisone, hydrocortisone cream, levonorgestrel, NONFORMULARY OR COMPOUNDED ITEM, omeprazole, probiotic product, tretinoin, triamcinolone cream, and triamcinolone cream. Current Outpatient Medications on File Prior to Visit  Medication Sig Dispense Refill  . albuterol (PROAIR HFA) 108 (90 BASE) MCG/ACT inhaler INHALE 2 PUFFS FOUR TIMES  DAILY AS NEEDED (Patient taking differently: Inhale 1-2 puffs into the lungs every 6 (six) hours as needed for wheezing or shortness of breath. ) 8.5 g 1  . busPIRone (BUSPAR) 10 MG tablet Take 10 mg by mouth 2 (two) times daily.    . Doxepin HCl 6 MG TABS Take 1 tablet by mouth at bedtime.    Marland Kitchen escitalopram (LEXAPRO) 20 MG tablet Take 20 mg by mouth daily.    . fluticasone (FLONASE) 50 MCG/ACT nasal spray Place 2 sprays into both nostrils at bedtime. 48 g 0  . hydrocortisone (ANUSOL-HC) 2.5 %  rectal cream PLACE 1 APPLICATION RECTALLY 2 TIMES DAILY 30 g 0  . hydrocortisone cream 0.5 % Apply 1 application topically 2 (two) times daily as needed (for ezcema/itchy skin on elbow).    Marland Kitchen levonorgestrel (MIRENA) 20 MCG/24HR IUD 1 each by Intrauterine route once.    . NONFORMULARY OR COMPOUNDED ITEM Bariatric multivitamin 1 po qd 90 each 3  . omeprazole (PRILOSEC) 40 MG capsule TAKE 1 CAPSULE BY MOUTH EVERY DAY 90 capsule 1  . Probiotic Product (PROBIOTIC PO) Take by mouth.    . tretinoin (RETIN-A) 0.05 % cream APPLY TO FACE ONCE DAILY  AT BEDTIME  3  . triamcinolone cream (KENALOG) 0.1 %      No current facility-administered medications on file prior to visit.   She is allergic to amoxicillin; labetalol hcl; and sulfonamide derivatives..  Review of Systems Review of Systems  Constitutional: Negative for activity change, appetite change and fatigue.  HENT: Negative for hearing loss, congestion, tinnitus and ear discharge.  dentist q18mEyes: Negative for visual disturbance (see optho q1y -- vision corrected to 20/20 with glasses).  Respiratory: Negative for cough, chest tightness and shortness of breath.   Cardiovascular: Negative for chest pain, palpitations and leg swelling.  Gastrointestinal: Negative for abdominal pain, diarrhea, constipation and abdominal distention.  Genitourinary: Negative for urgency, frequency, decreased urine volume and difficulty urinating.  Musculoskeletal: Negative for back pain, arthralgias and gait problem.  Skin: Negative for color change, pallor and rash.  Neurological: Negative for dizziness, light-headedness, numbness and headaches.  Hematological: Negative for adenopathy. Does not bruise/bleed easily.  Psychiatric/Behavioral: Negative for suicidal ideas, confusion, sleep disturbance, self-injury, dysphoric mood, decreased concentration and agitation.       Objective:    BP 123/78 (BP Location: Right Arm, Patient Position: Sitting, Cuff Size:  Small)   Pulse 87   Temp (!) 97.3 F (36.3 C) (Temporal)   Resp 16   Ht 5' 3.5" (1.613 m)   Wt 161 lb (73 kg)   SpO2 100%   BMI 28.07 kg/m  General appearance: alert, cooperative, appears stated age and no distress Head: Normocephalic, without obvious abnormality, atraumatic Eyes: negative findings: lids and lashes normal, conjunctivae and sclerae normal and pupils equal, round, reactive to light and accomodation Ears: normal TM's and external ear canals both ears Nose: Nares normal. Septum midline. Mucosa normal. No drainage or sinus tenderness. Back: symmetric, no curvature. ROM normal. No CVA tenderness. Lungs: clear to auscultation bilaterally Breasts: gyn Heart: regular rate and rhythm, S1, S2 normal, no murmur, click, rub or gallop Abdomen: soft, non-tender; bowel sounds normal; no masses,  no organomegaly Pelvic: deferred---gyn  Extremities: extremities normal, atraumatic, no cyanosis or edema Pulses: 2+ and symmetric Skin: Skin color, texture, turgor normal. No rashes or lesions Lymph nodes: Cervical, supraclavicular, and axillary nodes normal. Neurologic: Alert and oriented X 3, normal strength and tone. Normal symmetric reflexes. Normal coordination and  gait    Assessment:    Healthy female exam.      Plan:    ghm utd Check labs  See After Visit Summary for Counseling Recommendations    1. Gluteal pain ? Etiology low back Check xray  - cyclobenzaprine (FLEXERIL) 10 MG tablet; Take 0.5-1 tablets (5-10 mg total) by mouth 3 (three) times daily as needed for muscle spasms.  Dispense: 21 tablet; Refill: 0  2. Right hip pain prob from back Muscle relaxer renewed  - DG Lumbar Spine Complete; Future  3. Rash F/u with derm if no better  - triamcinolone cream (KENALOG) 0.1 %; Apply 1 application topically 2 (two) times daily.  Dispense: 30 g; Refill: 0  4. Preventative health care See above

## 2019-10-09 NOTE — Patient Instructions (Signed)

## 2019-10-10 ENCOUNTER — Encounter: Payer: Self-pay | Admitting: Family Medicine

## 2019-10-10 ENCOUNTER — Other Ambulatory Visit: Payer: Self-pay | Admitting: Family Medicine

## 2019-10-10 DIAGNOSIS — M5136 Other intervertebral disc degeneration, lumbar region: Secondary | ICD-10-CM

## 2019-10-10 NOTE — Telephone Encounter (Signed)
Pt asking PT would be a option since xray was normal  Please advise

## 2019-10-10 NOTE — Telephone Encounter (Signed)
Yes it may  Referral placed

## 2019-10-15 ENCOUNTER — Ambulatory Visit: Payer: BC Managed Care – PPO | Attending: Family Medicine | Admitting: Physical Therapy

## 2019-10-15 ENCOUNTER — Encounter: Payer: Self-pay | Admitting: Physical Therapy

## 2019-10-15 ENCOUNTER — Other Ambulatory Visit: Payer: Self-pay

## 2019-10-15 DIAGNOSIS — M62830 Muscle spasm of back: Secondary | ICD-10-CM | POA: Insufficient documentation

## 2019-10-15 DIAGNOSIS — M5441 Lumbago with sciatica, right side: Secondary | ICD-10-CM | POA: Diagnosis not present

## 2019-10-15 NOTE — Therapy (Signed)
[place], [place], [address] Phone: [phone]   Fax:  [phone]  Physical Therapy Evaluation  Patient Details  Name: [name] MRN: [id number] Date of Birth: [birth year] Referring Provider (PT): Carollee Herter   Encounter Date: 10/15/2019  PT End of Session - 10/15/19 1612    Visit Number  1    Date for PT Re-Evaluation  12/15/19    Authorization Type  BCBS    PT Start Time  1526    PT Stop Time  1612    PT Time Calculation (min)  46 min    Activity Tolerance  Patient tolerated treatment well    Behavior During Therapy  [facility] for tasks assessed/performed       Past Medical History:  Diagnosis Date  . Anxiety   . Asthma   . Chronic kidney disease   . Depression   . Gallstones   . GERD (gastroesophageal reflux disease)   . RAD (reactive airway disease)     Past Surgical History:  Procedure Laterality Date  . CESAREAN SECTION    . CHOLECYSTECTOMY  2007  . 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   Procedure: LAPAROSCOPIC ROUX-EN-Y GASTRIC BYPASS WITH UPPER ENDOSCOPY;  Surgeon: Greer Pickerel, MD;  Location: [facility];  Service: General;  Laterality: N/A;    There were no vitals filed for this visit.   Subjective Assessment - 10/15/19 1529    Subjective  Patient reports that about 4 weeks ago she started having low back pain, she thinks the only thing she did was change her posture while running, she reports that she was running 6-8 miles a week.  She reports that she had some right foot pain prior, x-rays showed DDD, she reports that the past few weeks the pain has been more right sciatica type.    Limitations  Standing;Walking;Lifting;House hold activities    Patient Stated Goals  have less pain, return to running    Currently in Pain?  Yes    Pain Score  2     Pain Location  Buttocks    Pain Orientation  Right    Pain Descriptors / Indicators  Dull;Aching;Sharp    Pain Type  Acute pain    Pain Radiating Towards  sharp pain in the right buttock., down into the calf it is dull and achey    Pain Onset  More than a month ago    Pain Frequency  Constant    Aggravating Factors   running, bending, trying to cross leg over, sneeze, pain up to 10/10    Pain Relieving Factors  heat, biofreezeat best a 2/10    Effect of Pain on Daily Activities  cannot run, difficulty getting up and down, getting dressed         [facility] PT Assessment - 10/15/19 0001      Assessment   Medical Diagnosis  LBP, DDD, right sciatica    Referring Provider (PT)  Etter Sjogren Chase    Onset Date/Surgical Date  09/17/19    Prior Therapy  no      Precautions   Precautions  None      Balance Screen   Has the patient fallen in the past 6 months  No    Has the patient had a decrease in activity level because of a fear of falling?   No    Is the patient reluctant to leave their home because of a fear of falling?  No      Home Environment   Additional Comments  has stiars, does housework and yardwork      Prior Function   Level of Independence  Independent    Vocation  Full time employment    Gaffer  at desk    Leisure  running 6-8 miles a week, yoga, some weights      Posture/Postural Control   Posture Comments  slightly slouched posture, rounded shoulders      ROM / Strength   AROM / PROM / Strength  AROM;Strength      AROM   Overall AROM Comments  Lumbar ROM is decreased 50% for flexion and extension and side bending, fear and some pain in the right low back and leg were limiting, right hip flexion WNL's hip extension painful and limited to 15 degrees      Strength   Overall Strength Comments  4/5 hips without increase of pain      Flexibility   Soft Tissue Assessment /Muscle Length  yes    Hamstrings  30 degrees SLR on the right iwht pain    ITB  tight with pain    Piriformis  tight wiiht pain      Palpation   Palpation comment  very tight in the lumbar area, tender in the right  buttock and ITB                  Objective measurements completed on examination: See above findings.                PT Short Term Goals - 10/15/19 1615      PT SHORT TERM GOAL #1   Title  indepednent with initial HEP    Time  2    Period  Weeks    Status  New        PT Long Term Goals - 10/15/19 1617      PT LONG TERM GOAL #1   Title  understand posture and body mechanics    Time  8    Period  Weeks    Status  New      PT LONG TERM GOAL #2   Title  decrease pain overall 50%    Time  8    Period  Weeks    Status  New      PT LONG TERM GOAL #3   Title  increase lumbar ROM 50%    Time  8    Period  Weeks    Status  New      PT LONG TERM GOAL #4   Title  return to jogging without complications    Time  8    Period  Weeks    Status  New             Plan - 10/15/19 1612    Clinical Impression Statement  Patient reports that she started having LBP about 4 weeks ago, reports she runs 6-8 mils a week and does some weights, reports that she was recovering from a foot issue when she started having the back pain, she reports that the pain over the past few weeks has been more right sciatica from the right buttock to the rgiht calf.  She has SLR on the right to 30 degrees with pain, very tight in the lumbar area, seems to have HNP due to pain a 10/10 with coughing and sneezing and with extension    Stability/Clinical Decision Making  Evolving/Moderate complexity    Clinical Decision Making  Low    Rehab Potential  Good    PT Frequency  2x / week    PT Duration  8 weeks    PT Treatment/Interventions  Electrical Stimulation;Cryotherapy;Traction;Moist Heat;ADLs/Self Care Home Management;Functional mobility training;Therapeutic activities;Therapeutic exercise;Iontophoresis 4mg /ml Dexamethasone;Neuromuscular re-education;Manual techniques;Patient/family education    PT Next Visit Plan  gave HEP for some flexibility, nerve flossing, and started some  extension, try to get her moving again    Consulted and Agree with Plan of Care  Patient       Patient will benefit from skilled therapeutic intervention in order to improve the following deficits and impairments:  Decreased range of motion, Difficulty walking, Increased muscle spasms, Pain, Decreased activity tolerance, Impaired flexibility, Improper body mechanics, Postural dysfunction, Decreased strength  Visit Diagnosis: Acute right-sided low back pain with right-sided sciatica - Plan: PT plan of care cert/re-cert  Muscle spasm of back - Plan: PT plan of care cert/re-cert     Problem List Patient Active Problem List   Diagnosis Date Noted  . Right foot pain 07/25/2019  . Muscle spasm of shoulder region 04/22/2019  . Abdominal pain 10/08/2018  . Left leg pain 11/05/2017  . Pain of right thumb 11/05/2017  . Preventative health care 11/05/2017  . 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  . Lumbar disc disease 08/28/2016  . 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 Maxillary sinusitis, acute 06/09/2015  . Left acute otitis media 06/09/2015  . Knee pain, left 03/22/2015  . Conjunctivitis 03/27/2014  . Ear pain 03/27/2014  . Dyspepsia and other specified disorders of function of stomach 06/02/2013  . Anxiety state, unspecified 06/02/2013  . Severe obesity (BMI >= 40) (HCC) 12/24/2012  . VITAMIN B12 DEFICIENCY 10/16/2008  . DEPRESSION 09/04/2006  . GERD 09/04/2006  . COLPOSCOPY, HX OF 09/04/2006    09/06/2006., PT 10/15/2019, 4:21 PM  [facility] [place] [address] [place], [place], [place] Phone: [phone]   Fax:  [phone]  Name: [name] MRN: Almetta Lovely Date of Birth: 1976-01-07

## 2019-10-15 NOTE — Patient Instructions (Signed)
Access Code: [email or web address] URL: [email or web address] Date: 10/15/2019 Prepared by: Stacie Glaze  Exercises Supine Piriformis Stretch Pulling Heel to Hip - 3 x daily - 7 x weekly - 1 sets - 10 reps - 30 hold Supine Hamstring Stretch with Strap - 3 x daily - 7 x weekly - 1 sets - 10 reps - 30 hold Seated Thoracic Lumbar Extension - 3 x daily - 7 x weekly - 1 sets - 10 reps - 3 hold Standing Lumbar Extension - 3 x daily - 7 x weekly - 1 sets - 10 reps - 2 hold Prone on Elbows Stretch - 2 x daily - 7 x weekly - 1 sets - 10 reps - 10 hold

## 2019-10-21 ENCOUNTER — Other Ambulatory Visit: Payer: Self-pay

## 2019-10-21 ENCOUNTER — Ambulatory Visit: Payer: BC Managed Care – PPO | Admitting: Physical Therapy

## 2019-10-21 ENCOUNTER — Encounter: Payer: Self-pay | Admitting: Physical Therapy

## 2019-10-21 DIAGNOSIS — M5441 Lumbago with sciatica, right side: Secondary | ICD-10-CM

## 2019-10-21 DIAGNOSIS — M62830 Muscle spasm of back: Secondary | ICD-10-CM

## 2019-10-21 NOTE — Therapy (Signed)
[place], [place], [address] Phone: [phone]   Fax:  [phone]  Physical Therapy Treatment  Patient Details  Name: [name] MRN: [id number] Date of Birth: 03-Sep-1975 Referring Provider (PT): Carollee Herter   Encounter Date: 10/21/2019  PT End of Session - 10/21/19 1645    Visit Number  2    Date for PT Re-Evaluation  12/15/19    Authorization Type  BCBS    PT Start Time  1600    PT Stop Time  1645    PT Time Calculation (min)  45 min    Activity Tolerance  Patient tolerated treatment well    Behavior During Therapy  [facility] for tasks assessed/performed       Past Medical History:  Diagnosis Date  . Anxiety   . Asthma   . Chronic kidney disease   . Depression   . Gallstones   . GERD (gastroesophageal reflux disease)   . RAD (reactive airway disease)     Past Surgical History:  Procedure Laterality Date  . CESAREAN SECTION    . CHOLECYSTECTOMY  2007  . 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   Procedure: LAPAROSCOPIC ROUX-EN-Y GASTRIC BYPASS WITH UPPER ENDOSCOPY;  Surgeon: Greer Pickerel, MD;  Location: [facility];  Service: General;  Laterality: N/A;    There were no vitals filed for this visit.  Subjective Assessment - 10/21/19 1601    Subjective  "Doing pretty good today" HEP seems to be helping    Currently in Pain?  No/denies                        [facility] Adult PT Treatment/Exercise - 10/21/19 0001      Exercises   Exercises  Lumbar      Lumbar Exercises: Stretches   Passive Hamstring Stretch  Left;Right;4 reps;10 seconds;20 seconds    Single Knee to Chest Stretch  Left;Right;4 reps;10 seconds    Double Knee to Chest Stretch  2 reps;10 seconds;20 seconds    ITB Stretch  Right;3 reps;10 seconds    Piriformis Stretch  Left;Right;4 reps;10 seconds;20 seconds      Lumbar Exercises: Aerobic   Nustep  L5 x6 min       Lumbar Exercises: Machines for Strengthening   Other Lumbar Machine Exercise  Rows & Lats 25lb 2x10       Lumbar Exercises: Standing   Shoulder Extension  20 reps;Both;Power Tower;Strengthening    Shoulder Extension Limitations  10    Other Standing Lumbar Exercises  AR press 15lb x10 each side       Lumbar Exercises: Supine   Ab Set  3 seconds;20 reps    Other Supine Lumbar Exercises  LE on pball bridges, K2C, Oblq                PT Short Term Goals - 10/15/19 1615      PT SHORT TERM GOAL #1   Title  indepednent with initial HEP    Time  2    Period  Weeks    Status  New        PT Long Term Goals - 10/15/19 1617      PT LONG TERM GOAL #1   Title  understand posture and body mechanics    Time  8    Period  Weeks    Status  New      PT LONG TERM GOAL #2  Title  decrease pain overall 50%    Time  8    Period  Weeks    Status  New      PT LONG TERM GOAL #3   Title  increase lumbar ROM 50%    Time  8    Period  Weeks    Status  New      PT LONG TERM GOAL #4   Title  return to jogging without complications    Time  8    Period  Weeks    Status  New            Plan - 10/21/19 1645    Clinical Impression Statement  Pt tolerated an initial progression to TE well, evident by no subjective reports of increase pain. She reports that she has not has the sharp stabbing pain since she started her HEP. She complete all of the interventions well. She did require postural cues with shoulder extensions. Some core fatigue noted with anti rotational presses. She does report a pulling sensation in her R glute with supine bridges.    Stability/Clinical Decision Making  Evolving/Moderate complexity    Rehab Potential  Good    PT Frequency  2x / week    PT Duration  8 weeks    PT Next Visit Plan  nerve flossing, and started some extension, try to get her moving again       Patient will benefit from skilled therapeutic intervention in order to improve the following deficits and impairments:  Decreased range of  motion, Difficulty walking, Increased muscle spasms, Pain, Decreased activity tolerance, Impaired flexibility, Improper body mechanics, Postural dysfunction, Decreased strength  Visit Diagnosis: Acute right-sided low back pain with right-sided sciatica  Muscle spasm of back     Problem List Patient Active Problem List   Diagnosis Date Noted  . Right foot pain 07/25/2019  . Muscle spasm of shoulder region 04/22/2019  . Abdominal pain 10/08/2018  . Left leg pain 11/05/2017  . Pain of right thumb 11/05/2017  . Preventative health care 11/05/2017  . 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  . Lumbar disc disease 08/28/2016  . 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 Maxillary sinusitis, acute 06/09/2015  . Left acute otitis media 06/09/2015  . Knee pain, left 03/22/2015  . Conjunctivitis 03/27/2014  . Ear pain 03/27/2014  . Dyspepsia and other specified disorders of function of stomach 06/02/2013  . Anxiety state, unspecified 06/02/2013  . Severe obesity (BMI >= 40) (HCC) 12/24/2012  . VITAMIN B12 DEFICIENCY 10/16/2008  . DEPRESSION 09/04/2006  . GERD 09/04/2006  . COLPOSCOPY, HX OF 09/04/2006    Grayce Sessions 10/21/2019, 4:51 PM  [facility] [place] [address] [place], [place], [address] Phone: [phone]   Fax:  [phone]  Name: [name] MRN: [id number] Date of Birth: 14-May-1976

## 2019-10-23 ENCOUNTER — Other Ambulatory Visit: Payer: Self-pay

## 2019-10-23 ENCOUNTER — Ambulatory Visit: Payer: BC Managed Care – PPO | Admitting: Physical Therapy

## 2019-10-23 ENCOUNTER — Encounter: Payer: Self-pay | Admitting: Physical Therapy

## 2019-10-23 DIAGNOSIS — M62830 Muscle spasm of back: Secondary | ICD-10-CM

## 2019-10-23 DIAGNOSIS — M5441 Lumbago with sciatica, right side: Secondary | ICD-10-CM

## 2019-10-23 NOTE — Therapy (Signed)
[name] [place], [place], [address] Phone: [phone]   Fax:  [phone]  Physical Therapy Treatment  Patient Details  Name: [name] MRN: [id number] Date of Birth: 06/27/1975 Referring Provider (PT): Carollee Herter   Encounter Date: 10/23/2019   PT End of Session - 10/23/19 1013    Visit Number 3    Date for PT Re-Evaluation 12/15/19    PT Start Time 0930    PT Stop Time 1013    PT Time Calculation (min) 43 min    Activity Tolerance Patient tolerated treatment well    Behavior During Therapy [facility] for tasks assessed/performed           Past Medical History:  Diagnosis Date  . Anxiety   . Asthma   . Chronic kidney disease   . Depression   . Gallstones   . GERD (gastroesophageal reflux disease)   . RAD (reactive airway disease)     Past Surgical History:  Procedure Laterality Date  . CESAREAN SECTION    . CHOLECYSTECTOMY  2007  . 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   Procedure: LAPAROSCOPIC ROUX-EN-Y GASTRIC BYPASS WITH UPPER ENDOSCOPY;  Surgeon: Greer Pickerel, MD;  Location: [facility];  Service: General;  Laterality: N/A;    There were no vitals filed for this visit.   Subjective Assessment - 10/23/19 0932    Subjective "Just a little sore" Pt points to her glute hamstring tie in area on R side    Currently in Pain? Yes    Pain Score 3     Pain Location Buttocks    Pain Orientation Right    Pain Descriptors / Indicators Sore                             [facility] Adult PT Treatment/Exercise - 10/23/19 0001      Lumbar Exercises: Aerobic   Recumbent Bike L2 x 4 min     Nustep L4 x 5 min       Lumbar Exercises: Machines for Strengthening   Cybex Lumbar Extension black band 2x10     Other Lumbar Machine Exercise Rows & Lats 35lb 2x10       Lumbar Exercises: Standing   Other Standing Lumbar Exercises Overhead Back Ext yello ball 2x10     Other Standing Lumbar  Exercises Hip abd and Ext 5lb 2x10 reach both LE's       Lumbar Exercises: Supine   Ab Set 3 seconds;20 reps    Dead Bug 10 reps;2 seconds    Bridge 15 reps;Compliant;2 seconds      Manual Therapy   Manual Therapy Neural Stretch;Passive ROM    Manual therapy comments ITB, piriformis, glute end range stretching    Passive ROM R hip In al directions    Neural Stretch Nerve flossing                    PT Short Term Goals - 10/23/19 1015      PT SHORT TERM GOAL #1   Title indepednent with initial HEP    Status Achieved             PT Long Term Goals - 10/15/19 1617      PT LONG TERM GOAL #1   Title understand posture and body mechanics    Time 8    Period Weeks    Status New  PT LONG TERM GOAL #2   Title decrease pain overall 50%    Time 8    Period Weeks    Status New      PT LONG TERM GOAL #3   Title increase lumbar ROM 50%    Time 8    Period Weeks    Status New      PT LONG TERM GOAL #4   Title return to jogging without complications    Time 8    Period Weeks    Status New                 Plan - 10/23/19 1013    Clinical Impression Statement Pt did well completing the exercises interventions. She reports a pinching sensation in the low back on R side with lumbar and hip extensions. Sharp shooting pain noted with HS stretching that went away as MT progressed, HD did not feel tight at the level of Stretch. Pain was replicated with nerve flossing dorsi flexing ankle.    Stability/Clinical Decision Making Evolving/Moderate complexity    Rehab Potential Good    PT Duration 8 weeks    PT Treatment/Interventions Electrical Stimulation;Cryotherapy;Traction;Moist Heat;ADLs/Self Care Home Management;Functional mobility training;Therapeutic activities;Therapeutic exercise;Iontophoresis 4mg /ml Dexamethasone;Neuromuscular re-education;Manual techniques;Patient/family education    PT Next Visit Plan nerve flossing, and started some extension, try to  get her moving again           Patient will benefit from skilled therapeutic intervention in order to improve the following deficits and impairments:  Decreased range of motion, Difficulty walking, Increased muscle spasms, Pain, Decreased activity tolerance, Impaired flexibility, Improper body mechanics, Postural dysfunction, Decreased strength  Visit Diagnosis: Acute right-sided low back pain with right-sided sciatica  Muscle spasm of back     Problem List Patient Active Problem List   Diagnosis Date Noted  . Right foot pain 07/25/2019  . Muscle spasm of shoulder region 04/22/2019  . Abdominal pain 10/08/2018  . Left leg pain 11/05/2017  . Pain of right thumb 11/05/2017  . Preventative health care 11/05/2017  . 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  . Lumbar disc disease 08/28/2016  . 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 Maxillary sinusitis, acute 06/09/2015  . Left acute otitis media 06/09/2015  . Knee pain, left 03/22/2015  . Conjunctivitis 03/27/2014  . Ear pain 03/27/2014  . Dyspepsia and other specified disorders of function of stomach 06/02/2013  . Anxiety state, unspecified 06/02/2013  . Severe obesity (BMI >= 40) (HCC) 12/24/2012  . VITAMIN B12 DEFICIENCY 10/16/2008  . DEPRESSION 09/04/2006  . GERD 09/04/2006  . COLPOSCOPY, HX OF 09/04/2006    09/06/2006, PTA 10/23/2019, 10:16 AM  [facility] [place] [address] [place], [place], [place] Phone: [phone]   Fax:  [phone]  Name: [name] MRN: Almetta Lovely Date of Birth: [birth year]

## 2019-10-28 ENCOUNTER — Ambulatory Visit: Payer: BC Managed Care – PPO | Admitting: Physical Therapy

## 2019-10-28 ENCOUNTER — Encounter: Payer: Self-pay | Admitting: Physical Therapy

## 2019-10-28 ENCOUNTER — Other Ambulatory Visit: Payer: Self-pay

## 2019-10-28 DIAGNOSIS — M5441 Lumbago with sciatica, right side: Secondary | ICD-10-CM

## 2019-10-28 DIAGNOSIS — M62830 Muscle spasm of back: Secondary | ICD-10-CM

## 2019-10-28 NOTE — Therapy (Signed)
[place], [place], [address] Phone: [phone]   Fax:  [phone]  Physical Therapy Treatment  Patient Details  Name: [name] MRN: [id number] Date of Birth: 02-Oct-1975 Referring Provider (PT): Carollee Herter   Encounter Date: 10/28/2019   PT End of Session - 10/28/19 0922    Visit Number 4    Date for PT Re-Evaluation 12/15/19    Authorization Type BCBS    PT Start Time 0840    PT Stop Time 0932    PT Time Calculation (min) 52 min    Activity Tolerance Patient tolerated treatment well    Behavior During Therapy [facility] for tasks assessed/performed           Past Medical History:  Diagnosis Date  . Anxiety   . Asthma   . Chronic kidney disease   . Depression   . Gallstones   . GERD (gastroesophageal reflux disease)   . RAD (reactive airway disease)     Past Surgical History:  Procedure Laterality Date  . CESAREAN SECTION    . CHOLECYSTECTOMY  2007  . 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   Procedure: LAPAROSCOPIC ROUX-EN-Y GASTRIC BYPASS WITH UPPER ENDOSCOPY;  Surgeon: Greer Pickerel, MD;  Location: [facility];  Service: General;  Laterality: N/A;    There were no vitals filed for this visit.   Subjective Assessment - 10/28/19 0840    Subjective Really sore today, started this morning, not sure why.    Currently in Pain? No/denies                             [facility] Adult PT Treatment/Exercise - 10/28/19 0001      Lumbar Exercises: Aerobic   Recumbent Bike I10 R5 x 3 min     Nustep L4 x 5 min       Lumbar Exercises: Machines for Strengthening   Cybex Lumbar Extension black band 2x10     Other Lumbar Machine Exercise Rows & Lats 35lb 2x10       Lumbar Exercises: Standing   Shoulder Extension 20 reps;Both;Power Tower;Strengthening    Shoulder Extension Limitations 10    Other Standing Lumbar Exercises AR press 15lb x10 each side     Other Standing Lumbar  Exercises resistes side step  30lb x5 each       Lumbar Exercises: Supine   Ab Set 3 seconds;20 reps    Dead Bug 10 reps;2 seconds    Bridge 15 reps;Compliant;2 seconds      Modalities   Modalities Traction      Traction   Type of Traction Lumbar    Min (lbs) 50    Max (lbs) 60    Hold Time 60    Rest Time 20    Time 12      Manual Therapy   Manual Therapy Neural Stretch;Passive ROM    Manual therapy comments ITB, piriformis, glute end range stretching    Passive ROM R hip In al directions    Neural Stretch Nerve flossing                    PT Short Term Goals - 10/28/19 2831      PT SHORT TERM GOAL #1   Title indepednent with initial HEP    Status Achieved             PT Long Term Goals -  10/28/19 0927      PT LONG TERM GOAL #1   Title understand posture and body mechanics    Status Partially Met      PT LONG TERM GOAL #2   Title decrease pain overall 50%    Status On-going      PT LONG TERM GOAL #3   Title increase lumbar ROM 50%    Status On-going      PT LONG TERM GOAL #4   Title return to jogging without complications    Status On-going                 Plan - 10/28/19 9518    Clinical Impression Statement Some improvement noted over the weekend, but reported going on a walk monday night and noted sourness. Soreness remained this morning. Completed all of today's interventions. initial piriformis tightness in R side that's loosened as stretched progressed. Pain again with nerve flossing in RLE. No issues with traction.    Stability/Clinical Decision Making Evolving/Moderate complexity    Rehab Potential Good    PT Frequency 2x / week    PT Next Visit Plan assess Traction, nerve flossing, and started some extension, try to get her moving again           Patient will benefit from skilled therapeutic intervention in order to improve the following deficits and impairments:  Decreased range of motion, Difficulty walking, Increased  muscle spasms, Pain, Decreased activity tolerance, Impaired flexibility, Improper body mechanics, Postural dysfunction, Decreased strength  Visit Diagnosis: Muscle spasm of back  Acute right-sided low back pain with right-sided sciatica     Problem List Patient Active Problem List   Diagnosis Date Noted  . Right foot pain 07/25/2019  . Muscle spasm of shoulder region 04/22/2019  . Abdominal pain 10/08/2018  . Left leg pain 11/05/2017  . Pain of right thumb 11/05/2017  . Preventative health care 11/05/2017  . 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  . Lumbar disc disease 08/28/2016  . 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 Maxillary sinusitis, acute 06/09/2015  . Left acute otitis media 06/09/2015  . Knee pain, left 03/22/2015  . Conjunctivitis 03/27/2014  . Ear pain 03/27/2014  . Dyspepsia and other specified disorders of function of stomach 06/02/2013  . Anxiety state, unspecified 06/02/2013  . Severe obesity (BMI >= 40) ([place]) 12/24/2012  . VITAMIN B12 DEFICIENCY 10/16/2008  . DEPRESSION 09/04/2006  . GERD 09/04/2006  . COLPOSCOPY, HX OF 09/04/2006    Scot Jun, PTA 10/28/2019, 9:27 AM  [place], [place], [address] Phone: [phone]   Fax:  [phone]  Name: [name] MRN: [id number] Date of Birth: [birth year]

## 2019-10-30 ENCOUNTER — Other Ambulatory Visit: Payer: Self-pay

## 2019-10-30 ENCOUNTER — Ambulatory Visit: Payer: BC Managed Care – PPO | Admitting: Physical Therapy

## 2019-10-30 ENCOUNTER — Encounter: Payer: Self-pay | Admitting: Physical Therapy

## 2019-10-30 DIAGNOSIS — M62830 Muscle spasm of back: Secondary | ICD-10-CM

## 2019-10-30 DIAGNOSIS — M5441 Lumbago with sciatica, right side: Secondary | ICD-10-CM | POA: Diagnosis not present

## 2019-10-30 NOTE — Therapy (Signed)
[place], [place], [address] Phone: [phone]   Fax:  [phone]  Physical Therapy Treatment  Patient Details  Name: [name] MRN: [id number] Date of Birth: Nov 28, 1975 Referring Provider (PT): Carollee Herter   Encounter Date: 10/30/2019   PT End of Session - 10/30/19 1053    Visit Number 5    Date for PT Re-Evaluation 12/15/19    Authorization Type BCBS    PT Start Time 1015    PT Stop Time 1101    PT Time Calculation (min) 46 min    Activity Tolerance Patient tolerated treatment well    Behavior During Therapy [facility] for tasks assessed/performed           Past Medical History:  Diagnosis Date  . Anxiety   . Asthma   . Chronic kidney disease   . Depression   . Gallstones   . GERD (gastroesophageal reflux disease)   . RAD (reactive airway disease)     Past Surgical History:  Procedure Laterality Date  . CESAREAN SECTION    . CHOLECYSTECTOMY  2007  . 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   Procedure: LAPAROSCOPIC ROUX-EN-Y GASTRIC BYPASS WITH UPPER ENDOSCOPY;  Surgeon: Greer Pickerel, MD;  Location: [facility];  Service: General;  Laterality: N/A;    There were no vitals filed for this visit.   Subjective Assessment - 10/30/19 1018    Subjective "I think the traction helped" "If we were at 50% I think we are at 89% better now"    Currently in Pain? Yes    Pain Score 2     Pain Location Buttocks    Pain Orientation Right                             [facility] Adult PT Treatment/Exercise - 10/30/19 0001      Lumbar Exercises: Aerobic   Recumbent Bike L2 x 4 min     Nustep L4 x 5 min       Lumbar Exercises: Machines for Strengthening   Cybex Lumbar Extension black band 2x10     Other Lumbar Machine Exercise Rows & Lats 35lb 2x10       Lumbar Exercises: Standing   Other Standing Lumbar Exercises [place] arm pull downs 20lb 2x10     Other Standing Lumbar Exercises  resistes side step  30lb x5 each; Fermers carry 8lb, Contral lateral 7lb March 2x 10 each side      Lumbar Exercises: Seated   Other Seated Lumbar Exercises Sit to Stand w/ OHP yellow ball 2x8       Traction   Type of Traction Lumbar    Min (lbs) 55    Max (lbs) 65    Hold Time 60    Rest Time 20    Time 12                    PT Short Term Goals - 10/28/19 0927      PT SHORT TERM GOAL #1   Title indepednent with initial HEP    Status Achieved             PT Long Term Goals - 10/28/19 0927      PT LONG TERM GOAL #1   Title understand posture and body mechanics    Status Partially Met      PT LONG TERM GOAL #2  Title decrease pain overall 50%    Status On-going      PT LONG TERM GOAL #3   Title increase lumbar ROM 50%    Status On-going      PT LONG TERM GOAL #4   Title return to jogging without complications    Status On-going                 Plan - 10/30/19 1053    Clinical Impression Statement Improvement overall form last session. Added more core functional strength interventions without issue. Constant cues given to maintain core engagement with standing marches and farmers carry. No reports of increase pain. Continued with traction due to pt reports of improvement.    Stability/Clinical Decision Making Evolving/Moderate complexity    Rehab Potential Good    PT Frequency 2x / week    PT Duration 8 weeks    PT Treatment/Interventions Electrical Stimulation;Cryotherapy;Traction;Moist Heat;ADLs/Self Care Home Management;Functional mobility training;Therapeutic activities;Therapeutic exercise;Iontophoresis '4mg'$ /ml Dexamethasone;Neuromuscular re-education;Manual techniques;Patient/family education    PT Next Visit Plan assess Traction, nerve flossing, and started some extension, try to get her moving again           Patient will benefit from skilled therapeutic intervention in order to improve the following deficits and impairments:  Decreased  range of motion, Difficulty walking, Increased muscle spasms, Pain, Decreased activity tolerance, Impaired flexibility, Improper body mechanics, Postural dysfunction, Decreased strength  Visit Diagnosis: Muscle spasm of back     Problem List Patient Active Problem List   Diagnosis Date Noted  . Right foot pain 07/25/2019  . Muscle spasm of shoulder region 04/22/2019  . Abdominal pain 10/08/2018  . Left leg pain 11/05/2017  . Pain of right thumb 11/05/2017  . Preventative health care 11/05/2017  . 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  . Lumbar disc disease 08/28/2016  . 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 Maxillary sinusitis, acute 06/09/2015  . Left acute otitis media 06/09/2015  . Knee pain, left 03/22/2015  . Conjunctivitis 03/27/2014  . Ear pain 03/27/2014  . Dyspepsia and other specified disorders of function of stomach 06/02/2013  . Anxiety state, unspecified 06/02/2013  . Severe obesity (BMI >= 40) ([place]) 12/24/2012  . VITAMIN B12 DEFICIENCY 10/16/2008  . DEPRESSION 09/04/2006  . GERD 09/04/2006  . COLPOSCOPY, HX OF 09/04/2006    Scot Jun, PTA 10/30/2019, 10:56 AM  [place], [place], [address] Phone: [phone]   Fax:  [phone]  Name: [name] MRN: [id number] Date of Birth: 1975-08-07

## 2019-11-06 ENCOUNTER — Ambulatory Visit: Payer: BC Managed Care – PPO | Admitting: Family Medicine

## 2019-11-06 ENCOUNTER — Other Ambulatory Visit: Payer: Self-pay

## 2019-11-06 ENCOUNTER — Ambulatory Visit: Payer: Self-pay

## 2019-11-06 VITALS — BP 137/87 | Ht 63.0 in | Wt 150.0 lb

## 2019-11-06 DIAGNOSIS — M25551 Pain in right hip: Secondary | ICD-10-CM

## 2019-11-06 MED ORDER — METHYLPREDNISOLONE ACETATE 40 MG/ML IJ SUSP
40.0000 mg | Freq: Once | INTRAMUSCULAR | Status: AC
  Administered 2019-11-06: 40 mg via INTRA_ARTICULAR

## 2019-11-06 NOTE — Progress Notes (Signed)
[name] - 44 y.o. female MRN [id number]  Date of birth: 10/17/1975  SUBJECTIVE:  Including CC & ROS.  No chief complaint on file.   [name] is a 44 y.o. female that is presenting with right-sided hip and leg pain.  She initially felt like the pain was rating down the lateral aspect of the leg.  Since that time the pain is more concentrated over the lateral right hip as well as the lateral right knee..  Independent review of the lumbar spine x-ray from 5/27 shows facet arthritis of lumbar spine.  She has some levocurvature of the spine.   Review of Systems See HPI   HISTORY: Past Medical, Surgical, Social, and Family History Reviewed & Updated per EMR.   Pertinent Historical Findings include:  Past Medical History:  Diagnosis Date  . Anxiety   . Asthma   . Chronic kidney disease   . Depression   . Gallstones   . GERD (gastroesophageal reflux disease)   . RAD (reactive airway disease)     Past Surgical History:  Procedure Laterality Date  . CESAREAN SECTION    . CHOLECYSTECTOMY  2007  . 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 Breast cancer Mother   . Hypertension Mother   . Skin cancer Mother   . Pancreatic cancer Maternal Grandfather   . Arthritis Paternal Grandmother   . BRCA 1/2 Paternal Grandmother   . Hypertension Father   . Skin cancer Father   . BRCA 1/2 Cousin   . Diabetes Maternal Grandmother     Social History   Socioeconomic History  . Marital status: Married    Spouse name: Not on file  . Number of children: 2  . Years of education: Not on file  . Highest education level: Not on file  Occupational History  . Occupation: Games developer: [employer]  Tobacco Use  . Smoking status: Never Smoker  . Smokeless tobacco:  Never Used  Substance and Sexual Activity  . Alcohol use: Yes    Comment: rare  . Drug use: No  . Sexual activity: Yes  Other Topics Concern  . Not on file  Social History Narrative  . Not on file   Social Determinants of Health   Financial Resource Strain:   . Difficulty of Paying Living Expenses:   Food Insecurity:   . Worried About Charity fundraiser in the Last Year:   . Arboriculturist in the Last Year:   Transportation Needs:   . Film/video editor (Medical):   Marland Kitchen Lack of Transportation (Non-Medical):   Physical Activity:   . Days of Exercise per Week:   . Minutes of Exercise per Session:   Stress:   . Feeling of Stress :   Social Connections:   . Frequency of Communication with Friends and Family:   . Frequency of Social Gatherings with Friends and Family:   . Attends Religious Services:   . Active Member of Clubs or Organizations:   . Attends Archivist Meetings:   Marland Kitchen Marital Status:   Intimate Partner Violence:   . Fear of Current or Ex-Partner:   . Emotionally Abused:   Marland Kitchen Physically Abused:   . Sexually Abused:      PHYSICAL  EXAM:  VS: BP 137/87   Ht _0  (1.6 m)   Wt 150 lb (68 kg)   BMI 26.57 kg/m  Physical Exam Gen: NAD, alert, cooperative with exam, well-appearing MSK:  Back: Mild instability with hip flexion and abduction. Right leg: Tenderness palpation of the greater trochanter. Positive Noble's test. Some weakness with hip abduction. Normal internal and external rotation of the hip. Neurovascularly intact   Aspiration/Injection Procedure Note HILDEGARD HLAVAC 1975/11/30  Procedure: Injection Indications: Right hip pain  Procedure Details Consent: Risks of procedure as well as the alternatives and risks of each were explained to the (patient/caregiver).  Consent for procedure obtained. Time Out: Verified patient identification, verified procedure, site/side was marked, verified correct patient position, special  equipment/implants available, medications/allergies/relevent history reviewed, required imaging and test results available.  Performed.  The area was cleaned with iodine and alcohol swabs.    The right greater trochanteric bursa was injected using 1 cc's of 40 mg Kenalog and 4 cc's of 0.25% bupivacaine with a 22 3 1/2" needle.  Ultrasound was used. Images were obtained in short views showing the injection.     A sterile dressing was applied.  Patient did tolerate procedure well.     ASSESSMENT & PLAN:   Greater trochanteric pain syndrome of right lower extremity Pain initially may have started as more radicular in nature.  Seems more hip abduction weakness and IT band syndrome that is likely secondary.  Will focus more on strengthening. -Counseled on home exercise therapy and supportive care. -Injection. -If no improvement could consider gabapentin if more radicular symptoms are the nature of her pain.

## 2019-11-06 NOTE — Patient Instructions (Signed)
Good to see you Please try the exercises  Please try ice   Please send me a message in MyChart with any questions or updates.  Please see me back in 4 weeks.   --Dr. Tenlee Wollin  

## 2019-11-07 DIAGNOSIS — M25551 Pain in right hip: Secondary | ICD-10-CM | POA: Insufficient documentation

## 2019-11-07 NOTE — Assessment & Plan Note (Signed)
Pain initially may have started as more radicular in nature.  Seems more hip abduction weakness and IT band syndrome that is likely secondary.  Will focus more on strengthening. -Counseled on home exercise therapy and supportive care. -Injection. -If no improvement could consider gabapentin if more radicular symptoms are the nature of her pain.

## 2019-11-20 ENCOUNTER — Ambulatory Visit: Payer: BC Managed Care – PPO | Admitting: Family Medicine

## 2019-11-28 ENCOUNTER — Other Ambulatory Visit: Payer: Self-pay | Admitting: Family Medicine

## 2019-11-28 DIAGNOSIS — M7918 Myalgia, other site: Secondary | ICD-10-CM

## 2019-12-11 ENCOUNTER — Other Ambulatory Visit: Payer: Self-pay

## 2019-12-11 ENCOUNTER — Encounter: Payer: Self-pay | Admitting: Family Medicine

## 2019-12-11 ENCOUNTER — Ambulatory Visit: Payer: BC Managed Care – PPO | Admitting: Family Medicine

## 2019-12-11 DIAGNOSIS — G5701 Lesion of sciatic nerve, right lower limb: Secondary | ICD-10-CM | POA: Diagnosis not present

## 2019-12-11 DIAGNOSIS — M25551 Pain in right hip: Secondary | ICD-10-CM

## 2019-12-11 MED ORDER — GABAPENTIN 100 MG PO CAPS
100.0000 mg | ORAL_CAPSULE | Freq: Three times a day (TID) | ORAL | 3 refills | Status: DC | PRN
Start: 2019-12-11 — End: 2020-12-02

## 2019-12-11 NOTE — Progress Notes (Signed)
[name] - 44 y.o. female MRN [id number]  Date of birth: 12-31-1975  SUBJECTIVE:  Including CC & ROS.  Chief Complaint  Patient presents with  . Follow-up    right hip / leg    [name] is a 44 y.o. female that is presenting with ongoing right hip and right leg pain.  She received a greater trochanteric injection and had little improvement of her pain.  She feels the pain laterally and posteriorly.  She also has sensation down the lateral aspect of her lower leg.  Pain is controlled with her current workout regimen.  Does seem to be worse with running so she has stopped that.   Review of Systems See HPI   HISTORY: Past Medical, Surgical, Social, and Family History Reviewed & Updated per EMR.   Pertinent Historical Findings include:  Past Medical History:  Diagnosis Date  . Anxiety   . Asthma   . Chronic kidney disease   . Depression   . Gallstones   . GERD (gastroesophageal reflux disease)   . RAD (reactive airway disease)     Past Surgical History:  Procedure Laterality Date  . CESAREAN SECTION    . CHOLECYSTECTOMY  2007  . 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 Breast cancer Mother   . Hypertension Mother   . Skin cancer Mother   . Pancreatic cancer Maternal Grandfather   . Arthritis Paternal Grandmother   . BRCA 1/2 Paternal Grandmother   . Hypertension Father   . Skin cancer Father   . BRCA 1/2 Cousin   . Diabetes Maternal Grandmother     Social History   Socioeconomic History  . Marital status: Married    Spouse name: Not on file  . Number of children: 2  . Years of education: Not on file  . Highest education level: Not on file  Occupational History  . Occupation: Games developer: [employer]  Tobacco Use  . Smoking  status: Never Smoker  . Smokeless tobacco: Never Used  Substance and Sexual Activity  . Alcohol use: Yes    Comment: rare  . Drug use: No  . Sexual activity: Yes  Other Topics Concern  . Not on file  Social History Narrative  . Not on file   Social Determinants of Health   Financial Resource Strain:   . Difficulty of Paying Living Expenses:   Food Insecurity:   . Worried About Charity fundraiser in the Last Year:   . Arboriculturist in the Last Year:   Transportation Needs:   . Film/video editor (Medical):   Marland Kitchen Lack of Transportation (Non-Medical):   Physical Activity:   . Days of Exercise per Week:   . Minutes of Exercise per Session:   Stress:   . Feeling of Stress :   Social Connections:   . Frequency of Communication with Friends and Family:   . Frequency of Social Gatherings with Friends and Family:   . Attends Religious Services:   . Active Member of Clubs or Organizations:   . Attends Archivist Meetings:   Marland Kitchen Marital Status:   Intimate Partner Violence:   . Fear of Current or Ex-Partner:   . Emotionally Abused:   Marland Kitchen Physically Abused:   .  Sexually Abused:      PHYSICAL EXAM:  VS: BP 121/83   Pulse 78   Ht '5\' 4"'$  (1.626 m)   Wt 150 lb (68 kg)   BMI 25.75 kg/m  Physical Exam Gen: NAD, alert, cooperative with exam, well-appearing   ASSESSMENT & PLAN:   Piriformis syndrome of right side Symptoms ongoing on the right leg.  May have component of piriformis.  She does have facet changes in the lumbar spine and has curvature at the base of the spine. Reports a history of hip dysplasia and being in a cast when she was young. Unclear this is is more lumbar or piriformis.  - counseled on home exercise therapy and supportive care - gabapentin  - could consider pelvic floor PT   Greater trochanteric pain syndrome of right lower extremity No improvement with greater trochanteric injection.  She may have more pain related to the joint itself.  She  reports a history of hip dysplasia and was in traction and casting when she was younger.  No significant arthritis of the joint on the right on previous CT from 2009. -Counseled on exercise therapy and supportive care. -Could consider hip joint injection.  She may have degenerative labral issues of the joint.

## 2019-12-11 NOTE — Assessment & Plan Note (Signed)
No improvement with greater trochanteric injection.  She may have more pain related to the joint itself.  She reports a history of hip dysplasia and was in traction and casting when she was younger.  No significant arthritis of the joint on the right on previous CT from 2009. -Counseled on exercise therapy and supportive care. -Could consider hip joint injection.  She may have degenerative labral issues of the joint.

## 2019-12-11 NOTE — Assessment & Plan Note (Signed)
Symptoms ongoing on the right leg.  May have component of piriformis.  She does have facet changes in the lumbar spine and has curvature at the base of the spine. Reports a history of hip dysplasia and being in a cast when she was young. Unclear this is is more lumbar or piriformis.  - counseled on home exercise therapy and supportive care - gabapentin  - could consider pelvic floor PT

## 2019-12-11 NOTE — Patient Instructions (Signed)
Good to see you Please try the exercises  Please try the gabapentin. Please start with one pill at night. This can make you sleepy. You can increase to 2 or 3 pills daily as you tolerate.   Please send me a message in MyChart with any questions or updates.  Please see me back in 4-6 weeks.   --Dr. Jordan Likes

## 2019-12-20 ENCOUNTER — Encounter: Payer: Self-pay | Admitting: Family Medicine

## 2019-12-30 ENCOUNTER — Other Ambulatory Visit: Payer: Self-pay | Admitting: Family Medicine

## 2019-12-30 DIAGNOSIS — M5416 Radiculopathy, lumbar region: Secondary | ICD-10-CM

## 2019-12-30 NOTE — Progress Notes (Signed)
She is having worsening of her leg pain and sensational changes.  We will proceed with MRI lumbar spine to evaluate for nerve impingement.  Myra Rude, MD [facility] 12/30/2019, 8:21 AM

## 2020-01-01 ENCOUNTER — Other Ambulatory Visit: Payer: Self-pay | Admitting: Family Medicine

## 2020-01-01 MED ORDER — HYDROCODONE-ACETAMINOPHEN 5-325 MG PO TABS: 1.0000 | ORAL_TABLET | Freq: Three times a day (TID) | ORAL | 0 refills | Status: DC | PRN

## 2020-01-01 NOTE — Progress Notes (Signed)
Provided norco for her ongoing pain. Unable to tolerate NSAIDS.   Myra Rude, MD [facility] 01/01/2020, 4:51 PM

## 2020-01-03 ENCOUNTER — Other Ambulatory Visit: Payer: Self-pay

## 2020-01-03 ENCOUNTER — Ambulatory Visit
Admission: RE | Admit: 2020-01-03 | Discharge: 2020-01-03 | Disposition: A | Discharge status: Home / Self Care | Payer: BC Managed Care – PPO | Source: Ambulatory Visit | Attending: Family Medicine | Admitting: Family Medicine

## 2020-01-03 DIAGNOSIS — M5416 Radiculopathy, lumbar region: Secondary | ICD-10-CM

## 2020-01-05 ENCOUNTER — Telehealth (INDEPENDENT_AMBULATORY_CARE_PROVIDER_SITE_OTHER): Payer: BC Managed Care – PPO | Admitting: Family Medicine

## 2020-01-05 ENCOUNTER — Other Ambulatory Visit: Payer: Self-pay

## 2020-01-05 DIAGNOSIS — M5416 Radiculopathy, lumbar region: Secondary | ICD-10-CM | POA: Insufficient documentation

## 2020-01-05 NOTE — Assessment & Plan Note (Signed)
She continues to have pain and MRI was revealing for a large extrusion that would contribute to a right-sided radiculopathy. -Counseled supportive care. -Epidural -Counseled on compression.

## 2020-01-05 NOTE — Progress Notes (Signed)
Virtual Visit via Video Note  I connected with [name] on 01/05/20 at  2:10 PM EDT by a video enabled telemedicine application and verified that I am speaking with the correct person using two identifiers.   I discussed the limitations of evaluation and management by telemedicine and the availability of in person appointments. The patient expressed understanding and agreed to proceed.  Patient: work  Physician: office   History of Present Illness:  Ms. [name] is a 44 year old female is following up after the MRI of her lumbar spine.  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  She continues to have pain.   Observations/Objective:  Gen: NAD, alert, cooperative with exam, well-appearing  Assessment and Plan:  Lumbar radiculopathy: She continues to have pain and MRI was revealing for a large extrusion that would contribute to a right-sided radiculopathy. -Counseled supportive care. -Epidural -Counseled on compression.  Follow Up Instructions:    I discussed the assessment and treatment plan with the patient. The patient was provided an opportunity to ask questions and all were answered. The patient agreed with the plan and demonstrated an understanding of the instructions.   The patient was advised to call back or seek an in-person evaluation if the symptoms worsen or if the condition fails to improve as anticipated.   Sabrina Gandy, MD

## 2020-01-07 ENCOUNTER — Telehealth: Payer: Self-pay | Admitting: Family Medicine

## 2020-01-07 NOTE — Telephone Encounter (Signed)
Patient called state she hasn't heard any update regarding Appt for epidural discussed w/ provider.  --Forwarding message to med asst for review w/provider --- pt wishes to be w/ update/status @ [phone].   --Fausto Skillern

## 2020-01-09 ENCOUNTER — Ambulatory Visit
Admission: RE | Admit: 2020-01-09 | Discharge: 2020-01-09 | Disposition: A | Discharge status: Home / Self Care | Payer: BC Managed Care – PPO | Source: Ambulatory Visit | Attending: Family Medicine | Admitting: Family Medicine

## 2020-01-09 ENCOUNTER — Other Ambulatory Visit: Payer: Self-pay

## 2020-01-09 ENCOUNTER — Other Ambulatory Visit: Payer: Self-pay | Admitting: Family Medicine

## 2020-01-09 DIAGNOSIS — M5416 Radiculopathy, lumbar region: Secondary | ICD-10-CM

## 2020-01-09 MED ORDER — IOPAMIDOL (ISOVUE-M 200) INJECTION 41%
1.0000 mL | Freq: Once | INTRAMUSCULAR | Status: AC
  Administered 2020-01-09: 1 mL via EPIDURAL

## 2020-01-09 MED ORDER — METHYLPREDNISOLONE ACETATE 40 MG/ML INJ SUSP (RADIOLOG
120.0000 mg | Freq: Once | INTRAMUSCULAR | Status: AC
  Administered 2020-01-09: 120 mg via EPIDURAL

## 2020-01-09 NOTE — Discharge Instructions (Signed)

## 2020-01-10 ENCOUNTER — Other Ambulatory Visit: Payer: Self-pay | Admitting: Family Medicine

## 2020-01-10 DIAGNOSIS — M7918 Myalgia, other site: Secondary | ICD-10-CM

## 2020-01-15 ENCOUNTER — Encounter: Payer: Self-pay | Admitting: Family Medicine

## 2020-01-18 ENCOUNTER — Other Ambulatory Visit: Payer: BC Managed Care – PPO

## 2020-01-26 ENCOUNTER — Other Ambulatory Visit: Payer: Self-pay | Admitting: Family Medicine

## 2020-01-26 DIAGNOSIS — M5416 Radiculopathy, lumbar region: Secondary | ICD-10-CM

## 2020-01-30 ENCOUNTER — Other Ambulatory Visit: Payer: Self-pay | Admitting: Family Medicine

## 2020-01-30 ENCOUNTER — Ambulatory Visit
Admission: RE | Admit: 2020-01-30 | Discharge: 2020-01-30 | Disposition: A | Discharge status: Home / Self Care | Payer: BC Managed Care – PPO | Source: Ambulatory Visit | Attending: Family Medicine | Admitting: Family Medicine

## 2020-01-30 ENCOUNTER — Other Ambulatory Visit: Payer: Self-pay

## 2020-01-30 DIAGNOSIS — M5416 Radiculopathy, lumbar region: Secondary | ICD-10-CM

## 2020-01-30 MED ORDER — IOPAMIDOL (ISOVUE-M 200) INJECTION 41%
1.0000 mL | Freq: Once | INTRAMUSCULAR | Status: AC
  Administered 2020-01-30: 1 mL via EPIDURAL

## 2020-01-30 MED ORDER — METHYLPREDNISOLONE ACETATE 40 MG/ML INJ SUSP (RADIOLOG: 120.0000 mg | Freq: Once | INTRAMUSCULAR | Status: AC

## 2020-01-30 MED ORDER — METHYLPREDNISOLONE ACETATE 40 MG/ML INJ SUSP (RADIOLOG
120.0000 mg | Freq: Once | INTRAMUSCULAR | Status: AC
  Administered 2020-01-30: 120 mg via EPIDURAL

## 2020-01-30 MED ORDER — IOPAMIDOL (ISOVUE-M 200) INJECTION 41%: 1.0000 mL | Freq: Once | INTRAMUSCULAR | Status: DC

## 2020-01-30 NOTE — Discharge Instructions (Signed)

## 2020-02-28 ENCOUNTER — Other Ambulatory Visit: Payer: Self-pay | Admitting: Family Medicine

## 2020-02-28 DIAGNOSIS — K219 Gastro-esophageal reflux disease without esophagitis: Secondary | ICD-10-CM

## 2020-03-25 ENCOUNTER — Encounter (HOSPITAL_COMMUNITY): Payer: Self-pay

## 2020-05-05 LAB — HM MAMMOGRAPHY: HM Mammogram: NORMAL

## 2020-08-06 ENCOUNTER — Other Ambulatory Visit: Payer: Self-pay | Admitting: Family Medicine

## 2020-08-06 DIAGNOSIS — K219 Gastro-esophageal reflux disease without esophagitis: Secondary | ICD-10-CM

## 2020-12-02 ENCOUNTER — Encounter: Payer: Self-pay | Admitting: Family Medicine

## 2020-12-02 ENCOUNTER — Ambulatory Visit (INDEPENDENT_AMBULATORY_CARE_PROVIDER_SITE_OTHER): Payer: BC Managed Care – PPO | Admitting: Family Medicine

## 2020-12-02 ENCOUNTER — Telehealth: Payer: Self-pay

## 2020-12-02 ENCOUNTER — Other Ambulatory Visit: Payer: Self-pay

## 2020-12-02 VITALS — BP 122/69 | HR 83 | Temp 98.6°F | Resp 18 | Ht 64.0 in | Wt 177.4 lb

## 2020-12-02 DIAGNOSIS — E669 Obesity, unspecified: Secondary | ICD-10-CM

## 2020-12-02 DIAGNOSIS — Z1159 Encounter for screening for other viral diseases: Secondary | ICD-10-CM | POA: Diagnosis not present

## 2020-12-02 DIAGNOSIS — R5383 Other fatigue: Secondary | ICD-10-CM | POA: Diagnosis not present

## 2020-12-02 DIAGNOSIS — F321 Major depressive disorder, single episode, moderate: Secondary | ICD-10-CM

## 2020-12-02 DIAGNOSIS — Z0000 Encounter for general adult medical examination without abnormal findings: Secondary | ICD-10-CM | POA: Diagnosis not present

## 2020-12-02 DIAGNOSIS — Z1211 Encounter for screening for malignant neoplasm of colon: Secondary | ICD-10-CM

## 2020-12-02 LAB — CBC WITH DIFFERENTIAL/PLATELET
Basophils Absolute: 0.1 10*3/uL (ref 0.0–0.1)
Basophils Relative: 1.2 % (ref 0.0–3.0)
Eosinophils Absolute: 0.1 10*3/uL (ref 0.0–0.7)
Eosinophils Relative: 2.5 % (ref 0.0–5.0)
HCT: 39.9 % (ref 36.0–46.0)
Hemoglobin: 13.4 g/dL (ref 12.0–15.0)
Lymphocytes Relative: 28.5 % (ref 12.0–46.0)
Lymphs Abs: 1.4 10*3/uL (ref 0.7–4.0)
MCHC: 33.7 g/dL (ref 30.0–36.0)
MCV: 95 fl (ref 78.0–100.0)
Monocytes Absolute: 0.4 10*3/uL (ref 0.1–1.0)
Monocytes Relative: 8.7 % (ref 3.0–12.0)
Neutro Abs: 2.9 10*3/uL (ref 1.4–7.7)
Neutrophils Relative %: 59.1 % (ref 43.0–77.0)
Platelets: 294 10*3/uL (ref 150.0–400.0)
RBC: 4.2 Mil/uL (ref 3.87–5.11)
RDW: 12.5 % (ref 11.5–15.5)
WBC: 4.9 10*3/uL (ref 4.0–10.5)

## 2020-12-02 LAB — LIPID PANEL
Cholesterol: 173 mg/dL (ref 0–200)
HDL: 70.8 mg/dL (ref >39.00)
LDL Cholesterol: 88 mg/dL (ref 0–99)
NonHDL: 101.99
Total CHOL/HDL Ratio: 2
Triglycerides: 72 mg/dL (ref 0.0–149.0)
VLDL: 14.4 mg/dL (ref 0.0–40.0)

## 2020-12-02 LAB — VITAMIN D 25 HYDROXY (VIT D DEFICIENCY, FRACTURES): VITD: 61.31 ng/mL (ref 30.00–100.00)

## 2020-12-02 LAB — COMPREHENSIVE METABOLIC PANEL
ALT: 24 U/L (ref 0–35)
AST: 27 U/L (ref 0–37)
Albumin: 4.5 g/dL (ref 3.5–5.2)
Alkaline Phosphatase: 49 U/L (ref 39–117)
BUN: 19 mg/dL (ref 6–23)
CO2: 29 mEq/L (ref 19–32)
Calcium: 9.8 mg/dL (ref 8.4–10.5)
Chloride: 101 mEq/L (ref 96–112)
Creatinine, Ser: 1 mg/dL (ref 0.40–1.20)
GFR: 67.08 mL/min (ref >60.00)
Glucose, Bld: 87 mg/dL (ref 70–99)
Potassium: 4.3 mEq/L (ref 3.5–5.1)
Sodium: 139 mEq/L (ref 135–145)
Total Bilirubin: 0.4 mg/dL (ref 0.2–1.2)
Total Protein: 6.7 g/dL (ref 6.0–8.3)

## 2020-12-02 LAB — TSH: TSH: 2.84 u[IU]/mL (ref 0.35–5.50)

## 2020-12-02 LAB — VITAMIN B12: Vitamin B-12: 915 pg/mL — ABNORMAL HIGH (ref 211–911)

## 2020-12-02 MED ORDER — SAXENDA 18 MG/3ML ~~LOC~~ SOPN: 3.0000 mg | PEN_INJECTOR | Freq: Every day | SUBCUTANEOUS | 3 refills | Status: DC

## 2020-12-02 MED ORDER — INSULIN PEN NEEDLE 31G X 5 MM MISC: 1.0000 "application " | Freq: Every day | 1 refills | Status: DC

## 2020-12-02 NOTE — Telephone Encounter (Signed)
PA started:  [name] (Key: [id number]) Rx #: [id number] Saxenda 18MG pen-injectors   Form [employer] Electronic PA Form [phone] N[facility])

## 2020-12-02 NOTE — Progress Notes (Signed)
Subjective:   By signing my name below, I, Shehryar Baig, attest that this documentation has been prepared under the direction and in the presence of Dr. Roma Schanz, DO. 12/02/2020   Patient ID: [name], female    DOB: 10/21/1975, 45 y.o.   MRN: [id number]  Chief Complaint  Patient presents with   Annual Exam    Concerns/ questions: Fatigue, tx tp help loose about 15lbs Pap: [facility] HIV/ Hep C screens due    HPI Patient is in today for a physical exam. She reports feeling more tired lately. She has not gotten Covid-19 before.  She reports gaining 20 lb's after she stopped exercising due to breaking her back. She has started exercising recently by walking, and kayaking. She plans to start running as well. She is also trying to manage a healthy diet. She is interested in trying medication to help with weight loss.  She is eating more due to her hypoactive glycemia. She also notes her sugar levels after increased.  She denies having any fever, ear pain, congestion, sinus pain, sore throat, eye pain, chest pain, palpations, cough, SOB, wheezing, n/v/d, constipation, blood in stool, dysuria, frequency, hematuria, or headaches at this time. She has 3 Covid-19 vaccines at this time.  There have been recent changes in her family history. She has had no recent surgeries.  She sees a GYN but is looking to change providers. She is due for a colonoscopy at this time and is willing to set an appointment.  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   Procedure: LAPAROSCOPIC ROUX-EN-Y GASTRIC BYPASS WITH UPPER ENDOSCOPY;  Surgeon: Greer Pickerel, MD;  Location: [facility];  Service: General;   Laterality: N/A;    Family History  Problem Relation Age of Onset   Breast cancer Mother    Hypertension Mother    Skin cancer Mother    Pancreatic cancer Maternal Grandfather    Arthritis Paternal Grandmother    BRCA 1/2 Paternal Grandmother    Hypertension Father    Skin cancer Father    BRCA 1/2 Cousin    Diabetes Maternal Grandmother     Social History   Socioeconomic History   Marital status: Married    Spouse name: Not on file   Number of children: 2   Years of education: Not on file   Highest education level: Not on file  Occupational History   Occupation: Games developer: [employer]  Tobacco Use   Smoking status: Never   Smokeless tobacco: Never  Substance and Sexual Activity   Alcohol use: Yes    Comment: rare   Drug use: No   Sexual activity: Yes  Other Topics Concern   Not on file  Social History Narrative   Exercising 4-5 days a week    Social Determinants of Health   Financial Resource Strain: Not on file  Food Insecurity: Not on file  Transportation Needs: Not on file  Physical Activity: Not on file  Stress: Not on file  Social Connections: Not on file  Intimate Partner Violence: Not on file    Outpatient Medications Prior  to Visit  Medication Sig Dispense Refill   albuterol (PROAIR HFA) 108 (90 BASE) MCG/ACT inhaler INHALE 2 PUFFS FOUR TIMES DAILY AS NEEDED (Patient taking differently: Inhale 1-2 puffs into the lungs every 6 (six) hours as needed for wheezing or shortness of breath.) 8.5 g 1   Doxepin HCl 6 MG TABS Take 1 tablet by mouth at bedtime.     fluticasone (FLONASE) 50 MCG/ACT nasal spray Place 2 sprays into both nostrils at bedtime. 48 g 0   hydrocortisone (ANUSOL-HC) 2.5 % rectal cream PLACE 1 APPLICATION RECTALLY 2 TIMES DAILY 30 g 0   hydrocortisone cream 0.5 % Apply 1 application topically 2 (two) times daily as needed (for ezcema/itchy skin on elbow).     levonorgestrel (MIRENA) 20 MCG/24HR IUD 1 each by  Intrauterine route once.     NONFORMULARY OR COMPOUNDED ITEM Bariatric multivitamin 1 po qd 90 each 3   omeprazole (PRILOSEC) 40 MG capsule TAKE 1 CAPSULE BY MOUTH EVERY DAY 90 capsule 1   Probiotic Product (PROBIOTIC PO) Take by mouth.     tretinoin (RETIN-A) 0.05 % cream APPLY TO FACE ONCE DAILY  AT BEDTIME  3   triamcinolone cream (KENALOG) 0.1 %      triamcinolone cream (KENALOG) 0.1 % Apply 1 application topically 2 (two) times daily. 30 g 0   busPIRone (BUSPAR) 10 MG tablet Take 10 mg by mouth 2 (two) times daily. (Patient not taking: Reported on 12/02/2020)     TRINTELLIX 20 MG TABS tablet Take 20 mg by mouth daily.     cyclobenzaprine (FLEXERIL) 10 MG tablet TAKE 1/2 TO 1 TABLETS (5-10 MG TOTAL) BY MOUTH 3 (THREE) TIMES DAILY AS NEEDED FOR MUSCLE SPASMS. (Patient not taking: Reported on 12/02/2020) 21 tablet 0   escitalopram (LEXAPRO) 20 MG tablet Take 20 mg by mouth daily. (Patient not taking: Reported on 12/02/2020)     gabapentin (NEURONTIN) 100 MG capsule Take 1 capsule (100 mg total) by mouth 3 (three) times daily as needed. (Patient not taking: Reported on 12/02/2020) 30 capsule 3   HYDROcodone-acetaminophen (NORCO/VICODIN) 5-325 MG tablet Take 1 tablet by mouth every 8 (eight) hours as needed. (Patient not taking: Reported on 12/02/2020) 15 tablet 0   No facility-administered medications prior to visit.    Allergies  Allergen Reactions   Amoxicillin Hives    Has patient had a PCN reaction causing immediate rash, facial/tongue/throat swelling, SOB or lightheadedness with hypotension:No Has patient had a PCN reaction causing severe rash involving mucus membranes or skin necrosis:unsure--doesn't think so Has patient had a PCN reaction that required hospitalization:No Has patient had a PCN reaction occurring within the last 10 years:No If all of the above answers are "NO", then may proceed with Cephalosporin use.    Labetalol Hcl Other (See Comments)    Asthma   Sulfonamide  Derivatives Hives    Kidney Disease    Review of Systems  Constitutional:  Positive for malaise/fatigue. Negative for fever.  HENT:  Negative for congestion, ear pain, sinus pain and sore throat.   Eyes:  Negative for blurred vision and pain.  Respiratory:  Negative for cough, shortness of breath and wheezing.   Cardiovascular:  Negative for chest pain, palpitations and leg swelling.  Gastrointestinal:  Negative for blood in stool, constipation, diarrhea, nausea and vomiting.  Genitourinary:  Negative for dysuria, frequency and hematuria.  Musculoskeletal:  Negative for back pain.  Skin:  Negative for rash.  Neurological:  Negative for loss of consciousness and headaches.  Objective:    Physical Exam Vitals and nursing note reviewed.  Constitutional:      General: She is not in acute distress.    Appearance: Normal appearance. She is not ill-appearing.  HENT:     Head: Normocephalic and atraumatic.     Right Ear: Tympanic membrane, ear canal and external ear normal.     Left Ear: Tympanic membrane, ear canal and external ear normal.  Eyes:     Extraocular Movements: Extraocular movements intact.     Pupils: Pupils are equal, round, and reactive to light.  Cardiovascular:     Rate and Rhythm: Normal rate and regular rhythm.     Pulses: Normal pulses.     Heart sounds: Normal heart sounds. No murmur heard. Pulmonary:     Effort: Pulmonary effort is normal. No respiratory distress.     Breath sounds: Normal breath sounds. No wheezing or rales.  Abdominal:     General: Bowel sounds are normal. There is no distension.     Palpations: Abdomen is soft.     Tenderness: There is no abdominal tenderness. There is no guarding or rebound.  Skin:    General: Skin is warm and dry.  Neurological:     Mental Status: She is alert and oriented to person, place, and time.  Psychiatric:        Behavior: Behavior normal.        Judgment: Judgment normal.    BP 122/69 (BP Location:  Left Arm, Patient Position: Sitting, Cuff Size: Normal)   Pulse 83   Temp 98.6 F (37 C) (Oral)   Resp 18   Ht _0  (1.626 m)   Wt 177 lb 6.4 oz (80.5 kg)   SpO2 100%   BMI 30.45 kg/m  Wt Readings from Last 3 Encounters:  12/02/20 177 lb 6.4 oz (80.5 kg)  12/11/19 150 lb (68 kg)  11/06/19 150 lb (68 kg)    Diabetic Foot Exam - Simple   No data filed    Lab Results  Component Value Date   WBC 4.6 10/09/2019   HGB 13.4 10/09/2019   HCT 39.1 10/09/2019   PLT 284.0 10/09/2019   GLUCOSE 86 10/09/2019   CHOL 185 10/09/2019   TRIG 77.0 10/09/2019   HDL 77.10 10/09/2019   LDLDIRECT 154.3 07/28/2010   LDLCALC 93 10/09/2019   ALT 32 10/09/2019   AST 29 10/09/2019   NA 136 10/09/2019   K 4.2 10/09/2019   CL 99 10/09/2019   CREATININE 0.86 10/09/2019   BUN 13 10/09/2019   CO2 33 (H) 10/09/2019   TSH 2.47 10/09/2019   HGBA1C 5.2 10/09/2019    Lab Results  Component Value Date   TSH 2.47 10/09/2019   Lab Results  Component Value Date   WBC 4.6 10/09/2019   HGB 13.4 10/09/2019   HCT 39.1 10/09/2019   MCV 96.1 10/09/2019   PLT 284.0 10/09/2019   Lab Results  Component Value Date   NA 136 10/09/2019   K 4.2 10/09/2019   CO2 33 (H) 10/09/2019   GLUCOSE 86 10/09/2019   BUN 13 10/09/2019   CREATININE 0.86 10/09/2019   BILITOT 0.5 10/09/2019   ALKPHOS 49 10/09/2019   AST 29 10/09/2019   ALT 32 10/09/2019   PROT 6.1 10/09/2019   ALBUMIN 4.1 10/09/2019   CALCIUM 9.1 10/09/2019   ANIONGAP 11 08/08/2017   GFR 70.92 10/09/2019   Lab Results  Component Value Date   CHOL 185 10/09/2019   Lab  Results  Component Value Date   HDL 77.10 10/09/2019   Lab Results  Component Value Date   LDLCALC 93 10/09/2019   Lab Results  Component Value Date   TRIG 77.0 10/09/2019   Lab Results  Component Value Date   CHOLHDL 2 10/09/2019   Lab Results  Component Value Date   HGBA1C 5.2 10/09/2019   Mammogram: Last completed on 05/05/2020. Results were normal. Repeat  in 1 year.  Pap Smear: Last completed 11/12/2013. Colonoscopy: Not completed yet    Assessment & Plan:   Problem List Items Addressed This Visit       Unprioritized   Depression, major, single episode, moderate ([place])    Stable con't meds       Relevant Medications   TRINTELLIX 20 MG TABS tablet   Other fatigue   Relevant Orders   CBC with Differential/Platelet   TSH   Vitamin B12   Comprehensive metabolic panel   VITAMIN D 25 Hydroxy (Vit-D Deficiency, Fractures)   Preventative health care - Primary    ghm utd Check labs       Relevant Orders   CBC with Differential/Platelet   Lipid panel   TSH   Vitamin B12   Comprehensive metabolic panel   VITAMIN D 25 Hydroxy (Vit-D Deficiency, Fractures)   Severe obesity (BMI >= 40) (HCC)    D/w pt diet and exercise S/p gastric bypass saxenda per orders ---  Start with 0.6 mg daily x1 weeks and inc by 0.6 weekly until 3 mg        Relevant Medications   Liraglutide -Weight Management (SAXENDA) 18 MG/3ML SOPN   Other Visit Diagnoses     Obesity (BMI 30.0-34.9)       Relevant Medications   Liraglutide -Weight Management (SAXENDA) 18 MG/3ML SOPN   Insulin Pen Needle 31G X 5 MM MISC   Need for hepatitis C screening test       Relevant Orders   Hepatitis C antibody   Colon cancer screening       Relevant Orders   Ambulatory referral to Gastroenterology      Meds ordered this encounter  Medications   Liraglutide -Weight Management (SAXENDA) 18 MG/3ML SOPN    Sig: Inject 3 mg into the skin daily.    Dispense:  15 mL    Refill:  3   Insulin Pen Needle 31G X 5 MM MISC    Sig: 1 application by Does not apply route daily.    Dispense:  100 each    Refill:  1    I, Dr. Roma Schanz, DO., personally preformed the services described in this documentation.  All medical record entries made by the scribe were at my direction and in my presence.  I have reviewed the chart and discharge instructions (if applicable)  and agree that the record reflects my personal performance and is accurate and complete. 12/02/2020   I,Shehryar Baig,acting as a Education administrator for [employer], DO.,have documented all relevant documentation on the behalf of [name], DO,as directed by  [name], DO while in the presence of [name], DO.   [name], DO

## 2020-12-02 NOTE — Assessment & Plan Note (Signed)
Stable con't meds 

## 2020-12-02 NOTE — Assessment & Plan Note (Signed)
D/w pt diet and exercise S/p gastric bypass saxenda per orders ---  Start with 0.6 mg daily x1 weeks and inc by 0.6 weekly until 3 mg

## 2020-12-02 NOTE — Patient Instructions (Signed)
Preventive Care 47-47 Years Old, Female Preventive care refers to lifestyle choices and visits with your health care provider that can promote health and wellness. This includes: A yearly physical exam. This is also called an annual wellness visit. Regular dental and eye exams. Immunizations. Screening for certain conditions. Healthy lifestyle choices, such as: Eating a healthy diet. Getting regular exercise. Not using drugs or products that contain nicotine and tobacco. Limiting alcohol use. What can I expect for my preventive care visit? Physical exam Your health care provider will check your: Height and weight. These may be used to calculate your BMI (body mass index). BMI is a measurement that tells if you are at a healthy weight. Heart rate and blood pressure. Body temperature. Skin for abnormal spots. Counseling Your health care provider may ask you questions about your: Past medical problems. Family's medical history. Alcohol, tobacco, and drug use. Emotional well-being. Home life and relationship well-being. Sexual activity. Diet, exercise, and sleep habits. Work and work Statistician. Access to firearms. Method of birth control. Menstrual cycle. Pregnancy history. What immunizations do I need?  Vaccines are usually given at various ages, according to a schedule. Your health care provider will recommend vaccines for you based on your age, medicalhistory, and lifestyle or other factors, such as travel or where you work. What tests do I need? Blood tests Lipid and cholesterol levels. These may be checked every 5 years, or more often if you are over 47 years old. Hepatitis C test. Hepatitis B test. Screening Lung cancer screening. You may have this screening every year starting at age 47 if you have a 30-pack-year history of smoking and currently smoke or have quit within the past 15 years. Colorectal cancer screening. All adults should have this screening starting at  age 47 and continuing until age 3. Your health care provider may recommend screening at age 47 if you are at increased risk. You will have tests every 1-10 years, depending on your results and the type of screening test. Diabetes screening. This is done by checking your blood sugar (glucose) after you have not eaten for a while (fasting). You may have this done every 1-3 years. Mammogram. This may be done every 1-2 years. Talk with your health care provider about when you should start having regular mammograms. This may depend on whether you have a family history of breast cancer. BRCA-related cancer screening. This may be done if you have a family history of breast, ovarian, tubal, or peritoneal cancers. Pelvic exam and Pap test. This may be done every 3 years starting at age 47. Starting at age 47, this may be done every 5 years if you have a Pap test in combination with an HPV test. Other tests STD (sexually transmitted disease) testing, if you are at risk. Bone density scan. This is done to screen for osteoporosis. You may have this scan if you are at high risk for osteoporosis. Talk with your health care provider about your test results, treatment options,and if necessary, the need for more tests. Follow these instructions at home: Eating and drinking  Eat a diet that includes fresh fruits and vegetables, whole grains, lean protein, and low-fat dairy products. Take vitamin and mineral supplements as recommended by your health care provider. Do not drink alcohol if: Your health care provider tells you not to drink. You are pregnant, may be pregnant, or are planning to become pregnant. If you drink alcohol: Limit how much you have to 0-1 drink a day. Be aware  of how much alcohol is in your drink. In the [place] one drink equals one 12 oz bottle of beer (355 mL), one 5 oz glass of wine (148 mL), or one 1 oz glass of hard liquor (44 mL).  Lifestyle Take daily care of your teeth and  gums. Brush your teeth every morning and night with fluoride toothpaste. Floss one time each day. Stay active. Exercise for at least 30 minutes 5 or more days each week. Do not use any products that contain nicotine or tobacco, such as cigarettes, e-cigarettes, and chewing tobacco. If you need help quitting, ask your health care provider. Do not use drugs. If you are sexually active, practice safe sex. Use a condom or other form of protection to prevent STIs (sexually transmitted infections). If you do not wish to become pregnant, use a form of birth control. If you plan to become pregnant, see your health care provider for a prepregnancy visit. If told by your health care provider, take low-dose aspirin daily starting at age 29. Find healthy ways to cope with stress, such as: Meditation, yoga, or listening to music. Journaling. Talking to a trusted person. Spending time with friends and family. Safety Always wear your seat belt while driving or riding in a vehicle. Do not drive: If you have been drinking alcohol. Do not ride with someone who has been drinking. When you are tired or distracted. While texting. Wear a helmet and other protective equipment during sports activities. If you have firearms in your house, make sure you follow all gun safety procedures. What's next? Visit your health care provider once a year for an annual wellness visit. Ask your health care provider how often you should have your eyes and teeth checked. Stay up to date on all vaccines. This information is not intended to replace advice given to you by your health care provider. Make sure you discuss any questions you have with your healthcare provider. Document Revised: 02/03/2020 Document Reviewed: 01/10/2018 Elsevier Patient Education  2022 [employer].

## 2020-12-02 NOTE — Assessment & Plan Note (Signed)
ghm utd Check labs  

## 2020-12-03 LAB — HEPATITIS C ANTIBODY
Hepatitis C Ab: NONREACTIVE
SIGNAL TO CUT-OFF: 0 (ref <1.00)

## 2020-12-03 NOTE — Telephone Encounter (Signed)
[name] was approved from 12/02/2020 to 04/04/2021

## 2020-12-09 ENCOUNTER — Encounter (HOSPITAL_BASED_OUTPATIENT_CLINIC_OR_DEPARTMENT_OTHER): Payer: Self-pay

## 2020-12-09 ENCOUNTER — Emergency Department (HOSPITAL_BASED_OUTPATIENT_CLINIC_OR_DEPARTMENT_OTHER): Payer: BC Managed Care – PPO

## 2020-12-09 ENCOUNTER — Other Ambulatory Visit: Payer: Self-pay

## 2020-12-09 ENCOUNTER — Emergency Department (HOSPITAL_BASED_OUTPATIENT_CLINIC_OR_DEPARTMENT_OTHER)
Admission: EM | Admit: 2020-12-09 | Discharge: 2020-12-09 | Disposition: A | Discharge status: Home / Self Care | Payer: BC Managed Care – PPO | Attending: Emergency Medicine | Admitting: Emergency Medicine

## 2020-12-09 DIAGNOSIS — N189 Chronic kidney disease, unspecified: Secondary | ICD-10-CM | POA: Insufficient documentation

## 2020-12-09 DIAGNOSIS — R42 Dizziness and giddiness: Secondary | ICD-10-CM

## 2020-12-09 DIAGNOSIS — E860 Dehydration: Secondary | ICD-10-CM

## 2020-12-09 DIAGNOSIS — J45909 Unspecified asthma, uncomplicated: Secondary | ICD-10-CM | POA: Insufficient documentation

## 2020-12-09 DIAGNOSIS — Z7951 Long term (current) use of inhaled steroids: Secondary | ICD-10-CM | POA: Insufficient documentation

## 2020-12-09 DIAGNOSIS — R202 Paresthesia of skin: Secondary | ICD-10-CM | POA: Insufficient documentation

## 2020-12-09 DIAGNOSIS — R0602 Shortness of breath: Secondary | ICD-10-CM | POA: Diagnosis not present

## 2020-12-09 LAB — COMPREHENSIVE METABOLIC PANEL
ALT: 25 U/L (ref 0–44)
AST: 30 U/L (ref 15–41)
Albumin: 4.3 g/dL (ref 3.5–5.0)
Alkaline Phosphatase: 50 U/L (ref 38–126)
Anion gap: 8 (ref 5–15)
BUN: 25 mg/dL — ABNORMAL HIGH (ref 6–20)
CO2: 26 mmol/L (ref 22–32)
Calcium: 8.5 mg/dL — ABNORMAL LOW (ref 8.9–10.3)
Chloride: 96 mmol/L — ABNORMAL LOW (ref 98–111)
Creatinine, Ser: 0.88 mg/dL (ref 0.44–1.00)
GFR, Estimated: >60 mL/min (ref >60)
Glucose, Bld: 103 mg/dL — ABNORMAL HIGH (ref 70–99)
Potassium: 3.7 mmol/L (ref 3.5–5.1)
Sodium: 130 mmol/L — ABNORMAL LOW (ref 135–145)
Total Bilirubin: 0.1 mg/dL — ABNORMAL LOW (ref 0.3–1.2)
Total Protein: 6.7 g/dL (ref 6.5–8.1)

## 2020-12-09 LAB — CBC WITH DIFFERENTIAL/PLATELET
Abs Immature Granulocytes: 0.02 10*3/uL (ref 0.00–0.07)
Basophils Absolute: 0.1 10*3/uL (ref 0.0–0.1)
Basophils Relative: 1 %
Eosinophils Absolute: 0.1 10*3/uL (ref 0.0–0.5)
Eosinophils Relative: 1 %
HCT: 36 % (ref 36.0–46.0)
Hemoglobin: 13.1 g/dL (ref 12.0–15.0)
Immature Granulocytes: 0 %
Lymphocytes Relative: 21 %
Lymphs Abs: 1.6 10*3/uL (ref 0.7–4.0)
MCH: 33.1 pg (ref 26.0–34.0)
MCHC: 36.4 g/dL — ABNORMAL HIGH (ref 30.0–36.0)
MCV: 90.9 fL (ref 80.0–100.0)
Monocytes Absolute: 0.8 10*3/uL (ref 0.1–1.0)
Monocytes Relative: 10 %
Neutro Abs: 5.3 10*3/uL (ref 1.7–7.7)
Neutrophils Relative %: 67 %
Platelets: 299 10*3/uL (ref 150–400)
RBC: 3.96 MIL/uL (ref 3.87–5.11)
RDW: 11.4 % — ABNORMAL LOW (ref 11.5–15.5)
WBC: 7.9 10*3/uL (ref 4.0–10.5)
nRBC: 0 % (ref 0.0–0.2)

## 2020-12-09 LAB — TROPONIN I (HIGH SENSITIVITY): Troponin I (High Sensitivity): <2 ng/L (ref <18)

## 2020-12-09 LAB — CBG MONITORING, ED: Glucose-Capillary: 139 mg/dL — ABNORMAL HIGH (ref 70–99)

## 2020-12-09 MED ORDER — IOHEXOL 350 MG/ML SOLN
100.0000 mL | Freq: Once | INTRAVENOUS | Status: AC | PRN
  Administered 2020-12-09: 100 mL via INTRAVENOUS

## 2020-12-09 MED ORDER — SODIUM CHLORIDE 0.9 % IV BOLUS
1000.0000 mL | Freq: Once | INTRAVENOUS | Status: AC
  Administered 2020-12-09: 1000 mL via INTRAVENOUS

## 2020-12-09 NOTE — ED Notes (Signed)
Patient transported to CT 

## 2020-12-09 NOTE — Discharge Instructions (Addendum)
Your workup was overall reassuring in the ED today. Please follow up with your PCP regarding ED visit. I would recommend avoiding energy drinks in the future. Please increase the amount of water you drink on a daily basis to stay hydrated.   Return to the ED for any new/worsening symptoms

## 2020-12-09 NOTE — ED Provider Notes (Signed)
[facility] EMERGENCY DEPARTMENT Provider Note   CSN: [id number] Arrival date & time: 12/09/20  1652     History Chief Complaint  Patient presents with   Dizziness    [name] is a 46 y.o. female with PMHx anxiety, depression, GERD who presents to the ED today with complaint of sudden onset, constant, dizziness/lightheadedness that began around 2:30 PM today.  Patient also complains of a feeling of generalized weakness.  She also complains of a tingling sensation to her left side of her face and her tongue.  She states that she left work to go home and while going out to the parking deck she began feeling short of breath.  She does report that around 2 PM she drank a energy drink.  She does not typically drink energy drinks and was unsure if this could be the cause.  She states while driving home she began feeling "weird" and called her husband and told him she was coming to the ED for further evaluation.  She denies any chest pain.  The shortness of breath has dissipated.  She states she has slight tingling on the left side of her face still however feels more tight than anything else.  She does mention that she did a hot yoga class last night which she has never done before and is unsure if she was dehydrated today.  She tried to drink plenty of water this morning prior to the energy drink.  Denies headache, blurry vision, unilateral weakness or numbness, speech changes.  Per her husband at bedside she is acting at baseline.  The history is provided by the patient and medical records.      Past Medical History:  Diagnosis Date   Anxiety    Asthma    Chronic kidney disease    Depression    Gallstones    GERD (gastroesophageal reflux disease)    RAD (reactive airway disease)     Patient Active Problem List   Diagnosis Date Noted   Other fatigue 12/02/2020   Lumbar radiculopathy 01/05/2020   Piriformis syndrome of right side 12/11/2019   Greater trochanteric pain  syndrome of right lower extremity 11/07/2019   Right foot pain 07/25/2019   Muscle spasm of shoulder region 04/22/2019   Abdominal pain 10/08/2018   Left leg pain 11/05/2017   Pain of right thumb 11/05/2017   Preventative health care 11/05/2017   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   Lumbar disc disease 08/28/2016   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   Maxillary sinusitis, acute 06/09/2015   Left acute otitis media 06/09/2015   Knee pain, left 03/22/2015   Conjunctivitis 03/27/2014   Ear pain 03/27/2014   Dyspepsia and other specified disorders of function of stomach 06/02/2013   Anxiety state, unspecified 06/02/2013   Severe obesity (BMI >= 40) ([place]) 12/24/2012   VITAMIN B12 DEFICIENCY 10/16/2008   Depression, major, single episode, moderate ([place]) 09/04/2006   GERD 09/04/2006   COLPOSCOPY, HX OF 09/04/2006    Past Surgical History:  Procedure Laterality Date   CESAREAN SECTION     CHOLECYSTECTOMY  2007   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   Procedure: LAPAROSCOPIC ROUX-EN-Y GASTRIC BYPASS WITH UPPER ENDOSCOPY;  Surgeon: Greer Pickerel, MD;  Location: [facility];  Service: General;  Laterality: N/A;     OB History   No obstetric history on file.     Family History  Problem Relation Age of Onset   Breast cancer Mother    Hypertension Mother  Skin cancer Mother    Pancreatic cancer Maternal Grandfather    Arthritis Paternal Grandmother    BRCA 1/2 Paternal Grandmother    Hypertension Father    Skin cancer Father    BRCA 1/2 Cousin    Diabetes Maternal Grandmother     Social History   Tobacco Use   Smoking status: Never   Smokeless tobacco: Never  Substance Use Topics   Alcohol use: Yes    Comment: rare   Drug use: No    Home Medications Prior to Admission medications   Medication Sig Start Date End Date Taking? Authorizing Provider  albuterol (PROAIR HFA) 108 (90 BASE) MCG/ACT inhaler INHALE 2 PUFFS FOUR TIMES DAILY AS NEEDED Patient taking differently: Inhale 1-2 puffs  into the lungs every 6 (six) hours as needed for wheezing or shortness of breath. 12/12/10   Donato Schultz, DO  busPIRone (BUSPAR) 10 MG tablet Take 10 mg by mouth 2 (two) times daily. Patient not taking: Reported on 12/02/2020 08/27/19   [provider]  Doxepin HCl 6 MG TABS Take 1 tablet by mouth at bedtime. 09/13/19   [provider]  fluticasone (FLONASE) 50 MCG/ACT nasal spray Place 2 sprays into both nostrils at bedtime. 06/24/18   Donato Schultz, DO  hydrocortisone (ANUSOL-HC) 2.5 % rectal cream PLACE 1 APPLICATION RECTALLY 2 TIMES DAILY 03/03/19   Zola Button, Grayling Congress, DO  hydrocortisone cream 0.5 % Apply 1 application topically 2 (two) times daily as needed (for ezcema/itchy skin on elbow).    [provider]  Insulin Pen Needle 31G X 5 MM MISC 1 application by Does not apply route daily. 12/02/20   Donato Schultz, DO  levonorgestrel (MIRENA) 20 MCG/24HR IUD 1 each by Intrauterine route once.    [provider]  Liraglutide -Weight Management (SAXENDA) 18 MG/3ML SOPN Inject 3 mg into the skin daily. 12/02/20   Seabron Spates R, DO  NONFORMULARY OR COMPOUNDED ITEM Bariatric multivitamin 1 po qd 12/28/17   Zola Button, Grayling Congress, DO  omeprazole (PRILOSEC) 40 MG capsule TAKE 1 CAPSULE BY MOUTH EVERY DAY 08/09/20   Donato Schultz, DO  Probiotic Product (PROBIOTIC PO) Take by mouth.    [provider]  tretinoin (RETIN-A) 0.05 % cream APPLY TO FACE ONCE DAILY  AT BEDTIME 06/12/16   [provider]  triamcinolone cream (KENALOG) 0.1 %  06/12/16   [provider]  triamcinolone cream (KENALOG) 0.1 % Apply 1 application topically 2 (two) times daily. 10/09/19   Lowne Chase, Yvonne R, DO  TRINTELLIX 20 MG TABS tablet Take 20 mg by mouth daily. 11/23/20   [provider]    Allergies    Amoxicillin, Labetalol hcl, and Sulfonamide derivatives  Review of Systems   Review of Systems  Constitutional:   Negative for chills and fever.  Eyes:  Negative for visual disturbance.  Respiratory:  Positive for shortness of breath.   Cardiovascular:  Negative for chest pain.  Gastrointestinal:  Negative for nausea and vomiting.  Neurological:  Positive for weakness (generalized). Negative for dizziness, syncope, numbness and headaches.       + tingling to left side of face  All other systems reviewed and are negative.  Physical Exam Updated Vital Signs BP (!) 148/97 (BP Location: Left Arm)   Pulse 78   Temp 98.5 F (36.9 C) (Oral)   Resp 18   Ht 5\' 4"  (1.626 m)   Wt 80.3 kg  SpO2 100%   BMI 30.38 kg/m   Physical Exam Vitals and nursing note reviewed.  Constitutional:      Appearance: She is not ill-appearing or diaphoretic.  HENT:     Head: Normocephalic and atraumatic.  Eyes:     Extraocular Movements: Extraocular movements intact.     Conjunctiva/sclera: Conjunctivae normal.     Pupils: Pupils are equal, round, and reactive to light.  Cardiovascular:     Rate and Rhythm: Normal rate and regular rhythm.     Pulses: Normal pulses.  Pulmonary:     Effort: Pulmonary effort is normal.     Breath sounds: Normal breath sounds. No wheezing, rhonchi or rales.  Abdominal:     Palpations: Abdomen is soft.     Tenderness: There is no abdominal tenderness.  Musculoskeletal:     Cervical back: Neck supple.  Skin:    General: Skin is warm and dry.  Neurological:     Mental Status: She is alert.     Comments: Alert and oriented to self, place, time and event.   Speech is fluent, clear without dysarthria or dysphasia.   Strength 5/5 in upper/lower extremities   Sensation intact in upper/lower extremities   Normal gait.  Negative Romberg. No pronator drift.  Normal finger-to-nose and feet tapping.  CN I not tested  CN II grossly intact visual fields bilaterally. Did not visualize posterior eye.  CN III, IV, VI PERRLA and EOMs intact bilaterally  CN V Intact sensation to sharp and  light touch to the face  CN VII facial movements symmetric  CN VIII not tested  CN IX, X no uvula deviation, symmetric rise of soft palate  CN XI 5/5 SCM and trapezius strength bilaterally  CN XII Midline tongue protrusion, symmetric L/R movements      ED Results / Procedures / Treatments   Labs (all labs ordered are listed, but only abnormal results are displayed) Labs Reviewed  CBC WITH DIFFERENTIAL/PLATELET - Abnormal; Notable for the following components:      Result Value   MCHC 36.4 (*)    RDW 11.4 (*)    All other components within normal limits  COMPREHENSIVE METABOLIC PANEL - Abnormal; Notable for the following components:   Sodium 130 (*)    Chloride 96 (*)    Glucose, Bld 103 (*)    BUN 25 (*)    Calcium 8.5 (*)    Total Bilirubin 0.1 (*)    All other components within normal limits  CBG MONITORING, ED - Abnormal; Notable for the following components:   Glucose-Capillary 139 (*)    All other components within normal limits  TROPONIN I (HIGH SENSITIVITY)    EKG EKG Interpretation  Date/Time:  Thursday December 09 2020 17:06:01 EDT Ventricular Rate:  80 PR Interval:  141 QRS Duration: 91 QT Interval:  359 QTC Calculation: 415 R Axis:   50 Text Interpretation: Sinus rhythm No significant change since last tracing Confirmed by Wandra Arthurs [id number]) on 12/09/2020 5:22:40 PM  Radiology CT Angio Head W or Wo Contrast  Result Date: 12/09/2020 CLINICAL DATA:  Dizziness. EXAM: CT ANGIOGRAPHY HEAD AND NECK TECHNIQUE: Multidetector CT imaging of the head and neck was performed using the standard protocol during bolus administration of intravenous contrast. Multiplanar CT image reconstructions and MIPs were obtained to evaluate the vascular anatomy. Carotid stenosis measurements (when applicable) are obtained utilizing NASCET criteria, using the distal internal carotid diameter as the denominator. CONTRAST:  129mL OMNIPAQUE IOHEXOL 350 MG/ML SOLN  COMPARISON:  None. FINDINGS: CT  HEAD FINDINGS Brain: No evidence of acute infarction, hemorrhage, hydrocephalus, extra-axial collection or mass lesion/mass effect. Partially empty sella. Vascular: See below Skull: No acute fracture. Sinuses: Clear sinuses. Orbits: No acute orbital findings. Review of the MIP images confirms the above findings CTA NECK FINDINGS Aortic arch: Great vessel origins are patent. Right carotid system: No evidence of dissection, stenosis (50% or greater) or occlusion. Mildly tortuous ICA skull base. Left carotid system: No evidence of dissection, stenosis (50% or greater) or occlusion. Mildly tortuous ICA at the skull base. Vertebral arteries: Right-dominant. No evidence of dissection, stenosis (50% or greater) or occlusion. Skeleton: No acute findings. Other neck: No acute findings. Upper chest: Visualized lung apices are clear. Review of the MIP images confirms the above findings CTA HEAD FINDINGS Anterior circulation: Bilateral intracranial ICAs, MCAs, and ACAs are patent without proximal hemodynamically significant stenosis. No aneurysm identified. Somewhat lateralized course of the carotid canal bilaterally, which appears covered by bone. Posterior circulation: No significant stenosis, proximal occlusion, aneurysm, or vascular malformation. No aneurysm identified. Venous sinuses: Small left transverse sinus. Arachnoid granulation in the right transverse sinus. Review of the MIP images confirms the above findings IMPRESSION: CT head: 1. No evidence of acute intracranial abnormality. 2. Partially empty sella, which is often a normal anatomic variant but can be associated with idiopathic intracranial hypertension (pseudotumor cerebri). CTA: No large vessel occlusion or proximal hemodynamically significant stenosis in the head or neck. Electronically Signed   By: Margaretha Sheffield MD   On: 12/09/2020 18:56   CT Angio Neck W and/or Wo Contrast  Result Date: 12/09/2020 CLINICAL DATA:  Dizziness. EXAM: CT ANGIOGRAPHY  HEAD AND NECK TECHNIQUE: Multidetector CT imaging of the head and neck was performed using the standard protocol during bolus administration of intravenous contrast. Multiplanar CT image reconstructions and MIPs were obtained to evaluate the vascular anatomy. Carotid stenosis measurements (when applicable) are obtained utilizing NASCET criteria, using the distal internal carotid diameter as the denominator. CONTRAST:  197mL OMNIPAQUE IOHEXOL 350 MG/ML SOLN COMPARISON:  None. FINDINGS: CT HEAD FINDINGS Brain: No evidence of acute infarction, hemorrhage, hydrocephalus, extra-axial collection or mass lesion/mass effect. Partially empty sella. Vascular: See below Skull: No acute fracture. Sinuses: Clear sinuses. Orbits: No acute orbital findings. Review of the MIP images confirms the above findings CTA NECK FINDINGS Aortic arch: Great vessel origins are patent. Right carotid system: No evidence of dissection, stenosis (50% or greater) or occlusion. Mildly tortuous ICA skull base. Left carotid system: No evidence of dissection, stenosis (50% or greater) or occlusion. Mildly tortuous ICA at the skull base. Vertebral arteries: Right-dominant. No evidence of dissection, stenosis (50% or greater) or occlusion. Skeleton: No acute findings. Other neck: No acute findings. Upper chest: Visualized lung apices are clear. Review of the MIP images confirms the above findings CTA HEAD FINDINGS Anterior circulation: Bilateral intracranial ICAs, MCAs, and ACAs are patent without proximal hemodynamically significant stenosis. No aneurysm identified. Somewhat lateralized course of the carotid canal bilaterally, which appears covered by bone. Posterior circulation: No significant stenosis, proximal occlusion, aneurysm, or vascular malformation. No aneurysm identified. Venous sinuses: Small left transverse sinus. Arachnoid granulation in the right transverse sinus. Review of the MIP images confirms the above findings IMPRESSION: CT head:  1. No evidence of acute intracranial abnormality. 2. Partially empty sella, which is often a normal anatomic variant but can be associated with idiopathic intracranial hypertension (pseudotumor cerebri). CTA: No large vessel occlusion or proximal hemodynamically significant stenosis in the head or  neck. Electronically Signed   By: Margaretha Sheffield MD   On: 12/09/2020 18:56    Procedures Procedures   Medications Ordered in ED Medications  sodium chloride 0.9 % bolus 1,000 mL (0 mLs Intravenous Stopped 12/09/20 1923)  iohexol (OMNIPAQUE) 350 MG/ML injection 100 mL (100 mLs Intravenous Contrast Given 12/09/20 1812)    ED Course  I have reviewed the triage vital signs and the nursing notes.  Pertinent labs & imaging results that were available during my care of the patient were reviewed by me and considered in my medical decision making (see chart for details).    MDM Rules/Calculators/A&P                           45 year old female who presents to the ED today with complaint of generalized fatigue, tingling sensation to the left side of her face, shortness of breath, feeling on after drinking an energy drink around 2 PM today.  On arrival to the ED patient was afebrile, nontachycardic and nontachypneic.  Blood pressure slightly elevated 148/97.  No focal neurodeficits on exam despite complaining of tingling to the left side of her face.  Easily able to discern dull and sharp sensation to her face.  Her husband is at bedside and states she is acting at baseline.  Suspect that her symptoms are related to her energy drink that she drank today as well as doing hot yoga yesterday, suspect slight dehydration that was exacerbated with energy drink and caffeine use.  Does not qualify for code stroke at this time however will plan for CTA head and neck given constellation of symptoms.  We will plan for fluids, lab work.  We will add on troponin at this time given complaint of shortness of breath.  Her  symptoms do not consistent with PE at this time.  EKG with normal sinus rhythm.  CBC without leukocytosis. Hgb stable at 13.1 CMP with sodium 130, chloride 96. Creatinine 0.88 and BUN 25. Fluids provided.  Troponin < 2   CTA: IMPRESSION:  CT head:     1. No evidence of acute intracranial abnormality.  2. Partially empty sella, which is often a normal anatomic variant  but can be associated with idiopathic intracranial hypertension  (pseudotumor cerebri).     CTA:     No large vessel occlusion or proximal hemodynamically significant  stenosis in the head or neck.   On further evaluation after fluids patient resting comfortably and states she feels much better, no longer having any feelings of "weirdness" or tingling.  We will plan for discharge home with PCP follow-up at this time.  Instructed to avoid energy drinks in the future and to stay hydrated.  She is in agreement with plan and stable for discharge.   This note was prepared using Dragon voice recognition software and may include unintentional dictation errors due to the inherent limitations of voice recognition software.   Final Clinical Impression(s) / ED Diagnoses Final diagnoses:  Dizziness  Dehydration    Rx / DC Orders ED Discharge Orders     None        Discharge Instructions      Your workup was overall reassuring in the ED today. Please follow up with your PCP regarding ED visit. I would recommend avoiding energy drinks in the future. Please increase the amount of water you drink on a daily basis to stay hydrated.   Return to the ED for any new/worsening  symptoms       Eustaquio Maize, PA-C 12/09/20 1932    Drenda Freeze, MD 12/10/20 [phone]

## 2020-12-09 NOTE — ED Triage Notes (Signed)
Pt c/o feeling light headed, "weak all over and shaky-mouth feels tingly"-sx started ~230pm after drinking an energy drink-also felt SOB when walking to car ~30 min PTA-NAD-steady gait

## 2021-01-11 ENCOUNTER — Other Ambulatory Visit: Payer: Self-pay | Admitting: Family Medicine

## 2021-01-11 DIAGNOSIS — K219 Gastro-esophageal reflux disease without esophagitis: Secondary | ICD-10-CM

## 2021-01-12 ENCOUNTER — Encounter: Payer: Self-pay | Admitting: Family Medicine

## 2021-03-25 ENCOUNTER — Encounter (HOSPITAL_COMMUNITY): Payer: Self-pay | Admitting: *Deleted

## 2021-04-12 ENCOUNTER — Other Ambulatory Visit: Payer: Self-pay | Admitting: *Deleted

## 2021-04-12 ENCOUNTER — Telehealth: Payer: Self-pay | Admitting: *Deleted

## 2021-04-12 NOTE — Telephone Encounter (Signed)
Spoke with patient to get her weight now to start prior auth and she stated that she had stopped the medication, because it made her sick.  Medication list updated.

## 2021-04-12 NOTE — Telephone Encounter (Signed)
Prior auth started via cover my meds.  Awaiting determination.  Key: [id number]

## 2021-05-04 IMAGING — DX DG LUMBAR SPINE COMPLETE 4+V
5 series · 5 of 5 positions shown · non-contrast
Comparison: CT abdomen pelvis 08/25/2017

CLINICAL DATA: Right hip pain, radiating to right leg

EXAM:
LUMBAR SPINE - COMPLETE 4+ VIEW

[l-spine ap]
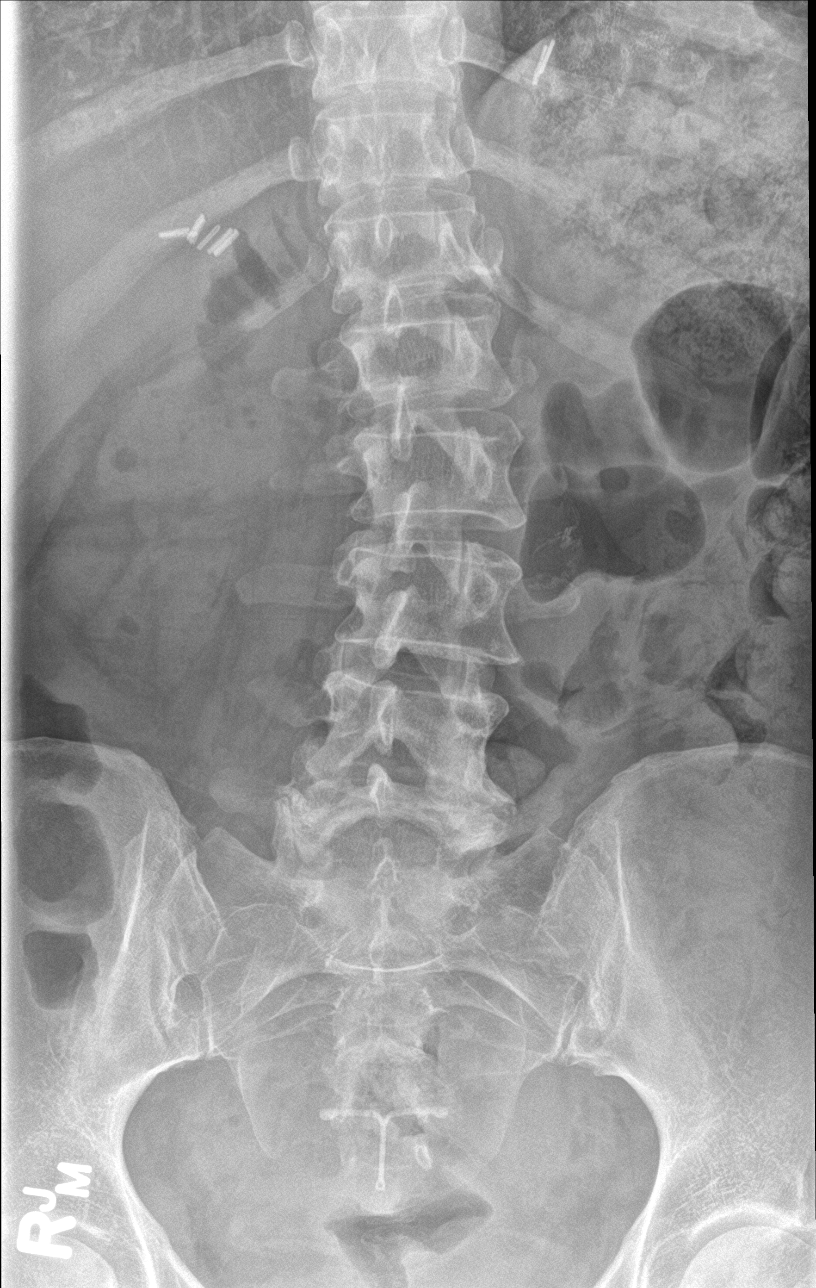

[l-spine obl (1 of 2)]
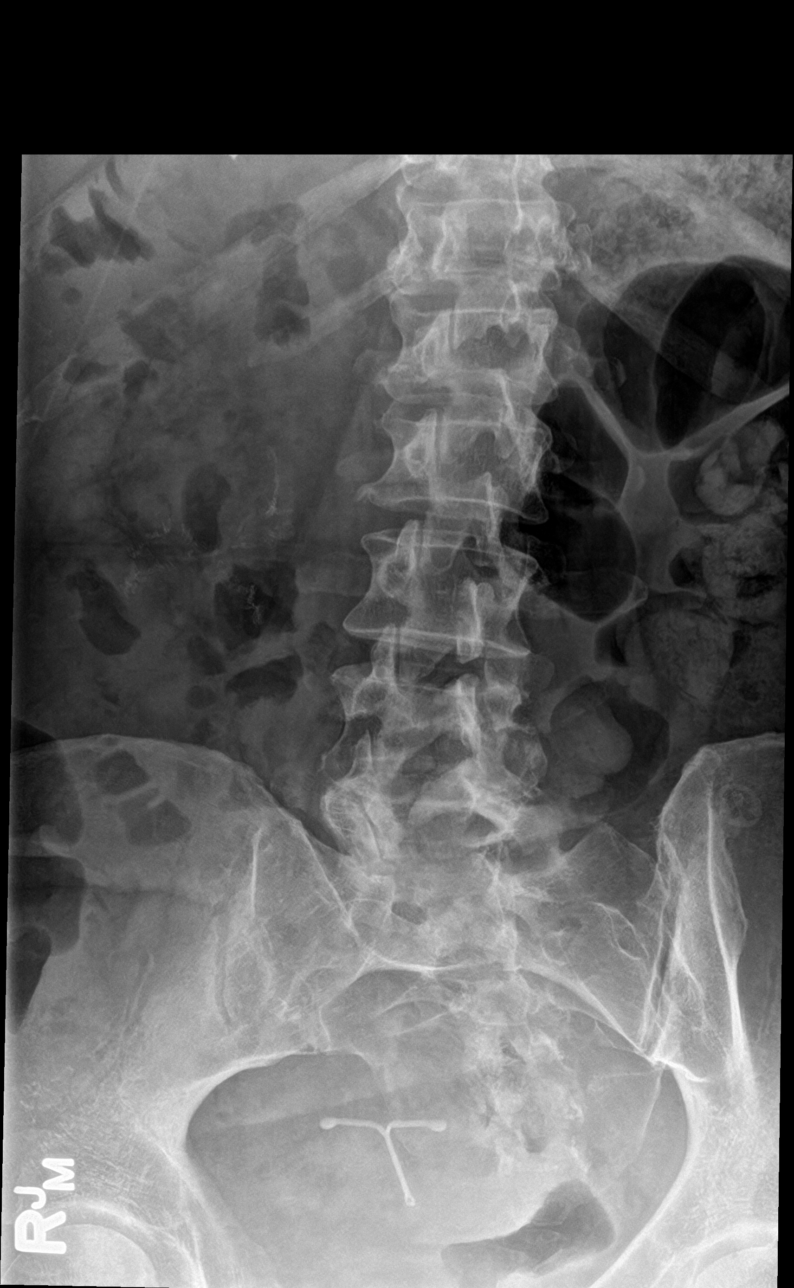

[l-spine obl (2 of 2)]
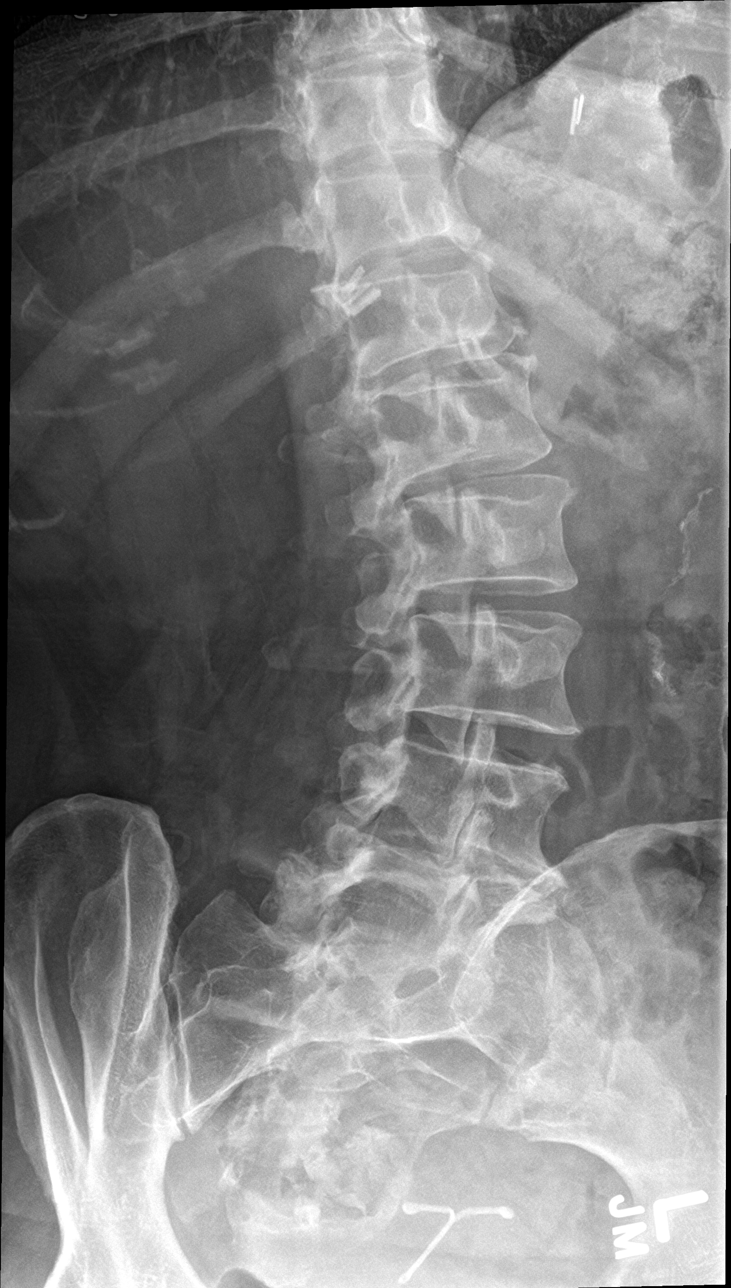

[l-spine lat]
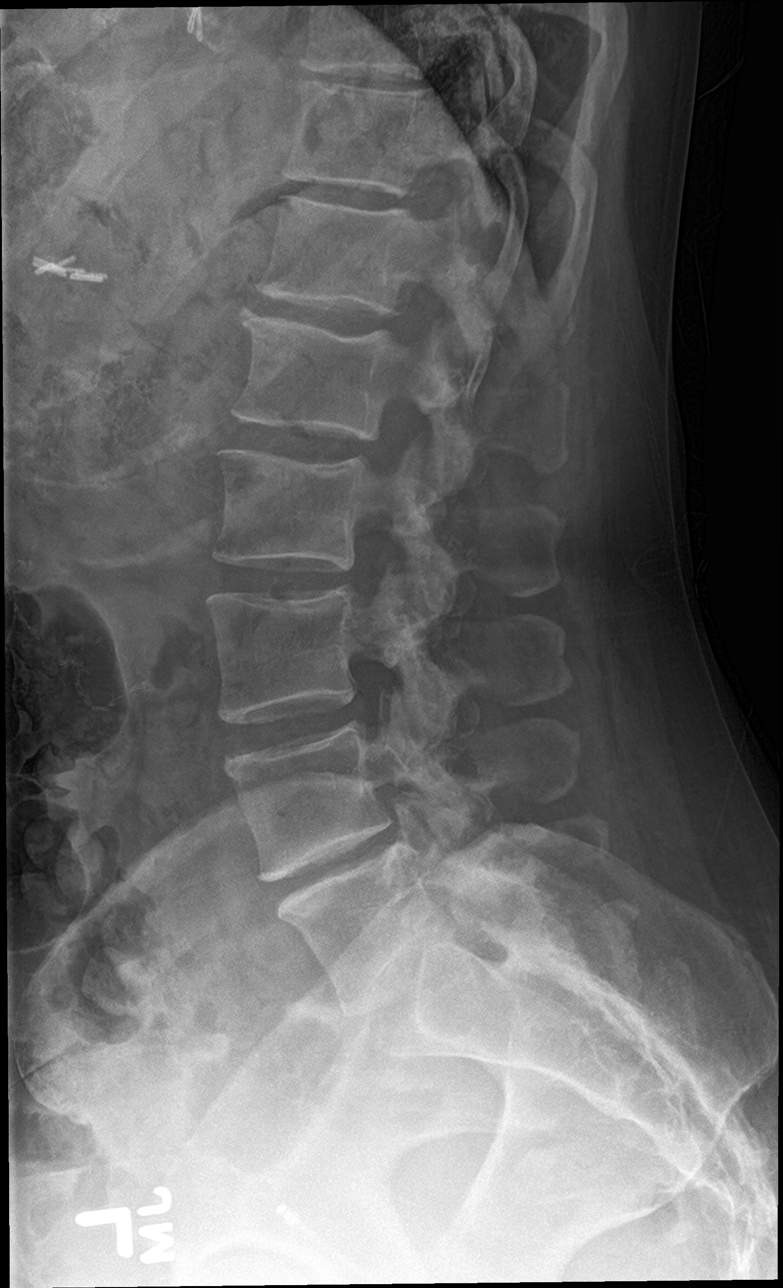

[l-spine spot]
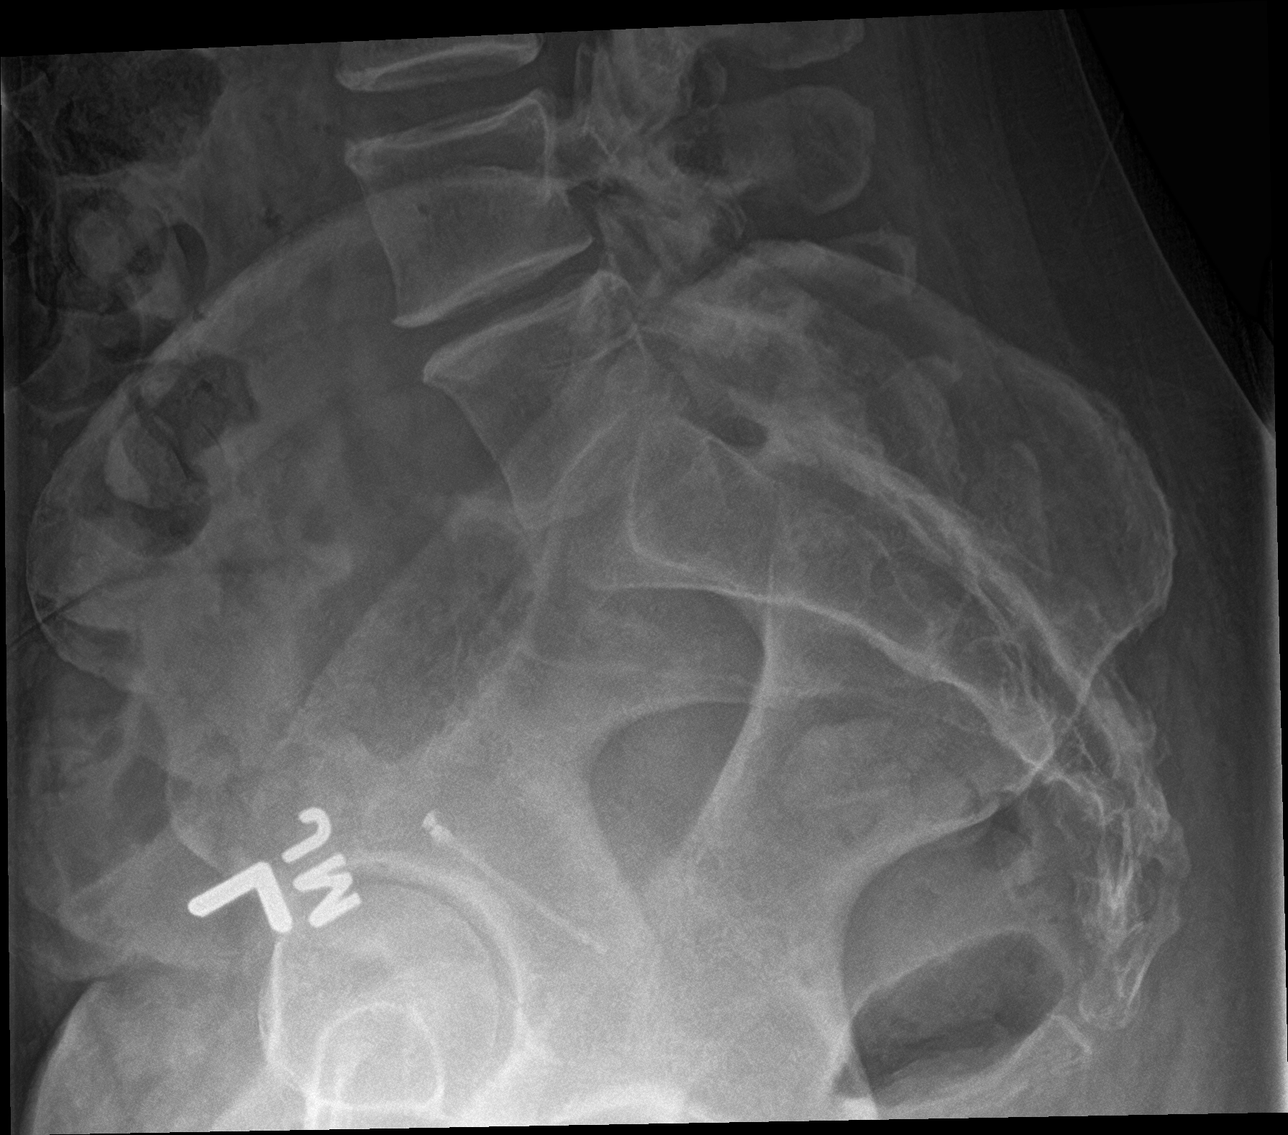

[5 of 5 positions shown; findings below may reference images not displayed]

FINDINGS: Five normally formed lumbar type vertebral bodies. Levocurvature of
the lumbar spine with an apex at the L2-3 level. Preservation of the
normal lumbar lordosis. No spondylolysis or spondylolisthesis. Mild
diffuse intervertebral disc height loss with discogenic endplate
changes more pronounced towards the lower lumbar levels. Diffuse
mild facet degenerative changes most pronounced L5-S1 and no
worrisome osseous lesion. Included portions of bony pelvis are
intact. Cholecystectomy clips in the right upper quadrant. Surgical
material in the region of the bowel in the left abdomen. Radiopaque
IUD in the pelvis. Remaining soft tissues are unremarkable.
IMPRESSION: 1. No acute findings.
2. Mild degenerative disc disease and facet degenerative change.
3. Levocurvature of the lumbar spine.

## 2021-05-24 ENCOUNTER — Ambulatory Visit (INDEPENDENT_AMBULATORY_CARE_PROVIDER_SITE_OTHER): Payer: BC Managed Care – PPO | Admitting: Family Medicine

## 2021-05-24 ENCOUNTER — Encounter: Payer: Self-pay | Admitting: Family Medicine

## 2021-05-24 VITALS — BP 120/80 | Ht 64.0 in | Wt 177.0 lb

## 2021-05-24 DIAGNOSIS — M5416 Radiculopathy, lumbar region: Secondary | ICD-10-CM | POA: Diagnosis not present

## 2021-05-24 MED ORDER — PREDNISONE 5 MG PO TABS: ORAL_TABLET | ORAL | 0 refills | Status: DC

## 2021-05-24 NOTE — Assessment & Plan Note (Addendum)
Acute in nature. Now having left side pain.  -Counseled on home exercise therapy and supportive care. -Prednisone. -Could consider epidural if no improvement.

## 2021-05-24 NOTE — Progress Notes (Signed)
°  [name] - 46 y.o. female MRN [id number]  Date of birth: 10-21-1975  SUBJECTIVE:  Including CC & ROS.  No chief complaint on file.   [name] is a 46 y.o. female that is presenting with radicular pain on the left hip.  This feels similar to her previous pain that was on the right side.  This did have resolution with 2 epidurals.  Pain has been ongoing for about a month.  Has started running long.    Review of Systems See HPI   HISTORY: Past Medical, Surgical, Social, and Family History Reviewed & Updated per EMR.   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   Procedure: LAPAROSCOPIC ROUX-EN-Y GASTRIC BYPASS WITH UPPER ENDOSCOPY;  Surgeon: Greer Pickerel, MD;  Location: [facility];  Service: General;  Laterality: N/A;     PHYSICAL EXAM:  VS: BP 120/80 (BP Location: Left Arm, Patient Position: Sitting)    Ht 5\' 4"  (1.626 m)    Wt 177 lb (80.3 kg)    BMI 30.38 kg/m  Physical Exam Gen: NAD, alert, cooperative with exam, well-appearing MSK: Neurovascularly intact       ASSESSMENT & PLAN:   Lumbar radiculopathy Acute in nature. Now having left side pain.  -Counseled on home exercise therapy and supportive care. -Prednisone. -Could consider epidural if no improvement.

## 2021-06-17 ENCOUNTER — Other Ambulatory Visit: Payer: Self-pay | Admitting: Family Medicine

## 2021-06-17 DIAGNOSIS — K219 Gastro-esophageal reflux disease without esophagitis: Secondary | ICD-10-CM

## 2021-07-25 LAB — HM PAP SMEAR

## 2021-07-25 LAB — HM MAMMOGRAPHY

## 2021-10-04 ENCOUNTER — Ambulatory Visit: Payer: BC Managed Care – PPO | Admitting: Family Medicine

## 2021-11-24 ENCOUNTER — Other Ambulatory Visit: Payer: Self-pay | Admitting: Family Medicine

## 2021-11-24 DIAGNOSIS — K219 Gastro-esophageal reflux disease without esophagitis: Secondary | ICD-10-CM

## 2021-12-06 ENCOUNTER — Encounter: Payer: Self-pay | Admitting: Family Medicine

## 2021-12-06 ENCOUNTER — Ambulatory Visit (INDEPENDENT_AMBULATORY_CARE_PROVIDER_SITE_OTHER): Payer: BC Managed Care – PPO | Admitting: Family Medicine

## 2021-12-06 VITALS — BP 122/92 | HR 82 | Temp 98.6°F | Resp 18 | Ht 64.0 in | Wt 156.8 lb

## 2021-12-06 DIAGNOSIS — Z0000 Encounter for general adult medical examination without abnormal findings: Secondary | ICD-10-CM

## 2021-12-06 DIAGNOSIS — F419 Anxiety disorder, unspecified: Secondary | ICD-10-CM | POA: Diagnosis not present

## 2021-12-06 DIAGNOSIS — Z23 Encounter for immunization: Secondary | ICD-10-CM

## 2021-12-06 DIAGNOSIS — Z9884 Bariatric surgery status: Secondary | ICD-10-CM | POA: Diagnosis not present

## 2021-12-06 DIAGNOSIS — Z1211 Encounter for screening for malignant neoplasm of colon: Secondary | ICD-10-CM

## 2021-12-06 MED ORDER — TRINTELLIX 20 MG PO TABS: 20.0000 mg | ORAL_TABLET | Freq: Every day | ORAL | 3 refills | Status: DC

## 2021-12-06 MED ORDER — ALPRAZOLAM 0.5 MG PO TABS: 0.5000 mg | ORAL_TABLET | Freq: Two times a day (BID) | ORAL | 0 refills | Status: DC | PRN

## 2021-12-06 NOTE — Progress Notes (Signed)
Subjective:   By signing my name below, I, [name], attest that this documentation has been prepared under the direction and in the presence of Sabrina Held, DO  12/06/2021    Patient ID: [name], female    DOB: 04/23/1976, 46 y.o.   MRN: [id number]  Chief Complaint  Patient presents with   Annual Exam    Pt states not fasting     HPI Patient is in today for a comprehensive physical.  She believes her hyperglycemia has worsened over the past year. She has been unable to have refills of Xanax due to this. She typically takes 0.5 mg xanax in the evenings. She has been taking ozempic and Wegovy since her bariatric operation. She has lost weight prior to the visit. She takes the Zofran to manage her symptoms of nausea.  She also continues to take Trintellix to manage her mental health.  She goes kayaking and hiking with her friend to stay active. She also exercises by running.  She plans to follow up with her surgeon soon. She has had a mammogram and pap-smear, but has not had a colonoscopy. She was given a cologaurd, but she states that she threw it away. She is up to date with her Covid-19 vaccinations, and notes that they were all [name].    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   Procedure: LAPAROSCOPIC ROUX-EN-Y GASTRIC BYPASS WITH UPPER ENDOSCOPY;  Surgeon: Greer Pickerel, MD;  Location: [facility];  Service: General;  Laterality: N/A;    Family History  Problem Relation Age of Onset   Breast cancer Mother    Hypertension Mother    Skin cancer Mother    Pancreatic cancer Maternal Grandfather    Arthritis Paternal Grandmother    BRCA 1/2 Paternal Grandmother     Hypertension Father    Skin cancer Father    BRCA 1/2 Cousin    Diabetes Maternal Grandmother     Social History   Socioeconomic History   Marital status: Married    Spouse name: Not on file   Number of children: 2   Years of education: Not on file   Highest education level: Not on file  Occupational History   Occupation: Games developer: [employer]  Tobacco Use   Smoking status: Never   Smokeless tobacco: Never  Substance and Sexual Activity   Alcohol use: Yes    Comment: rare   Drug use: No   Sexual activity: Yes  Other Topics Concern   Not on file  Social History Narrative   Exercising 4-5 days a week    Social Determinants of Health   Financial Resource Strain: Not on file  Food Insecurity: Not on file  Transportation Needs: Not on file  Physical Activity: Not on file  Stress: Not on file  Social Connections: Not on file  Intimate Partner Violence: Not on file    Outpatient Medications Prior to Visit  Medication Sig Dispense Refill   albuterol (PROAIR HFA) 108 (90 BASE) MCG/ACT inhaler INHALE 2 PUFFS FOUR TIMES DAILY AS NEEDED (Patient taking differently: Inhale 1-2 puffs  into the lungs every 6 (six) hours as needed for wheezing or shortness of breath.) 8.5 g 1   Doxepin HCl 6 MG TABS Take 1 tablet by mouth at bedtime.     fluticasone (FLONASE) 50 MCG/ACT nasal spray Place 2 sprays into both nostrils at bedtime. 48 g 0   hydrocortisone (ANUSOL-HC) 2.5 % rectal cream PLACE 1 APPLICATION RECTALLY 2 TIMES DAILY 30 g 0   hydrocortisone cream 0.5 % Apply 1 application topically 2 (two) times daily as needed (for ezcema/itchy skin on elbow).     levonorgestrel (MIRENA) 20 MCG/24HR IUD 1 each by Intrauterine route once.     NONFORMULARY OR COMPOUNDED ITEM Bariatric multivitamin 1 po qd 90 each 3   omeprazole (PRILOSEC) 40 MG capsule TAKE 1 CAPSULE BY MOUTH EVERY DAY 90 capsule 1   Probiotic Product (PROBIOTIC PO) Take by mouth.     tretinoin  (RETIN-A) 0.05 % cream APPLY TO FACE ONCE DAILY  AT BEDTIME  3   triamcinolone cream (KENALOG) 0.1 % Apply 1 application topically 2 (two) times daily. 30 g 0   TRINTELLIX 20 MG TABS tablet Take 20 mg by mouth daily.     busPIRone (BUSPAR) 10 MG tablet Take 10 mg by mouth 2 (two) times daily. (Patient not taking: Reported on 12/02/2020)     predniSONE (DELTASONE) 5 MG tablet Take 6 pills for first day, 5 pills second day, 4 pills third day, 3 pills fourth day, 2 pills the fifth day, and 1 pill sixth day. 21 tablet 0   triamcinolone cream (KENALOG) 0.1 %      No facility-administered medications prior to visit.    Allergies  Allergen Reactions   Amoxicillin Hives    Has patient had a PCN reaction causing immediate rash, facial/tongue/throat swelling, SOB or lightheadedness with hypotension:No Has patient had a PCN reaction causing severe rash involving mucus membranes or skin necrosis:unsure--doesn't think so Has patient had a PCN reaction that required hospitalization:No Has patient had a PCN reaction occurring within the last 10 years:No If all of the above answers are "NO", then may proceed with Cephalosporin use.    Labetalol Hcl Other (See Comments)    Asthma   Sulfonamide Derivatives Hives    Kidney Disease    Review of Systems  Constitutional:  Negative for fever.  HENT:  Negative for congestion, sinus pain and sore throat.   Respiratory:  Negative for cough, shortness of breath and wheezing.   Cardiovascular:  Negative for chest pain and palpitations.  Gastrointestinal:  Negative for abdominal pain, constipation, diarrhea, nausea and vomiting.  Genitourinary:  Negative for dysuria, frequency and hematuria.  Musculoskeletal:  Negative for joint pain and myalgias.  Skin:        (-)new moles  Neurological:  Negative for headaches.       Objective:    Physical Exam Constitutional:      Appearance: Normal appearance. She is not ill-appearing.  HENT:     Head:  Normocephalic and atraumatic.     Right Ear: Tympanic membrane, ear canal and external ear normal.     Left Ear: Tympanic membrane, ear canal and external ear normal.  Eyes:     Extraocular Movements: Extraocular movements intact.     Pupils: Pupils are equal, round, and reactive to light.  Cardiovascular:     Rate and Rhythm: Normal rate and regular rhythm.     Pulses: Normal pulses.     Heart sounds: Normal heart sounds. No murmur heard.  No gallop.  Pulmonary:     Effort: Pulmonary effort is normal. No respiratory distress.     Breath sounds: Normal breath sounds. No wheezing or rales.  Abdominal:     General: Bowel sounds are normal. There is no distension.     Palpations: Abdomen is soft.     Tenderness: There is no abdominal tenderness. There is no guarding.  Skin:    General: Skin is warm and dry.  Neurological:     Mental Status: She is alert and oriented to person, place, and time.  Psychiatric:        Judgment: Judgment normal.     BP (!) 122/92 (BP Location: Left Arm, Patient Position: Sitting, Cuff Size: Normal)   Pulse 82   Temp 98.6 F (37 C) (Oral)   Resp 18   Ht _0  (1.626 m)   Wt 156 lb 12.8 oz (71.1 kg)   SpO2 99%   BMI 26.91 kg/m  Wt Readings from Last 3 Encounters:  12/06/21 156 lb 12.8 oz (71.1 kg)  05/24/21 177 lb (80.3 kg)  12/09/20 177 lb (80.3 kg)    Diabetic Foot Exam - Simple   No data filed    Lab Results  Component Value Date   WBC 6.9 12/06/2021   HGB 13.4 12/06/2021   HCT 38.9 12/06/2021   PLT 312.0 12/06/2021   GLUCOSE 80 12/06/2021   CHOL 157 12/06/2021   TRIG 67.0 12/06/2021   HDL 69.20 12/06/2021   LDLDIRECT 154.3 07/28/2010   LDLCALC 74 12/06/2021   ALT 16 12/06/2021   AST 20 12/06/2021   NA 135 12/06/2021   K 4.3 12/06/2021   CL 96 12/06/2021   CREATININE 0.85 12/06/2021   BUN 16 12/06/2021   CO2 32 12/06/2021   TSH 2.04 12/06/2021   HGBA1C 5.3 12/06/2021    Lab Results  Component Value Date   TSH 2.04  12/06/2021   Lab Results  Component Value Date   WBC 6.9 12/06/2021   HGB 13.4 12/06/2021   HCT 38.9 12/06/2021   MCV 94.3 12/06/2021   PLT 312.0 12/06/2021   Lab Results  Component Value Date   NA 135 12/06/2021   K 4.3 12/06/2021   CO2 32 12/06/2021   GLUCOSE 80 12/06/2021   BUN 16 12/06/2021   CREATININE 0.85 12/06/2021   BILITOT 0.4 12/06/2021   ALKPHOS 57 12/06/2021   AST 20 12/06/2021   ALT 16 12/06/2021   PROT 6.6 12/06/2021   ALBUMIN 4.4 12/06/2021   CALCIUM 9.4 12/06/2021   ANIONGAP 8 12/09/2020   GFR 80.94 12/06/2021   Lab Results  Component Value Date   CHOL 157 12/06/2021   Lab Results  Component Value Date   HDL 69.20 12/06/2021   Lab Results  Component Value Date   LDLCALC 74 12/06/2021   Lab Results  Component Value Date   TRIG 67.0 12/06/2021   Lab Results  Component Value Date   CHOLHDL 2 12/06/2021   Lab Results  Component Value Date   HGBA1C 5.3 12/06/2021   Colonoscopy- Not yet completed.  Pap Smear- Last completed 07/25/2021.  Mammogram- Last completed 07/25/2021. Results are normal. Repeat in 1 year.      Assessment & Plan:   Problem List Items Addressed This Visit       Unprioritized   S/P gastric bypass    Per surgery Is on wegovy       Preventative health care - Primary    ghm utd  Check labs  See avs       Relevant Orders   CBC with Differential/Platelet (Completed)   Comprehensive metabolic panel (Completed)   Lipid panel (Completed)   TSH (Completed)   Hemoglobin A1c (Completed)   Insulin, random (Completed)   Other Visit Diagnoses     Anxiety       Relevant Medications   ALPRAZolam (XANAX) 0.5 MG tablet   TRINTELLIX 20 MG TABS tablet   Other Relevant Orders   CBC with Differential/Platelet (Completed)   Comprehensive metabolic panel (Completed)   Lipid panel (Completed)   TSH (Completed)   Hemoglobin A1c (Completed)   Insulin, random (Completed)   Bariatric surgery status       Relevant Orders    CBC with Differential/Platelet (Completed)   Comprehensive metabolic panel (Completed)   Lipid panel (Completed)   TSH (Completed)   Hemoglobin A1c (Completed)   Insulin, random (Completed)   Colon cancer screening       Relevant Orders   Ambulatory referral to Gastroenterology   Need for Tdap vaccination       Relevant Orders   Tdap vaccine greater than or equal to 7yo IM (Completed)        Meds ordered this encounter  Medications   ALPRAZolam (XANAX) 0.5 MG tablet    Sig: Take 1 tablet (0.5 mg total) by mouth 2 (two) times daily as needed for anxiety.    Dispense:  60 tablet    Refill:  0   TRINTELLIX 20 MG TABS tablet    Sig: Take 1 tablet (20 mg total) by mouth daily.    Dispense:  90 tablet    Refill:  3    I, Sabrina Held, DO, personally preformed the services described in this documentation.  All medical record entries made by the scribe were at my direction and in my presence.  I have reviewed the chart and discharge instructions (if applicable) and agree that the record reflects my personal performance and is accurate and complete. 12/06/2021   I,Tinashe Williams,acting as a scribe for Sabrina Held, DO.,have documented all relevant documentation on the behalf of Sabrina Held, DO,as directed by  Sabrina Held, DO while in the presence of Sabrina Held, DO.

## 2021-12-06 NOTE — Patient Instructions (Signed)
Preventive Care 40-48 Years Old, Female Preventive care refers to lifestyle choices and visits with your health care provider that can promote health and wellness. Preventive care visits are also called wellness exams. What can I expect for my preventive care visit? Counseling Your health care provider may ask you questions about your: Medical history, including: Past medical problems. Family medical history. Pregnancy history. Current health, including: Menstrual cycle. Method of birth control. Emotional well-being. Home life and relationship well-being. Sexual activity and sexual health. Lifestyle, including: Alcohol, nicotine or tobacco, and drug use. Access to firearms. Diet, exercise, and sleep habits. Work and work environment. Sunscreen use. Safety issues such as seatbelt and bike helmet use. Physical exam Your health care provider will check your: Height and weight. These may be used to calculate your BMI (body mass index). BMI is a measurement that tells if you are at a healthy weight. Waist circumference. This measures the distance around your waistline. This measurement also tells if you are at a healthy weight and may help predict your risk of certain diseases, such as type 2 diabetes and high blood pressure. Heart rate and blood pressure. Body temperature. Skin for abnormal spots. What immunizations do I need?  Vaccines are usually given at various ages, according to a schedule. Your health care provider will recommend vaccines for you based on your age, medical history, and lifestyle or other factors, such as travel or where you work. What tests do I need? Screening Your health care provider may recommend screening tests for certain conditions. This may include: Lipid and cholesterol levels. Diabetes screening. This is done by checking your blood sugar (glucose) after you have not eaten for a while (fasting). Pelvic exam and Pap test. Hepatitis B test. Hepatitis C  test. HIV (human immunodeficiency virus) test. STI (sexually transmitted infection) testing, if you are at risk. Lung cancer screening. Colorectal cancer screening. Mammogram. Talk with your health care provider about when you should start having regular mammograms. This may depend on whether you have a family history of breast cancer. BRCA-related cancer screening. This may be done if you have a family history of breast, ovarian, tubal, or peritoneal cancers. Bone density scan. This is done to screen for osteoporosis. Talk with your health care provider about your test results, treatment options, and if necessary, the need for more tests. Follow these instructions at home: Eating and drinking  Eat a diet that includes fresh fruits and vegetables, whole grains, lean protein, and low-fat dairy products. Take vitamin and mineral supplements as recommended by your health care provider. Do not drink alcohol if: Your health care provider tells you not to drink. You are pregnant, may be pregnant, or are planning to become pregnant. If you drink alcohol: Limit how much you have to 0-1 drink a day. Know how much alcohol is in your drink. In the [place] one drink equals one 12 oz bottle of beer (355 mL), one 5 oz glass of wine (148 mL), or one 1 oz glass of hard liquor (44 mL). Lifestyle Brush your teeth every morning and night with fluoride toothpaste. Floss one time each day. Exercise for at least 30 minutes 5 or more days each week. Do not use any products that contain nicotine or tobacco. These products include cigarettes, chewing tobacco, and vaping devices, such as e-cigarettes. If you need help quitting, ask your health care provider. Do not use drugs. If you are sexually active, practice safe sex. Use a condom or other form of protection to   prevent STIs. If you do not wish to become pregnant, use a form of birth control. If you plan to become pregnant, see your health care provider for a  prepregnancy visit. Take aspirin only as told by your health care provider. Make sure that you understand how much to take and what form to take. Work with your health care provider to find out whether it is safe and beneficial for you to take aspirin daily. Find healthy ways to manage stress, such as: Meditation, yoga, or listening to music. Journaling. Talking to a trusted person. Spending time with friends and family. Minimize exposure to UV radiation to reduce your risk of skin cancer. Safety Always wear your seat belt while driving or riding in a vehicle. Do not drive: If you have been drinking alcohol. Do not ride with someone who has been drinking. When you are tired or distracted. While texting. If you have been using any mind-altering substances or drugs. Wear a helmet and other protective equipment during sports activities. If you have firearms in your house, make sure you follow all gun safety procedures. Seek help if you have been physically or sexually abused. What's next? Visit your health care provider once a year for an annual wellness visit. Ask your health care provider how often you should have your eyes and teeth checked. Stay up to date on all vaccines. This information is not intended to replace advice given to you by your health care provider. Make sure you discuss any questions you have with your health care provider. Document Revised: 10/27/2020 Document Reviewed: 10/27/2020 Elsevier Patient Education  [place].

## 2021-12-07 LAB — CBC WITH DIFFERENTIAL/PLATELET
Basophils Absolute: 0.1 10*3/uL (ref 0.0–0.1)
Basophils Relative: 1.3 % (ref 0.0–3.0)
Eosinophils Absolute: 0.1 10*3/uL (ref 0.0–0.7)
Eosinophils Relative: 1.4 % (ref 0.0–5.0)
HCT: 38.9 % (ref 36.0–46.0)
Hemoglobin: 13.4 g/dL (ref 12.0–15.0)
Lymphocytes Relative: 24.4 % (ref 12.0–46.0)
Lymphs Abs: 1.7 10*3/uL (ref 0.7–4.0)
MCHC: 34.4 g/dL (ref 30.0–36.0)
MCV: 94.3 fl (ref 78.0–100.0)
Monocytes Absolute: 0.5 10*3/uL (ref 0.1–1.0)
Monocytes Relative: 6.7 % (ref 3.0–12.0)
Neutro Abs: 4.6 10*3/uL (ref 1.4–7.7)
Neutrophils Relative %: 66.2 % (ref 43.0–77.0)
Platelets: 312 10*3/uL (ref 150.0–400.0)
RBC: 4.12 Mil/uL (ref 3.87–5.11)
RDW: 12.3 % (ref 11.5–15.5)
WBC: 6.9 10*3/uL (ref 4.0–10.5)

## 2021-12-07 LAB — COMPREHENSIVE METABOLIC PANEL
ALT: 16 U/L (ref 0–35)
AST: 20 U/L (ref 0–37)
Albumin: 4.4 g/dL (ref 3.5–5.2)
Alkaline Phosphatase: 57 U/L (ref 39–117)
BUN: 16 mg/dL (ref 6–23)
CO2: 32 mEq/L (ref 19–32)
Calcium: 9.4 mg/dL (ref 8.4–10.5)
Chloride: 96 mEq/L (ref 96–112)
Creatinine, Ser: 0.85 mg/dL (ref 0.40–1.20)
GFR: 80.94 mL/min (ref >60.00)
Glucose, Bld: 80 mg/dL (ref 70–99)
Potassium: 4.3 mEq/L (ref 3.5–5.1)
Sodium: 135 mEq/L (ref 135–145)
Total Bilirubin: 0.4 mg/dL (ref 0.2–1.2)
Total Protein: 6.6 g/dL (ref 6.0–8.3)

## 2021-12-07 LAB — LIPID PANEL
Cholesterol: 157 mg/dL (ref 0–200)
HDL: 69.2 mg/dL (ref >39.00)
LDL Cholesterol: 74 mg/dL (ref 0–99)
NonHDL: 87.52
Total CHOL/HDL Ratio: 2
Triglycerides: 67 mg/dL (ref 0.0–149.0)
VLDL: 13.4 mg/dL (ref 0.0–40.0)

## 2021-12-07 LAB — TSH: TSH: 2.04 u[IU]/mL (ref 0.35–5.50)

## 2021-12-07 LAB — HEMOGLOBIN A1C: Hgb A1c MFr Bld: 5.3 % (ref 4.6–6.5)

## 2021-12-07 LAB — INSULIN, RANDOM: Insulin: 3.9 u[IU]/mL

## 2021-12-24 NOTE — Assessment & Plan Note (Signed)
Per surgery Is on wegovy

## 2021-12-24 NOTE — Assessment & Plan Note (Signed)
ghm utd Check labs  See avs  

## 2022-02-17 ENCOUNTER — Telehealth: Payer: BC Managed Care – PPO | Admitting: Family Medicine

## 2022-02-24 ENCOUNTER — Encounter: Payer: Self-pay | Admitting: Family Medicine

## 2022-02-24 ENCOUNTER — Telehealth (INDEPENDENT_AMBULATORY_CARE_PROVIDER_SITE_OTHER): Payer: BC Managed Care – PPO | Admitting: Family Medicine

## 2022-02-24 DIAGNOSIS — R110 Nausea: Secondary | ICD-10-CM

## 2022-02-24 MED ORDER — ONDANSETRON 4 MG PO TBDP: 4.0000 mg | ORAL_TABLET | Freq: Three times a day (TID) | ORAL | 1 refills | Status: DC | PRN

## 2022-02-24 MED ORDER — WEGOVY 1 MG/0.5ML ~~LOC~~ SOAJ: 1.0000 mg | SUBCUTANEOUS | 5 refills | Status: DC

## 2022-02-24 NOTE — Assessment & Plan Note (Signed)
Due to wegovy=== refill zofran

## 2022-02-24 NOTE — Progress Notes (Signed)
MyChart Video Visit    Virtual Visit via Video Note   This visit type was conducted due to national recommendations for restrictions regarding the COVID-19 Pandemic (e.g. social distancing) in an effort to limit this patient's exposure and mitigate transmission in our community. This patient is at least at moderate risk for complications without adequate follow up. This format is felt to be most appropriate for this patient at this time. Physical exam was limited by quality of the video and audio technology used for the visit. [name] was able to get the patient set up on a video visit.  Patient location: Home Patient and provider in visit Provider location: Office  I discussed the limitations of evaluation and management by telemedicine and the availability of in person appointments. The patient expressed understanding and agreed to proceed.  Visit Date: 02/24/2022.   Today's healthcare provider: [name], DO     Subjective:    Patient ID: [name], female    DOB: Aug 15, 1975, 46 y.o.   MRN: [id number]  No chief complaint on file.   HPI Patient is in today for a virtual visit.  Nausea- Patient is complaining of nausea from 1.7 mg Semaglutide weight management injection. She stopped taking it for 2-4 weeks and takes 4 mg Zofran for her nausea. She continues to use Zofran even after stopping 1.7 mg Semaglutide injection.  Refills- Patient is refilling 4 mg Zofran and 1 mg Semaglutide weight management injection.  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   Procedure: LAPAROSCOPIC ROUX-EN-Y GASTRIC BYPASS WITH UPPER ENDOSCOPY;  Surgeon: Greer Pickerel, MD;  Location: [facility];  Service: General;  Laterality: N/A;    Family History  Problem Relation Age of Onset   Breast cancer Mother    Hypertension Mother    Skin cancer Mother    Pancreatic cancer Maternal Grandfather    Arthritis Paternal Grandmother    BRCA 1/2 Paternal Grandmother    Hypertension Father    Skin cancer Father    BRCA 1/2 Cousin    Diabetes Maternal Grandmother     Social History   Socioeconomic History   Marital status: Married    Spouse name: Not on file   Number of children: 2   Years of education: Not on file   Highest education level: Not on file  Occupational History   Occupation: Games developer: [employer]  Tobacco Use   Smoking status: Never   Smokeless tobacco: Never  Substance and Sexual Activity   Alcohol use: Yes    Comment: rare   Drug use: No   Sexual activity: Yes  Other Topics Concern   Not on file  Social History Narrative   Exercising 4-5 days a week    Social Determinants of Health   Financial Resource Strain: Not on file  Food Insecurity: Not on file  Transportation Needs: Not on file  Physical Activity: Not on file  Stress: Not on file  Social Connections: Not on file  Intimate Partner Violence: Not on file    Outpatient Medications Prior to Visit  Medication Sig  Dispense Refill   albuterol (PROAIR HFA) 108 (90 BASE) MCG/ACT inhaler INHALE 2 PUFFS FOUR TIMES DAILY AS NEEDED (Patient taking differently: Inhale 1-2 puffs into the lungs every 6 (six) hours as needed for wheezing or shortness of breath.) 8.5 g 1   ALPRAZolam (XANAX) 0.5 MG tablet Take 1 tablet (0.5 mg total) by mouth 2 (two) times daily as needed for anxiety. 60 tablet 0   Doxepin HCl 6 MG TABS Take 1 tablet by mouth at bedtime.     fluticasone (FLONASE) 50 MCG/ACT nasal spray Place 2 sprays into both nostrils at bedtime. 48 g 0   hydrocortisone (ANUSOL-HC) 2.5 % rectal cream PLACE 1 APPLICATION RECTALLY 2 TIMES DAILY 30 g 0   hydrocortisone cream 0.5 %  Apply 1 application topically 2 (two) times daily as needed (for ezcema/itchy skin on elbow).     levonorgestrel (MIRENA) 20 MCG/24HR IUD 1 each by Intrauterine route once.     NONFORMULARY OR COMPOUNDED ITEM Bariatric multivitamin 1 po qd 90 each 3   omeprazole (PRILOSEC) 40 MG capsule TAKE 1 CAPSULE BY MOUTH EVERY DAY 90 capsule 1   Probiotic Product (PROBIOTIC PO) Take by mouth.     tretinoin (RETIN-A) 0.05 % cream APPLY TO FACE ONCE DAILY  AT BEDTIME  3   triamcinolone cream (KENALOG) 0.1 % Apply 1 application topically 2 (two) times daily. 30 g 0   TRINTELLIX 20 MG TABS tablet Take 1 tablet (20 mg total) by mouth daily. 90 tablet 3   No facility-administered medications prior to visit.    Allergies  Allergen Reactions   Amoxicillin Hives    Has patient had a PCN reaction causing immediate rash, facial/tongue/throat swelling, SOB or lightheadedness with hypotension:No Has patient had a PCN reaction causing severe rash involving mucus membranes or skin necrosis:unsure--doesn't think so Has patient had a PCN reaction that required hospitalization:No Has patient had a PCN reaction occurring within the last 10 years:No If all of the above answers are "NO", then may proceed with Cephalosporin use.    Labetalol Hcl Other (See Comments)    Asthma   Sulfonamide Derivatives Hives    Kidney Disease    Review of Systems  Constitutional:  Negative for fever and malaise/fatigue.  HENT:  Negative for congestion.   Eyes:  Negative for blurred vision.  Respiratory:  Negative for shortness of breath.   Cardiovascular:  Negative for chest pain, palpitations and leg swelling.  Gastrointestinal:  Negative for abdominal pain, blood in stool and nausea.  Genitourinary:  Negative for dysuria and frequency.  Musculoskeletal:  Negative for falls.  Skin:  Negative for rash.  Neurological:  Negative for dizziness, loss of consciousness and headaches.  Endo/Heme/Allergies:  Negative for environmental  allergies.  Psychiatric/Behavioral:  Negative for depression. The patient is not nervous/anxious.        Objective:    Physical Exam Vitals and nursing note reviewed.  Pulmonary:     Effort: Pulmonary effort is normal.  Psychiatric:        Mood and Affect: Mood normal.        Behavior: Behavior normal.        Thought Content: Thought content normal.        Judgment: Judgment normal.     There were no vitals taken for this visit. Wt Readings from Last 3 Encounters:  12/06/21 156 lb 12.8 oz (71.1 kg)  05/24/21 177 lb (80.3 kg)  12/09/20 177 lb (80.3 kg)    Diabetic Foot Exam -  Simple   No data filed    Lab Results  Component Value Date   WBC 6.9 12/06/2021   HGB 13.4 12/06/2021   HCT 38.9 12/06/2021   PLT 312.0 12/06/2021   GLUCOSE 80 12/06/2021   CHOL 157 12/06/2021   TRIG 67.0 12/06/2021   HDL 69.20 12/06/2021   LDLDIRECT 154.3 07/28/2010   LDLCALC 74 12/06/2021   ALT 16 12/06/2021   AST 20 12/06/2021   NA 135 12/06/2021   K 4.3 12/06/2021   CL 96 12/06/2021   CREATININE 0.85 12/06/2021   BUN 16 12/06/2021   CO2 32 12/06/2021   TSH 2.04 12/06/2021   HGBA1C 5.3 12/06/2021    Lab Results  Component Value Date   TSH 2.04 12/06/2021   Lab Results  Component Value Date   WBC 6.9 12/06/2021   HGB 13.4 12/06/2021   HCT 38.9 12/06/2021   MCV 94.3 12/06/2021   PLT 312.0 12/06/2021   Lab Results  Component Value Date   NA 135 12/06/2021   K 4.3 12/06/2021   CO2 32 12/06/2021   GLUCOSE 80 12/06/2021   BUN 16 12/06/2021   CREATININE 0.85 12/06/2021   BILITOT 0.4 12/06/2021   ALKPHOS 57 12/06/2021   AST 20 12/06/2021   ALT 16 12/06/2021   PROT 6.6 12/06/2021   ALBUMIN 4.4 12/06/2021   CALCIUM 9.4 12/06/2021   ANIONGAP 8 12/09/2020   GFR 80.94 12/06/2021   Lab Results  Component Value Date   CHOL 157 12/06/2021   Lab Results  Component Value Date   HDL 69.20 12/06/2021   Lab Results  Component Value Date   LDLCALC 74 12/06/2021   Lab  Results  Component Value Date   TRIG 67.0 12/06/2021   Lab Results  Component Value Date   CHOLHDL 2 12/06/2021   Lab Results  Component Value Date   HGBA1C 5.3 12/06/2021       Assessment & Plan:   Problem List Items Addressed This Visit       Unprioritized   Nausea    Due to wegovy=== refill zofran       Relevant Medications   ondansetron (ZOFRAN-ODT) 4 MG disintegrating tablet   Morbid obesity ([place]) - Primary    Doing well with wegovy===  Pt has reached goal Dec dose to 1.0 weekly ----  Pt is in agreement  She will let us know if she would like to dec dose some more       Relevant Medications   Semaglutide-Weight Management (WEGOVY) 1 MG/0.5ML SOAJ     Meds ordered this encounter  Medications   Semaglutide-Weight Management (WEGOVY) 1 MG/0.5ML SOAJ    Sig: Inject 1 mg into the skin once a week.    Dispense:  2 mL    Refill:  5   ondansetron (ZOFRAN-ODT) 4 MG disintegrating tablet    Sig: Take 1 tablet (4 mg total) by mouth every 8 (eight) hours as needed for nausea or vomiting.    Dispense:  30 tablet    Refill:  1    I discussed the assessment and treatment plan with the patient. The patient was provided an opportunity to ask questions and all were answered. The patient agreed with the plan and demonstrated an understanding of the instructions.   The patient was advised to call back or seek an in-person evaluation if the symptoms worsen or if the condition fails to improve as anticipated.    I,Jalicia Roszak R Lowne Chase,acting as a scribe for  [name], DO.,have documented all relevant documentation on the behalf of [name], DO,as directed by  [name], DO while in the presence of [name], DO.   [name], DO [place] [facility] [phone] (phone) [phone] (fax)  [place]

## 2022-02-24 NOTE — Assessment & Plan Note (Signed)
Doing well with wegovy===  Pt has reached goal Dec dose to 1.0 weekly ----  Pt is in agreement  She will let us know if she would like to dec dose some more

## 2022-03-08 ENCOUNTER — Other Ambulatory Visit: Payer: Self-pay | Admitting: Family Medicine

## 2022-03-08 DIAGNOSIS — F419 Anxiety disorder, unspecified: Secondary | ICD-10-CM

## 2022-03-09 ENCOUNTER — Encounter: Payer: Self-pay | Admitting: Family Medicine

## 2022-03-09 NOTE — Telephone Encounter (Signed)
Requesting: alprazolam 0.5mg  Contract: None UDS: None Last Visit: 02/24/22 Next Visit: None Last Refill: 12/06/21 #60 and 0RF  Please Advise

## 2022-03-10 ENCOUNTER — Encounter: Payer: Self-pay | Admitting: Family Medicine

## 2022-03-14 ENCOUNTER — Other Ambulatory Visit: Payer: Self-pay | Admitting: Family Medicine

## 2022-03-14 MED ORDER — WEGOVY 1.7 MG/0.75ML ~~LOC~~ SOAJ: 1.7000 mg | SUBCUTANEOUS | 0 refills | Status: DC

## 2022-03-27 ENCOUNTER — Encounter: Payer: Self-pay | Admitting: Family Medicine

## 2022-03-27 ENCOUNTER — Ambulatory Visit: Payer: BC Managed Care – PPO | Admitting: Family Medicine

## 2022-03-27 ENCOUNTER — Ambulatory Visit: Payer: Self-pay

## 2022-03-27 VITALS — BP 120/74 | Ht 64.0 in | Wt 145.0 lb

## 2022-03-27 DIAGNOSIS — M25552 Pain in left hip: Secondary | ICD-10-CM

## 2022-03-27 MED ORDER — METHYLPREDNISOLONE ACETATE 40 MG/ML IJ SUSP
40.0000 mg | Freq: Once | INTRAMUSCULAR | Status: AC
  Administered 2022-03-27: 40 mg via INTRA_ARTICULAR

## 2022-03-27 NOTE — Progress Notes (Signed)
  [name] - 45 y.o. female MRN [id number]  Date of birth: 01/19/1976  SUBJECTIVE:  Including CC & ROS.  No chief complaint on file.   [name] is a 46 y.o. female that is presenting with acute left hip pain.  Has been ongoing for about 6 weeks.  The pain is worse with running and at times walking.  Pain is localized to the lateral hip.   Review of Systems See HPI   HISTORY: Past Medical, Surgical, Social, and Family History Reviewed & Updated per EMR.   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   Procedure: LAPAROSCOPIC ROUX-EN-Y GASTRIC BYPASS WITH UPPER ENDOSCOPY;  Surgeon: Gaynelle Adu, MD;  Location: [facility];  Service: General;  Laterality: N/A;     PHYSICAL EXAM:  VS: BP 120/74   Ht 5\' 4"  (1.626 m)   Wt 145 lb (65.8 kg)   BMI 24.89 kg/m  Physical Exam Gen: NAD, alert, cooperative with exam, well-appearing MSK:  Neurovascularly intact     Aspiration/Injection Procedure Note YESLI VANDERHOFF 1975-09-18  Procedure: Injection Indications: Left hip pain  Procedure Details Consent: Risks of procedure as well as the alternatives and risks of each were explained to the (patient/caregiver).  Consent for procedure obtained. Time Out: Verified patient identification, verified procedure, site/side was marked, verified correct patient position, special equipment/implants available, medications/allergies/relevent history reviewed, required imaging and test results available.  Performed.  The area was cleaned with iodine and alcohol swabs.    The left greater trochanteric bursa was injected using 1 cc's of 40 mg Depo-Medrol and 4 cc's of 0.25%  bupivacaine with a 22 3 1/2" needle.  Ultrasound was used. Images were obtained in short views showing the injection.     A sterile dressing was applied.  Patient did tolerate procedure well.     ASSESSMENT & PLAN:   Greater trochanteric pain syndrome of left lower extremity Acutely occurring on the left side.  Localized to the lateral hip.  She does run about 12 miles per week. -Counseled on home exercise therapy and supportive care. -Injection today. -Could consider physical therapy.07/19/1975

## 2022-03-27 NOTE — Assessment & Plan Note (Signed)
Acutely occurring on the left side.  Localized to the lateral hip.  She does run about 12 miles per week. -Counseled on home exercise therapy and supportive care. -Injection today. -Could consider physical therapy.Marland Kitchen

## 2022-03-27 NOTE — Patient Instructions (Signed)
Good to see you Please alternate heat and ice  Please continue the exercises   Please send me a message in MyChart with any questions or updates.  Please see me back in 4 weeks or as needed if better.   --Dr. Jordan Likes

## 2022-04-11 ENCOUNTER — Other Ambulatory Visit: Payer: Self-pay | Admitting: Family Medicine

## 2022-04-11 MED ORDER — WEGOVY 2.4 MG/0.75ML ~~LOC~~ SOAJ: 2.4000 mg | SUBCUTANEOUS | 3 refills | Status: DC

## 2022-05-09 ENCOUNTER — Other Ambulatory Visit: Payer: Self-pay | Admitting: Family Medicine

## 2022-05-09 DIAGNOSIS — K219 Gastro-esophageal reflux disease without esophagitis: Secondary | ICD-10-CM

## 2022-06-01 ENCOUNTER — Encounter: Payer: BC Managed Care – PPO | Admitting: Gastroenterology

## 2022-06-05 ENCOUNTER — Ambulatory Visit (AMBULATORY_SURGERY_CENTER): Payer: BC Managed Care – PPO

## 2022-06-05 VITALS — Ht 64.0 in | Wt 143.0 lb

## 2022-06-05 DIAGNOSIS — Z1211 Encounter for screening for malignant neoplasm of colon: Secondary | ICD-10-CM

## 2022-06-05 MED ORDER — NA SULFATE-K SULFATE-MG SULF 17.5-3.13-1.6 GM/177ML PO SOLN
1.0000 | Freq: Once | ORAL | 0 refills | Status: AC
Start: 2022-06-05 — End: 2022-06-05

## 2022-06-05 NOTE — Progress Notes (Signed)

## 2022-06-17 ENCOUNTER — Telehealth: Payer: Self-pay | Admitting: Family Medicine

## 2022-06-19 NOTE — Telephone Encounter (Signed)
Tried initiating PA through [employer]. Having trouble. Will try again later.

## 2022-06-20 NOTE — Telephone Encounter (Signed)
PA cancelled by [employer] of [place]. Pt's plan no longer covers for weight loss medications.   Case number: [id number]

## 2022-06-21 ENCOUNTER — Encounter: Payer: Self-pay | Admitting: Family Medicine

## 2022-06-21 MED ORDER — WEGOVY 2.4 MG/0.75ML ~~LOC~~ SOAJ: 2.4000 mg | SUBCUTANEOUS | 0 refills | Status: DC

## 2022-06-22 ENCOUNTER — Encounter: Payer: Self-pay | Admitting: Gastroenterology

## 2022-07-02 ENCOUNTER — Other Ambulatory Visit: Payer: Self-pay | Admitting: Family Medicine

## 2022-07-02 DIAGNOSIS — F419 Anxiety disorder, unspecified: Secondary | ICD-10-CM

## 2022-07-03 NOTE — Telephone Encounter (Signed)
Requesting: alprazolam 0.68m  Contract: None UDS: None Last Visit: 02/24/22 Next Visit: None Last Refill: 03/09/22 #60 and 0RF   Please Advise

## 2022-07-04 ENCOUNTER — Encounter: Payer: Self-pay | Admitting: Certified Registered Nurse Anesthetist

## 2022-07-05 ENCOUNTER — Encounter: Payer: Self-pay | Admitting: Gastroenterology

## 2022-07-05 ENCOUNTER — Ambulatory Visit (AMBULATORY_SURGERY_CENTER): Payer: BC Managed Care – PPO | Admitting: Gastroenterology

## 2022-07-05 VITALS — BP 130/62 | HR 51 | Temp 98.0°F | Resp 15 | Ht 64.0 in | Wt 143.0 lb

## 2022-07-05 DIAGNOSIS — Z1211 Encounter for screening for malignant neoplasm of colon: Secondary | ICD-10-CM

## 2022-07-05 MED ORDER — SODIUM CHLORIDE 0.9 % IV SOLN
500.0000 mL | Freq: Once | INTRAVENOUS | Status: DC
Start: 2022-07-05 — End: 2022-07-05

## 2022-07-05 NOTE — Progress Notes (Signed)
[facility] History and Physical   Primary Care Physician:  Carollee Herter, Alferd Apa, DO   Reason for Procedure:  Colorectal cancer screening  Plan:    Screening colonoscopy with possible interventions as needed     HPI: [name] is a very pleasant 47 y.o. female here for screening colonoscopy. Denies any nausea, vomiting, abdominal pain, melena or bright red blood per rectum  The risks and benefits as well as alternatives of endoscopic procedure(s) have been discussed and reviewed. All questions answered. The patient agrees to proceed.    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 Authorizing Provider  ALPRAZolam Duanne Moron) 0.5 MG tablet TAKE 1 TABLET BY MOUTH TWICE A DAY AS NEEDED FOR ANXIETY 07/03/22  Yes Roma Schanz R, DO  Doxepin HCl 6 MG TABS Take 1 tablet by mouth at bedtime. 09/13/19  Yes [provider]  fluticasone (FLONASE) 50 MCG/ACT nasal spray Place 2 sprays into both nostrils at bedtime. 06/24/18  Yes Lowne Chase, Yvonne R, DO  Na Sulfate-K Sulfate-Mg Sulf 17.5-3.13-1.6 GM/177ML SOLN Take by mouth once. 06/05/22  Yes [provider]  NONFORMULARY OR COMPOUNDED ITEM Bariatric multivitamin 1 po qd 12/28/17  Yes Roma Schanz R, DO  omeprazole (PRILOSEC) 40 MG capsule TAKE 1 CAPSULE BY MOUTH EVERY DAY 05/10/22  Yes Roma Schanz R, DO  ondansetron (ZOFRAN-ODT) 4 MG disintegrating tablet Take 1 tablet (4 mg total) by mouth every 8  (eight) hours as needed for nausea or vomiting. 02/24/22  Yes Roma Schanz R, DO  Probiotic Product (PROBIOTIC PO) Take by mouth.   Yes [provider]  tretinoin (RETIN-A) 0.05 % cream APPLY TO FACE ONCE DAILY  AT BEDTIME 06/12/16  Yes [provider]  TRINTELLIX 20 MG TABS tablet Take 1 tablet (20 mg total) by mouth daily. 12/06/21  Yes Roma Schanz R, DO  albuterol (PROAIR HFA) 108 (90 BASE) MCG/ACT inhaler INHALE 2 PUFFS FOUR TIMES DAILY AS NEEDED Patient taking differently: Inhale 1-2 puffs into the lungs every 6 (six) hours as needed for wheezing or shortness of breath. 12/12/10   [name], DO  hydrocortisone (ANUSOL-HC) 2.5 % rectal cream PLACE 1 APPLICATION RECTALLY 2 TIMES DAILY Patient not taking: Reported on 06/05/2022 03/03/19   [name], DO  hydrocortisone cream 0.5 % Apply 1 application topically 2 (two) times daily as needed (for ezcema/itchy skin on elbow). Patient not taking: Reported on 06/05/2022    [provider]  levonorgestrel (MIRENA) 20 MCG/24HR IUD 1 each by Intrauterine route once.    [provider]  Semaglutide-Weight Management (WEGOVY) 2.4 MG/0.75ML SOAJ Inject 2.4 mg into the skin once a week. 06/21/22   Roma Schanz R, DO  triamcinolone cream (KENALOG) 0.1 % Apply 1 application topically 2 (two) times daily. Patient not taking: Reported on 06/05/2022 10/09/19   [name], DO    Current Outpatient  Medications  Medication Sig Dispense Refill   ALPRAZolam (XANAX) 0.5 MG tablet TAKE 1 TABLET BY MOUTH TWICE A DAY AS NEEDED FOR ANXIETY 60 tablet 0   Doxepin HCl 6 MG TABS Take 1 tablet by mouth at bedtime.     fluticasone (FLONASE) 50 MCG/ACT nasal spray Place 2 sprays into both nostrils at bedtime. 48 g 0   Na Sulfate-K Sulfate-Mg Sulf 17.5-3.13-1.6 GM/177ML SOLN Take by mouth once.     NONFORMULARY OR COMPOUNDED ITEM Bariatric multivitamin 1 po qd 90 each 3   omeprazole (PRILOSEC)  40 MG capsule TAKE 1 CAPSULE BY MOUTH EVERY DAY 90 capsule 1   ondansetron (ZOFRAN-ODT) 4 MG disintegrating tablet Take 1 tablet (4 mg total) by mouth every 8 (eight) hours as needed for nausea or vomiting. 30 tablet 1   Probiotic Product (PROBIOTIC PO) Take by mouth.     tretinoin (RETIN-A) 0.05 % cream APPLY TO FACE ONCE DAILY  AT BEDTIME  3   TRINTELLIX 20 MG TABS tablet Take 1 tablet (20 mg total) by mouth daily. 90 tablet 3   albuterol (PROAIR HFA) 108 (90 BASE) MCG/ACT inhaler INHALE 2 PUFFS FOUR TIMES DAILY AS NEEDED (Patient taking differently: Inhale 1-2 puffs into the lungs every 6 (six) hours as needed for wheezing or shortness of breath.) 8.5 g 1   hydrocortisone (ANUSOL-HC) 2.5 % rectal cream PLACE 1 APPLICATION RECTALLY 2 TIMES DAILY (Patient not taking: Reported on 06/05/2022) 30 g 0   hydrocortisone cream 0.5 % Apply 1 application topically 2 (two) times daily as needed (for ezcema/itchy skin on elbow). (Patient not taking: Reported on 06/05/2022)     levonorgestrel (MIRENA) 20 MCG/24HR IUD 1 each by Intrauterine route once.     Semaglutide-Weight Management (WEGOVY) 2.4 MG/0.75ML SOAJ Inject 2.4 mg into the skin once a week. 9 mL 0   triamcinolone cream (KENALOG) 0.1 % Apply 1 application topically 2 (two) times daily. (Patient not taking: Reported on 06/05/2022) 30 g 0   Current Facility-Administered Medications  Medication Dose Route Frequency Provider Last Rate Last Admin   0.9 %  sodium chloride infusion  500 mL Intravenous Once Mauri Pole, MD        Allergies as of 07/05/2022 - Review Complete 07/05/2022  Allergen Reaction Noted   Amoxicillin Hives    Labetalol hcl Other (See Comments)    Sulfonamide derivatives Hives     Family History  Problem Relation Age of Onset   Breast cancer Mother    Hypertension Mother    Skin cancer Mother    Colon polyps Father    Hypertension Father    Skin cancer Father    Diabetes Maternal Grandmother    Colon cancer  Maternal Grandfather    Pancreatic cancer Maternal Grandfather    Arthritis Paternal Grandmother    BRCA 1/2 Paternal Grandmother    BRCA 1/2 Cousin    Esophageal cancer Neg Hx    Rectal cancer Neg Hx    Stomach cancer Neg Hx     Social History   Socioeconomic History   Marital status: Married    Spouse name: Not on file   Number of children: 2   Years of education: Not on file   Highest education level: Not on file  Occupational History   Occupation: Merchant navy officer    Employer: [employer]  Tobacco Use   Smoking status: Never   Smokeless tobacco: Never  Vaping Use   Vaping Use: Never used  Substance and Sexual  Activity   Alcohol use: Yes    Comment: rare   Drug use: No   Sexual activity: Yes  Other Topics Concern   Not on file  Social History Narrative   Exercising 4-5 days a week    Social Determinants of Health   Financial Resource Strain: Not on file  Food Insecurity: Not on file  Transportation Needs: Not on file  Physical Activity: Not on file  Stress: Not on file  Social Connections: Not on file  Intimate Partner Violence: Not on file    Review of Systems:  All other review of systems negative except as mentioned in the HPI.  Physical Exam: Vital signs in last 24 hours: Blood Pressure 127/81   Pulse 66   Temperature 98 F (36.7 C) (Temporal)   Height 5' 4"$  (1.626 m)   Weight 143 lb (64.9 kg)   Oxygen Saturation 100%   Body Mass Index 24.55 kg/m  General:   Alert, NAD Lungs:  Clear .   Heart:  Regular rate and rhythm Abdomen:  Soft, nontender and nondistended. Neuro/Psych:  Alert and cooperative. Normal mood and affect. A and O x 3  Reviewed labs, radiology imaging, old records and pertinent past GI work up  Patient is appropriate for planned procedure(s) and anesthesia in an ambulatory setting   K. Denzil Magnuson , MD [phone]

## 2022-07-05 NOTE — Progress Notes (Signed)
0934 Report given to PACU, vss

## 2022-07-05 NOTE — Progress Notes (Signed)
[id number] Pt states she had an episode of hypoglycemia  on Monday, FSBS performed with results 82. vss

## 2022-07-05 NOTE — Patient Instructions (Addendum)
Thank you for coming in to see [place] today. Resume your previous medications/supplements today. Return to regular daily activities tomorrow. Recommend next screening colonoscopy in 10 years. Handout for hemorrhoidal banding provided.    YOU HAD AN ENDOSCOPIC PROCEDURE TODAY AT [facility]:   Refer to the procedure report that was given to you for any specific questions about what was found during the examination.  If the procedure report does not answer your questions, please call your gastroenterologist to clarify.  If you requested that your care partner not be given the details of your procedure findings, then the procedure report has been included in a sealed envelope for you to review at your convenience later.  YOU SHOULD EXPECT: Some feelings of bloating in the abdomen. Passage of more gas than usual.  Walking can help get rid of the air that was put into your GI tract during the procedure and reduce the bloating. If you had a lower endoscopy (such as a colonoscopy or flexible sigmoidoscopy) you may notice spotting of blood in your stool or on the toilet paper. If you underwent a bowel prep for your procedure, you may not have a normal bowel movement for a few days.  Please Note:  You might notice some irritation and congestion in your nose or some drainage.  This is from the oxygen used during your procedure.  There is no need for concern and it should clear up in a day or so.  SYMPTOMS TO REPORT IMMEDIATELY:  Following lower endoscopy (colonoscopy or flexible sigmoidoscopy):  Excessive amounts of blood in the stool  Significant tenderness or worsening of abdominal pains  Swelling of the abdomen that is new, acute  Fever of 100F or higher    For urgent or emergent issues, a gastroenterologist can be reached at any hour by calling [phone]. Do not use MyChart messaging for urgent concerns.    DIET:  We do recommend a small meal at first, but then you may  proceed to your regular diet.  Drink plenty of fluids but you should avoid alcoholic beverages for 24 hours.  ACTIVITY:  You should plan to take it easy for the rest of today and you should NOT DRIVE or use heavy machinery until tomorrow (because of the sedation medicines used during the test).    FOLLOW UP: Our staff will call the number listed on your records the next business day following your procedure.  We will call around 7:15- 8:00 am to check on you and address any questions or concerns that you may have regarding the information given to you following your procedure. If we do not reach you, we will leave a message.     If any biopsies were taken you will be contacted by phone or by letter within the next 1-3 weeks.  Please call us at [phone] if you have not heard about the biopsies in 3 weeks.    SIGNATURES/CONFIDENTIALITY: You and/or your care partner have signed paperwork which will be entered into your electronic medical record.  These signatures attest to the fact that that the information above on your After Visit Summary has been reviewed and is understood.  Full responsibility of the confidentiality of this discharge information lies with you and/or your care-partner.

## 2022-07-05 NOTE — Op Note (Signed)
[place] Patient Name: [name] Procedure Date: 07/05/2022 9:02 AM MRN: [id number] Endoscopist: Mauri Pole , MD, [id number] Age: 47 Referring MD:  Date of Birth: 08-Nov-1975 Gender: Female Account #: [id number] Procedure:                Colonoscopy Indications:              Screening for colorectal malignant neoplasm Medicines:                Monitored Anesthesia Care Procedure:                Pre-Anesthesia Assessment:                           - Prior to the procedure, a History and Physical                            was performed, and patient medications and                            allergies were reviewed. The patient's tolerance of                            previous anesthesia was also reviewed. The risks                            and benefits of the procedure and the sedation                            options and risks were discussed with the patient.                            All questions were answered, and informed consent                            was obtained. Prior Anticoagulants: The patient has                            taken no anticoagulant or antiplatelet agents. ASA                            Grade Assessment: II - A patient with mild systemic                            disease. After reviewing the risks and benefits,                            the patient was deemed in satisfactory condition to                            undergo the procedure.                           After obtaining informed consent, the colonoscope  was passed under direct vision. Throughout the                            procedure, the patient's blood pressure, pulse, and                            oxygen saturations were monitored continuously. The                            PCF-HQ190L Colonoscope [id number] was introduced                            through the anus and advanced to the the cecum,                            identified by  appendiceal orifice and ileocecal                            valve. The colonoscopy was performed without                            difficulty. The patient tolerated the procedure                            well. The quality of the bowel preparation was                            good. The ileocecal valve, appendiceal orifice, and                            rectum were photographed. Scope In: 9:11:48 AM Scope Out: 9:33:13 AM Scope Withdrawal Time: 0 hours 11 minutes 14 seconds  Total Procedure Duration: 0 hours 21 minutes 25 seconds  Findings:                 The perianal and digital rectal examinations were                            normal.                           Non-bleeding internal hemorrhoids were found during                            retroflexion. The hemorrhoids were small.                           The exam was otherwise without abnormality. Complications:            No immediate complications. Estimated Blood Loss:     Estimated blood loss: none. Impression:               - Non-bleeding internal hemorrhoids.                           - The examination was otherwise normal.                           -  No specimens collected. Recommendation:           - Patient has a contact number available for                            emergencies. The signs and symptoms of potential                            delayed complications were discussed with the                            patient. Return to normal activities tomorrow.                            Written discharge instructions were provided to the                            patient.                           - Resume previous diet.                           - Continue present medications.                           - Repeat colonoscopy in 10 years for surveillance. Mauri Pole, MD 07/05/2022 9:39:33 AM This report has been signed electronically.

## 2022-07-05 NOTE — Progress Notes (Signed)
VS completed by CW.   Pt's states no medical or surgical changes since previsit or office visit.  

## 2022-07-06 ENCOUNTER — Telehealth: Payer: Self-pay

## 2022-07-06 NOTE — Telephone Encounter (Signed)
Follow up call placed, VM obtained and message left. 

## 2022-07-10 ENCOUNTER — Other Ambulatory Visit: Payer: Self-pay | Admitting: Family Medicine

## 2022-07-10 DIAGNOSIS — R110 Nausea: Secondary | ICD-10-CM

## 2022-07-14 ENCOUNTER — Other Ambulatory Visit: Payer: Self-pay | Admitting: Family Medicine

## 2022-08-04 LAB — HM MAMMOGRAPHY

## 2022-08-17 ENCOUNTER — Other Ambulatory Visit: Payer: Self-pay | Admitting: Family Medicine

## 2022-08-17 DIAGNOSIS — R110 Nausea: Secondary | ICD-10-CM

## 2022-08-28 ENCOUNTER — Encounter: Payer: Self-pay | Admitting: *Deleted

## 2022-09-13 ENCOUNTER — Other Ambulatory Visit: Payer: Self-pay

## 2022-09-13 ENCOUNTER — Ambulatory Visit: Payer: BC Managed Care – PPO | Admitting: Family Medicine

## 2022-09-13 ENCOUNTER — Encounter: Payer: Self-pay | Admitting: Family Medicine

## 2022-09-13 VITALS — BP 118/76 | Ht 64.0 in | Wt 145.0 lb

## 2022-09-13 DIAGNOSIS — M25552 Pain in left hip: Secondary | ICD-10-CM

## 2022-09-13 DIAGNOSIS — M25571 Pain in right ankle and joints of right foot: Secondary | ICD-10-CM

## 2022-09-13 MED ORDER — METHYLPREDNISOLONE ACETATE 40 MG/ML IJ SUSP
40.0000 mg | Freq: Once | INTRAMUSCULAR | Status: AC
  Administered 2022-09-13: 40 mg via INTRA_ARTICULAR

## 2022-09-13 NOTE — Addendum Note (Signed)
Addended by: Merrilyn Puma on: 09/13/2022 04:32 PM   Modules accepted: Orders

## 2022-09-13 NOTE — Progress Notes (Signed)
  [name] - 47 y.o. female MRN [id number]  Date of birth: January 24, 1976  SUBJECTIVE:  Including CC & ROS.  No chief complaint on file.   [name] is a 47 y.o. female that is  presenting with acute left hip pain and acute right ankle pain. The left hip pain is similar to her previous type pain. Having lateral ankle pain for the past 5 months.    Review of Systems See HPI   HISTORY: Past Medical, Surgical, Social, and Family History Reviewed & Updated per EMR.   Pertinent Historical Findings include:  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   Procedure: LAPAROSCOPIC ROUX-EN-Y GASTRIC BYPASS WITH UPPER ENDOSCOPY;  Surgeon: Gaynelle Adu, MD;  Location: [facility];  Service: General;  Laterality: N/A;     PHYSICAL EXAM:  VS: BP 118/76 (BP Location: Left Arm, Patient Position: Sitting)   Ht 5\' 4"  (1.626 m)   Wt 145 lb (65.8 kg)   BMI 24.89 kg/m  Physical Exam Gen: NAD, alert, cooperative with exam, well-appearing MSK:  Neurovascularly intact     Aspiration/Injection Procedure Note [name] 05/17/1975  Procedure: Injection Indications: left hip pain  Procedure Details Consent: Risks of procedure as well as the alternatives and risks of each were explained to the (patient/caregiver).  Consent for procedure obtained. Time Out: Verified patient identification, verified procedure, site/side was marked, verified correct patient position, special equipment/implants available, medications/allergies/relevent history reviewed, required imaging and test results available.  Performed.  The area was cleaned with iodine and alcohol swabs.    The left trochanteric bursitis 1 cc of 40 mg depo-medrol and 4 cc's of 0.25%  bupivacaine was injected on a 22 gauge 3 1/2" needle.  Ultrasound was used. Images were obtained in long views showing the injection.     A sterile dressing was applied.  Patient did tolerate procedure well.     ASSESSMENT & PLAN:   Greater trochanteric pain syndrome of left lower extremity Acute on chronic in nature. Having exacerbation of her previous type pain. Has started working on strength.  - counseled on home exercise therapy and supportive care - injection today  - could consider further imaging or physical therapy  Sinus tarsi syndrome of right ankle Acutely occurring. Having excessive pronation with callus on the tibial side of the great toe. Likely leading to impingement  - counseled on home exercise therapy and supportive care - green sport insoles with first ray post to limit pronation  - could consider custom orthotics or injection.

## 2022-09-13 NOTE — Assessment & Plan Note (Signed)
Acute on chronic in nature. Having exacerbation of her previous type pain. Has started working on strength.  - counseled on home exercise therapy and supportive care - injection today  - could consider further imaging or physical therapy

## 2022-09-13 NOTE — Assessment & Plan Note (Signed)
Acutely occurring. Having excessive pronation with callus on the tibial side of the great toe. Likely leading to impingement  - counseled on home exercise therapy and supportive care - green sport insoles with first ray post to limit pronation  - could consider custom orthotics or injection.

## 2022-09-13 NOTE — Patient Instructions (Signed)
Good to see you Please try ice as needed  Please continue the exercises   Please send me a message in MyChart with any questions or updates.  Please see me back as needed.   --Dr. Elaura Calix  

## 2022-10-06 ENCOUNTER — Other Ambulatory Visit: Payer: Self-pay | Admitting: Family Medicine

## 2022-10-06 DIAGNOSIS — R110 Nausea: Secondary | ICD-10-CM

## 2022-10-06 MED ORDER — ONDANSETRON 4 MG PO TBDP: 4.0000 mg | ORAL_TABLET | Freq: Two times a day (BID) | ORAL | 0 refills | Status: DC

## 2022-10-28 ENCOUNTER — Other Ambulatory Visit: Payer: Self-pay | Admitting: Family Medicine

## 2022-10-28 DIAGNOSIS — K219 Gastro-esophageal reflux disease without esophagitis: Secondary | ICD-10-CM

## 2022-11-02 ENCOUNTER — Other Ambulatory Visit: Payer: Self-pay | Admitting: Family Medicine

## 2022-11-02 DIAGNOSIS — R110 Nausea: Secondary | ICD-10-CM

## 2022-11-16 ENCOUNTER — Other Ambulatory Visit: Payer: Self-pay | Admitting: Family Medicine

## 2022-11-16 DIAGNOSIS — F419 Anxiety disorder, unspecified: Secondary | ICD-10-CM

## 2022-12-08 ENCOUNTER — Ambulatory Visit (INDEPENDENT_AMBULATORY_CARE_PROVIDER_SITE_OTHER): Payer: BC Managed Care – PPO | Admitting: Family Medicine

## 2022-12-08 ENCOUNTER — Encounter: Payer: Self-pay | Admitting: Family Medicine

## 2022-12-08 VITALS — BP 108/82 | HR 77 | Temp 98.9°F | Resp 18 | Ht 64.0 in | Wt 150.6 lb

## 2022-12-08 DIAGNOSIS — Z0000 Encounter for general adult medical examination without abnormal findings: Secondary | ICD-10-CM | POA: Diagnosis not present

## 2022-12-08 DIAGNOSIS — F419 Anxiety disorder, unspecified: Secondary | ICD-10-CM | POA: Diagnosis not present

## 2022-12-08 DIAGNOSIS — Z9884 Bariatric surgery status: Secondary | ICD-10-CM | POA: Diagnosis not present

## 2022-12-08 LAB — COMPREHENSIVE METABOLIC PANEL
ALT: 19 U/L (ref 0–35)
AST: 21 U/L (ref 0–37)
Albumin: 4.4 g/dL (ref 3.5–5.2)
Alkaline Phosphatase: 62 U/L (ref 39–117)
BUN: 16 mg/dL (ref 6–23)
CO2: 33 mEq/L — ABNORMAL HIGH (ref 19–32)
Calcium: 9.9 mg/dL (ref 8.4–10.5)
Chloride: 98 mEq/L (ref 96–112)
Creatinine, Ser: 0.89 mg/dL (ref 0.40–1.20)
GFR: 76.06 mL/min (ref >60.00)
Glucose, Bld: 86 mg/dL (ref 70–99)
Potassium: 5 mEq/L (ref 3.5–5.1)
Sodium: 139 mEq/L (ref 135–145)
Total Bilirubin: 0.4 mg/dL (ref 0.2–1.2)
Total Protein: 6.5 g/dL (ref 6.0–8.3)

## 2022-12-08 LAB — LIPID PANEL
Cholesterol: 185 mg/dL (ref 0–200)
HDL: 84.8 mg/dL (ref >39.00)
LDL Cholesterol: 90 mg/dL (ref 0–99)
NonHDL: 100.12
Total CHOL/HDL Ratio: 2
Triglycerides: 49 mg/dL (ref 0.0–149.0)
VLDL: 9.8 mg/dL (ref 0.0–40.0)

## 2022-12-08 LAB — CBC WITH DIFFERENTIAL/PLATELET
Basophils Absolute: 0 10*3/uL (ref 0.0–0.1)
Basophils Relative: 1.2 % (ref 0.0–3.0)
Eosinophils Absolute: 0.2 10*3/uL (ref 0.0–0.7)
Eosinophils Relative: 4.4 % (ref 0.0–5.0)
HCT: 41.1 % (ref 36.0–46.0)
Hemoglobin: 13.5 g/dL (ref 12.0–15.0)
Lymphocytes Relative: 33.2 % (ref 12.0–46.0)
Lymphs Abs: 1.3 10*3/uL (ref 0.7–4.0)
MCHC: 32.9 g/dL (ref 30.0–36.0)
MCV: 95.7 fl (ref 78.0–100.0)
Monocytes Absolute: 0.3 10*3/uL (ref 0.1–1.0)
Monocytes Relative: 8.7 % (ref 3.0–12.0)
Neutro Abs: 2 10*3/uL (ref 1.4–7.7)
Neutrophils Relative %: 52.5 % (ref 43.0–77.0)
Platelets: 323 10*3/uL (ref 150.0–400.0)
RBC: 4.29 Mil/uL (ref 3.87–5.11)
RDW: 12.1 % (ref 11.5–15.5)
WBC: 3.8 10*3/uL — ABNORMAL LOW (ref 4.0–10.5)

## 2022-12-08 LAB — TSH: TSH: 2.34 u[IU]/mL (ref 0.35–5.50)

## 2022-12-08 LAB — HEMOGLOBIN A1C: Hgb A1c MFr Bld: 5.2 % (ref 4.6–6.5)

## 2022-12-08 LAB — VITAMIN B12: Vitamin B-12: 793 pg/mL (ref 211–911)

## 2022-12-08 LAB — VITAMIN D 25 HYDROXY (VIT D DEFICIENCY, FRACTURES): VITD: 74.13 ng/mL (ref 30.00–100.00)

## 2022-12-08 MED ORDER — VILAZODONE HCL 40 MG PO TABS
40.0000 mg | ORAL_TABLET | Freq: Every day | ORAL | 1 refills | Status: DC
Start: 2022-12-08 — End: 2023-06-04

## 2022-12-08 NOTE — Assessment & Plan Note (Signed)
Ghm utd Check labs  See AVS Health Maintenance  Topic Date Due   COVID-19 Vaccine (5 - 2023-24 season) 12/24/2022 (Originally 01/13/2022)   INFLUENZA VACCINE  12/14/2022   MAMMOGRAM  08/04/2023   PAP SMEAR-Modifier  07/25/2024   DTaP/Tdap/Td (4 - Td or Tdap) 12/07/2031   Colonoscopy  07/05/2032   Hepatitis C Screening  Completed   HPV VACCINES  Aged Out   HIV Screening  Discontinued

## 2022-12-08 NOTE — Progress Notes (Signed)
Established Patient Office Visit  Subjective   Patient ID: AMEN MIKULA, female    DOB: [birth year]  Age: 47 y.o. MRN: [id number]  Chief Complaint  Patient presents with   Annual Exam    Pt states fasting     HPI Discussed the use of AI scribe software for clinical note transcription with the patient, who gave verbal consent to proceed.  History of Present Illness   The patient, with a history of obesity and depression, presents for a routine follow-up. She has successfully lost a significant amount of weight, which she attributes to a combination of bariatric surgery and the use of a weight loss medication. However, she expresses concern about the potential discontinuation of her weight loss medication due to changes in her insurance coverage. She reports that she has been able to maintain her weight loss for the past several months, but worries about potential weight gain if she has to stop taking the medication.  In addition to her weight loss journey, the patient also discusses her mental health. She is currently taking an antidepressant, Trintellix, but expresses dissatisfaction with its effects. She reports high levels of anxiety and difficulty sleeping, which she manages with occasional use of Xanax. She also mentions that she is considering changing her antidepressant medication, but is frustrated with the process of finding a new psychiatrist.  The patient also discusses her family situation, specifically her son's struggles with suicidal ideation. Her son is currently on medication and in therapy, and the patient reports that his condition has improved significantly.      Patient Active Problem List   Diagnosis Date Noted   Sinus tarsi syndrome of right ankle 09/13/2022   Greater trochanteric pain syndrome of left lower extremity 03/27/2022   Nausea 02/24/2022   Other fatigue 12/02/2020   Lumbar radiculopathy 01/05/2020   Piriformis syndrome of right side 12/11/2019    Greater trochanteric pain syndrome of right lower extremity 11/07/2019   Right foot pain 07/25/2019   Muscle spasm of shoulder region 04/22/2019   Abdominal pain 10/08/2018   Left leg pain 11/05/2017   Pain of right thumb 11/05/2017   Preventative health care 11/05/2017   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   Lumbar disc disease 08/28/2016   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   Referred otalgia of left ear 07/12/2015   Temporomandibular joint (TMJ) pain 07/12/2015   Acute maxillary sinusitis 06/09/2015   Acute left otitis media 06/09/2015   Knee pain, left 03/22/2015   Conjunctivitis 03/27/2014   Ear pain 03/27/2014   Anxiety state 06/02/2013   Vitamin B-complex deficiency 10/16/2008   Depression, major, single episode, moderate (HCC) 09/04/2006   Disorder of function of stomach 09/04/2006   COLPOSCOPY, HX OF 09/04/2006   Gastroesophageal reflux disease 09/04/2006   Depression 09/04/2006   Past Medical History:  Diagnosis Date   Anxiety    Asthma    Depression    Gallstones    GERD (gastroesophageal reflux disease)    RAD (reactive airway disease)    Past Surgical History:  Procedure Laterality Date   CESAREAN SECTION     CHOLECYSTECTOMY  2007   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   Procedure: LAPAROSCOPIC ROUX-EN-Y GASTRIC BYPASS WITH UPPER ENDOSCOPY;  Surgeon: Gaynelle Adu, MD;  Location: [facility];  Service: General;  Laterality: N/A;   Social History   Tobacco Use   Smoking status: Never   Smokeless tobacco: Never  Vaping Use   Vaping status: Never Used  Substance Use Topics  Alcohol use: Yes    Comment: rare   Drug use: No   Social History   Socioeconomic History   Marital status: Married    Spouse name: Not on file   Number of children: 2   Years of education: Not on file   Highest education level: Not on file  Occupational History   Occupation: Museum/gallery curator: [employer]  Tobacco Use   Smoking status: Never   Smokeless tobacco: Never  Vaping  Use   Vaping status: Never Used  Substance and Sexual Activity   Alcohol use: Yes    Comment: rare   Drug use: No   Sexual activity: Yes  Other Topics Concern   Not on file  Social History Narrative   Exercising 4-5 days a week    Social Determinants of Health   Financial Resource Strain: Not on file  Food Insecurity: Not on file  Transportation Needs: Not on file  Physical Activity: Not on file  Stress: Not on file  Social Connections: Unknown (09/23/2021)   [facility]   Social Network    Social Network: Not on file  Intimate Partner Violence: Unknown (08/16/2021)   Received from [facility]   HITS    Physically Hurt: Not on file    Insult or Talk Down To: Not on file    Threaten Physical Harm: Not on file    Scream or Curse: Not on file   Family Status  Relation Name Status   Mother  Alive   Father  Alive   MGM  (Not Specified)   MGF  Deceased at age 39   PGM  Alive   Cousin  Alive   Neg Hx  (Not Specified)  No partnership data on file   Family History  Problem Relation Age of Onset   Breast cancer Mother    Hypertension Mother    Skin cancer Mother    Colon polyps Father    Hypertension Father    Skin cancer Father    Diabetes Maternal Grandmother    Colon cancer Maternal Grandfather    Pancreatic cancer Maternal Grandfather    Arthritis Paternal Grandmother    BRCA 1/2 Paternal Grandmother    BRCA 1/2 Cousin    Esophageal cancer Neg Hx    Rectal cancer Neg Hx    Stomach cancer Neg Hx    Allergies  Allergen Reactions   Amoxicillin Hives    Has patient had a PCN reaction causing immediate rash, facial/tongue/throat swelling, SOB or lightheadedness with hypotension:No Has patient had a PCN reaction causing severe rash involving mucus membranes or skin necrosis:unsure--doesn't think so Has patient had a PCN reaction that required hospitalization:No Has patient had a PCN reaction occurring within the last 10 years:No If all of the  above answers are "NO", then may proceed with Cephalosporin use.    Labetalol Hcl Other (See Comments)    Asthma   Sulfonamide Derivatives Hives    Kidney Disease      ROS    Objective:     BP 108/82 (BP Location: Left Arm, Patient Position: Sitting, Cuff Size: Normal)   Pulse 77   Temp 98.9 F (37.2 C) (Oral)   Resp 18   Ht 5\' 4"  (1.626 m)   Wt 150 lb 9.6 oz (68.3 kg)   SpO2 97%   BMI 25.85 kg/m  BP Readings from Last 3 Encounters:  12/08/22 108/82  09/13/22 118/76  07/05/22 130/62   Wt  Readings from Last 3 Encounters:  12/08/22 150 lb 9.6 oz (68.3 kg)  09/13/22 145 lb (65.8 kg)  07/05/22 143 lb (64.9 kg)   SpO2 Readings from Last 3 Encounters:  12/08/22 97%  07/05/22 97%  12/06/21 99%      Physical Exam Vitals and nursing note reviewed.  Constitutional:      General: She is not in acute distress.    Appearance: Normal appearance. She is well-developed.  HENT:     Head: Normocephalic and atraumatic.     Right Ear: Tympanic membrane, ear canal and external ear normal. There is no impacted cerumen.     Left Ear: Tympanic membrane, ear canal and external ear normal. There is no impacted cerumen.     Nose: Nose normal.     Mouth/Throat:     Mouth: Mucous membranes are moist.     Pharynx: Oropharynx is clear. No oropharyngeal exudate or posterior oropharyngeal erythema.  Eyes:     General: No scleral icterus.       Right eye: No discharge.        Left eye: No discharge.     Conjunctiva/sclera: Conjunctivae normal.     Pupils: Pupils are equal, round, and reactive to light.  Neck:     Thyroid: No thyromegaly or thyroid tenderness.     Vascular: No JVD.  Cardiovascular:     Rate and Rhythm: Normal rate and regular rhythm.     Heart sounds: Normal heart sounds. No murmur heard. Pulmonary:     Effort: Pulmonary effort is normal. No respiratory distress.     Breath sounds: Normal breath sounds.  Abdominal:     General: Bowel sounds are normal. There is no  distension.     Palpations: Abdomen is soft. There is no mass.     Tenderness: There is no abdominal tenderness. There is no guarding or rebound.  Genitourinary:    Vagina: Normal.  Musculoskeletal:        General: Normal range of motion.     Cervical back: Normal range of motion and neck supple.     Right lower leg: No edema.     Left lower leg: No edema.  Lymphadenopathy:     Cervical: No cervical adenopathy.  Skin:    General: Skin is warm and dry.     Findings: No erythema or rash.  Neurological:     General: No focal deficit present.     Mental Status: She is alert and oriented to person, place, and time.     Cranial Nerves: No cranial nerve deficit.     Deep Tendon Reflexes: Reflexes are normal and symmetric.  Psychiatric:        Mood and Affect: Mood normal.        Behavior: Behavior normal.        Thought Content: Thought content normal.        Judgment: Judgment normal.      No results found for any visits on 12/08/22.  Last CBC Lab Results  Component Value Date   WBC 6.9 12/06/2021   HGB 13.4 12/06/2021   HCT 38.9 12/06/2021   MCV 94.3 12/06/2021   MCH 33.1 12/09/2020   RDW 12.3 12/06/2021   PLT 312.0 12/06/2021   Last metabolic panel Lab Results  Component Value Date   GLUCOSE 80 12/06/2021   NA 135 12/06/2021   K 4.3 12/06/2021   CL 96 12/06/2021   CO2 32 12/06/2021   BUN 16 12/06/2021  CREATININE 0.85 12/06/2021   GFR 80.94 12/06/2021   CALCIUM 9.4 12/06/2021   PROT 6.6 12/06/2021   ALBUMIN 4.4 12/06/2021   BILITOT 0.4 12/06/2021   ALKPHOS 57 12/06/2021   AST 20 12/06/2021   ALT 16 12/06/2021   ANIONGAP 8 12/09/2020   Last lipids Lab Results  Component Value Date   CHOL 157 12/06/2021   HDL 69.20 12/06/2021   LDLCALC 74 12/06/2021   LDLDIRECT 154.3 07/28/2010   TRIG 67.0 12/06/2021   CHOLHDL 2 12/06/2021   Last hemoglobin A1c Lab Results  Component Value Date   HGBA1C 5.3 12/06/2021   Last thyroid functions Lab Results   Component Value Date   TSH 2.04 12/06/2021   Last vitamin D Lab Results  Component Value Date   VD25OH 61.31 12/02/2020   Last vitamin B12 and Folate Lab Results  Component Value Date   VITAMINB12 915 (H) 12/02/2020   FOLATE 16.3 10/02/2008      The 10-year ASCVD risk score (Arnett DK, et al., 2019) is: 0.4%    Assessment & Plan:   Problem List Items Addressed This Visit       Unprioritized   S/P gastric bypass   Relevant Orders   CBC with Differential/Platelet   Comprehensive metabolic panel   Lipid panel   TSH   Hemoglobin A1c   Insulin, random   Preventative health care - Primary    Ghm utd Check labs  See AVS Health Maintenance  Topic Date Due   COVID-19 Vaccine (5 - 2023-24 season) 12/24/2022 (Originally 01/13/2022)   INFLUENZA VACCINE  12/14/2022   MAMMOGRAM  08/04/2023   PAP SMEAR-Modifier  07/25/2024   DTaP/Tdap/Td (4 - Td or Tdap) 12/07/2031   Colonoscopy  07/05/2032   Hepatitis C Screening  Completed   HPV VACCINES  Aged Out   HIV Screening  Discontinued         Relevant Orders   CBC with Differential/Platelet   Comprehensive metabolic panel   Lipid panel   TSH   Hemoglobin A1c   Insulin, random   Vitamin B12   VITAMIN D 25 Hydroxy (Vit-D Deficiency, Fractures)   RESOLVED: Morbid obesity (HCC)   Other Visit Diagnoses     Anxiety       Relevant Medications   Vilazodone HCl (VIIBRYD) 40 MG TABS     Assessment and Plan    Weight Management: Patient has been maintaining weight within a 5-pound range for the past 3-4 months. Concerns about discontinuation of Wegovy due to insurance coverage. Discussed potential changes in insurance coverage and the possibility of the medication being covered under a different indication. -Continue current weight management strategies. -Check insurance formulary periodically for changes in coverage for [facility].  Depression/Anxiety: Patient reports high anxiety and trouble sleeping. Currently on  Trintellix. Discussed potential switch back to [place] if insurance coverage allows. -Sent prescription for Viibryd to see if it is covered by insurance. -If Lorin Picket is covered, patient can switch from Trintellix to [place].  General Health Maintenance: -Eligible for shingles vaccine at age 15.        No follow-ups on file.    Donato Schultz, DO

## 2022-12-14 ENCOUNTER — Other Ambulatory Visit: Payer: Self-pay | Admitting: Family Medicine

## 2022-12-14 DIAGNOSIS — F419 Anxiety disorder, unspecified: Secondary | ICD-10-CM

## 2022-12-14 NOTE — Telephone Encounter (Signed)
Requesting: alprazolam 0.5mg   Contract: None UDS: None Last Visit: 12/08/22 Next Visit: None Last Refill: 07/03/22 #60 and 0RF   Please Advise

## 2023-03-01 ENCOUNTER — Encounter: Payer: Self-pay | Admitting: Family Medicine

## 2023-03-04 ENCOUNTER — Other Ambulatory Visit: Payer: Self-pay | Admitting: Family Medicine

## 2023-03-04 DIAGNOSIS — F419 Anxiety disorder, unspecified: Secondary | ICD-10-CM

## 2023-03-05 NOTE — Telephone Encounter (Signed)
Requesting: alprazolam 0.5mg   Contract: None UDS: None Last Visit: 12/08/22 Next Visit: None Last Refill: 12/14/22 #60 and 0RF  Please Advise

## 2023-03-30 ENCOUNTER — Encounter (HOSPITAL_COMMUNITY): Payer: Self-pay | Admitting: *Deleted

## 2023-04-18 ENCOUNTER — Other Ambulatory Visit (HOSPITAL_BASED_OUTPATIENT_CLINIC_OR_DEPARTMENT_OTHER): Payer: Self-pay

## 2023-04-18 ENCOUNTER — Ambulatory Visit: Payer: BC Managed Care – PPO | Admitting: Physician Assistant

## 2023-04-18 ENCOUNTER — Encounter: Payer: Self-pay | Admitting: Physician Assistant

## 2023-04-18 VITALS — BP 137/88 | HR 66 | Temp 97.6°F | Ht 64.0 in | Wt 160.2 lb

## 2023-04-18 DIAGNOSIS — J0100 Acute maxillary sinusitis, unspecified: Secondary | ICD-10-CM | POA: Diagnosis not present

## 2023-04-18 MED ORDER — DOXYCYCLINE HYCLATE 100 MG PO TABS
100.0000 mg | ORAL_TABLET | Freq: Two times a day (BID) | ORAL | 0 refills | Status: AC
  Filled 2023-04-18: qty 14, 7d supply, fill #0

## 2023-04-18 NOTE — Progress Notes (Signed)
Established patient visit   Patient: [name]   DOB: [birth year]   47 y.o. Female  MRN: [id number] Visit Date: 04/18/2023  Today's healthcare provider: Alfredia Ferguson, PA-C   Cc. Sinus congestion, headache  Subjective     Pt reports nasal congestion, headache, bloody nasal discharge x 1-2 weeks. Denies fever Using zyrtec, flonase.   Medications: Outpatient Medications Prior to Visit  Medication Sig   ALPRAZolam (XANAX) 0.5 MG tablet TAKE 1 TABLET BY MOUTH TWICE A DAY AS NEEDED FOR ANXIETY   fluticasone (FLONASE) 50 MCG/ACT nasal spray Place 2 sprays into both nostrils at bedtime.   levonorgestrel (MIRENA) 20 MCG/24HR IUD 1 each by Intrauterine route once.   NONFORMULARY OR COMPOUNDED ITEM Bariatric multivitamin 1 po qd   omeprazole (PRILOSEC) 40 MG capsule Take 1 capsule (40 mg total) by mouth daily.   ondansetron (ZOFRAN-ODT) 4 MG disintegrating tablet TAKE 1 TABLET BY MOUTH 2 TIMES DAILY.   Probiotic Product (PROBIOTIC PO) Take by mouth.   Semaglutide-Weight Management (WEGOVY) 2.4 MG/0.75ML SOAJ INJECT 2.4 MG INTO THE SKIN ONCE A WEEK.   tretinoin (RETIN-A) 0.05 % cream APPLY TO FACE ONCE DAILY  AT BEDTIME   Vilazodone HCl (VIIBRYD) 40 MG TABS Take 1 tablet (40 mg total) by mouth daily.   No facility-administered medications prior to visit.    Review of Systems  Constitutional:  Negative for fatigue and fever.  HENT:  Positive for congestion.   Respiratory:  Negative for cough and shortness of breath.   Cardiovascular:  Negative for chest pain and leg swelling.  Gastrointestinal:  Negative for abdominal pain.  Neurological:  Positive for headaches. Negative for dizziness.       Objective    BP 137/88   Pulse 66   Temp 97.6 F (36.4 C) (Oral)   Ht 5\' 4"  (1.626 m)   Wt 160 lb 4 oz (72.7 kg)   SpO2 100%   BMI 27.51 kg/m    Physical Exam Constitutional:      General: She is awake.     Appearance: She is well-developed.  HENT:     Head:  Normocephalic.     Right Ear: Tympanic membrane normal.     Left Ear: Tympanic membrane normal.     Nose: Congestion present.     Mouth/Throat:     Pharynx: Posterior oropharyngeal erythema present.  Eyes:     Conjunctiva/sclera: Conjunctivae normal.  Cardiovascular:     Rate and Rhythm: Normal rate and regular rhythm.     Heart sounds: Normal heart sounds.  Pulmonary:     Effort: Pulmonary effort is normal.     Breath sounds: Normal breath sounds.  Skin:    General: Skin is warm.  Neurological:     Mental Status: She is alert and oriented to person, place, and time.  Psychiatric:        Attention and Perception: Attention normal.        Mood and Affect: Mood normal.        Speech: Speech normal.        Behavior: Behavior is cooperative.      No results found for any visits on 04/18/23.  Assessment & Plan    Acute non-recurrent maxillary sinusitis -     Doxycycline Hyclate; Take 1 tablet (100 mg total) by mouth 2 (two) times daily for 7 days.  Dispense: 14 tablet; Refill: 0   Recommend rest, hydration, cont zyrtec and flonase, saline nasal spray otc.  Rx doxy bid x 7 days, pcn allergy  Return if symptoms worsen or fail to improve.       Alfredia Ferguson, PA-C  [facility] [phone] (phone) [phone] (fax)  [facility]

## 2023-05-02 ENCOUNTER — Other Ambulatory Visit: Payer: Self-pay | Admitting: Family Medicine

## 2023-05-02 DIAGNOSIS — K219 Gastro-esophageal reflux disease without esophagitis: Secondary | ICD-10-CM

## 2023-06-03 ENCOUNTER — Other Ambulatory Visit: Payer: Self-pay | Admitting: Family Medicine

## 2023-06-03 DIAGNOSIS — F419 Anxiety disorder, unspecified: Secondary | ICD-10-CM

## 2023-08-14 ENCOUNTER — Other Ambulatory Visit: Payer: Self-pay | Admitting: Family Medicine

## 2023-08-14 DIAGNOSIS — F419 Anxiety disorder, unspecified: Secondary | ICD-10-CM

## 2023-08-14 NOTE — Telephone Encounter (Signed)
 Requesting: Xanax Contract: n/a UDS: n/a Last OV: 04/18/23 Next OV: n/a Last Refill: 03/05/2023 Database:   Please advise

## 2023-08-17 LAB — HM MAMMOGRAPHY

## 2023-10-28 ENCOUNTER — Other Ambulatory Visit: Payer: Self-pay | Admitting: Family Medicine

## 2023-10-28 DIAGNOSIS — K219 Gastro-esophageal reflux disease without esophagitis: Secondary | ICD-10-CM

## 2023-12-01 ENCOUNTER — Other Ambulatory Visit: Payer: Self-pay | Admitting: Family Medicine

## 2023-12-01 DIAGNOSIS — F419 Anxiety disorder, unspecified: Secondary | ICD-10-CM

## 2023-12-03 ENCOUNTER — Encounter: Payer: Self-pay | Admitting: Family Medicine

## 2023-12-10 ENCOUNTER — Ambulatory Visit (INDEPENDENT_AMBULATORY_CARE_PROVIDER_SITE_OTHER): Admitting: Family Medicine

## 2023-12-10 ENCOUNTER — Encounter: Payer: Self-pay | Admitting: Family Medicine

## 2023-12-10 VITALS — BP 124/82 | HR 72 | Temp 98.4°F | Resp 16 | Ht 64.0 in | Wt 160.0 lb

## 2023-12-10 DIAGNOSIS — Z23 Encounter for immunization: Secondary | ICD-10-CM | POA: Diagnosis not present

## 2023-12-10 DIAGNOSIS — E663 Overweight: Secondary | ICD-10-CM

## 2023-12-10 DIAGNOSIS — F419 Anxiety disorder, unspecified: Secondary | ICD-10-CM | POA: Diagnosis not present

## 2023-12-10 DIAGNOSIS — Z0000 Encounter for general adult medical examination without abnormal findings: Secondary | ICD-10-CM | POA: Diagnosis not present

## 2023-12-10 DIAGNOSIS — Z13220 Encounter for screening for lipoid disorders: Secondary | ICD-10-CM

## 2023-12-10 DIAGNOSIS — F411 Generalized anxiety disorder: Secondary | ICD-10-CM

## 2023-12-10 LAB — LIPID PANEL
Cholesterol: 184 mg/dL (ref 0–200)
HDL: 81.6 mg/dL (ref >39.00)
LDL Cholesterol: 92 mg/dL (ref 0–99)
NonHDL: 102.74
Total CHOL/HDL Ratio: 2
Triglycerides: 56 mg/dL (ref 0.0–149.0)
VLDL: 11.2 mg/dL (ref 0.0–40.0)

## 2023-12-10 LAB — COMPREHENSIVE METABOLIC PANEL WITH GFR
ALT: 16 U/L (ref 0–35)
AST: 20 U/L (ref 0–37)
Albumin: 4.2 g/dL (ref 3.5–5.2)
Alkaline Phosphatase: 60 U/L (ref 39–117)
BUN: 9 mg/dL (ref 6–23)
CO2: 32 meq/L (ref 19–32)
Calcium: 9.6 mg/dL (ref 8.4–10.5)
Chloride: 101 meq/L (ref 96–112)
Creatinine, Ser: 0.84 mg/dL (ref 0.40–1.20)
GFR: 80.95 mL/min (ref >60.00)
Glucose, Bld: 88 mg/dL (ref 70–99)
Potassium: 5.7 meq/L — ABNORMAL HIGH (ref 3.5–5.1)
Sodium: 138 meq/L (ref 135–145)
Total Bilirubin: 0.5 mg/dL (ref 0.2–1.2)
Total Protein: 6.2 g/dL (ref 6.0–8.3)

## 2023-12-10 LAB — CBC WITH DIFFERENTIAL/PLATELET
Basophils Absolute: 0.1 K/uL (ref 0.0–0.1)
Basophils Relative: 1.7 % (ref 0.0–3.0)
Eosinophils Absolute: 0.2 K/uL (ref 0.0–0.7)
Eosinophils Relative: 4.7 % (ref 0.0–5.0)
HCT: 37.8 % (ref 36.0–46.0)
Hemoglobin: 13 g/dL (ref 12.0–15.0)
Lymphocytes Relative: 33.3 % (ref 12.0–46.0)
Lymphs Abs: 1.2 K/uL (ref 0.7–4.0)
MCHC: 34.4 g/dL (ref 30.0–36.0)
MCV: 93.4 fl (ref 78.0–100.0)
Monocytes Absolute: 0.3 K/uL (ref 0.1–1.0)
Monocytes Relative: 7.5 % (ref 3.0–12.0)
Neutro Abs: 1.9 K/uL (ref 1.4–7.7)
Neutrophils Relative %: 52.8 % (ref 43.0–77.0)
Platelets: 300 K/uL (ref 150.0–400.0)
RBC: 4.05 Mil/uL (ref 3.87–5.11)
RDW: 12.1 % (ref 11.5–15.5)
WBC: 3.6 K/uL — ABNORMAL LOW (ref 4.0–10.5)

## 2023-12-10 MED ORDER — TIRZEPATIDE-WEIGHT MANAGEMENT 2.5 MG/0.5ML ~~LOC~~ SOLN: 2.5000 mg | SUBCUTANEOUS | 0 refills | Status: DC

## 2023-12-10 NOTE — Assessment & Plan Note (Signed)
 Ghm utd Check labs See AVS Health Maintenance  Topic Date Due   Hepatitis B Vaccines (1 of 3 - 19+ 3-dose series) Never done   Cervical Cancer Screening (HPV/Pap Cotest)  03/03/2014   INFLUENZA VACCINE  12/14/2023   Zoster Vaccines- Shingrix  (2 of 2) 02/04/2024   MAMMOGRAM  08/16/2024   DTaP/Tdap/Td (4 - Td or Tdap) 12/07/2031   Colonoscopy  07/05/2032   COVID-19 Vaccine  Completed   Hepatitis C Screening  Completed   HPV VACCINES  Aged Out   Meningococcal B Vaccine  Aged Out   HIV Screening  Discontinued

## 2023-12-10 NOTE — Progress Notes (Signed)
 Subjective:    Patient ID: [name], female    DOB: 30-Jan-1976, 48 y.o.   MRN: [id number]  Chief Complaint  Patient presents with   Annual Exam    HPI Patient is in today for cpe.  Discussed the use of AI scribe software for clinical note transcription with the patient, who gave verbal consent to proceed.  History of Present Illness [name] is a 48 year old female who presents with concerns about weight management and sleep issues.  She is frustrated with her current weight and is interested in medication options like Wegovy . She has researched various programs and medications, including the [employer], and is considering options for weight loss. She desires to reach a weight of 150 pounds, which she considers her 'happy place'.  She experiences symptoms related to perimenopause or menopause, including tiredness and mood swings. She has discussed these symptoms with her OB, who mentioned that testing for menopause is not feasible due to her IUD, which is due for removal next year.  She is currently taking Viibryd  and uses Xanax , half to one tablet most nights, to aid sleep. She is concerned about using Tylenol  PM and sometimes uses Benadryl  for nasal congestion at night. She experiences anxiety that affects her sleep, leading to the use of Xanax  four out of seven nights a week.  She suspects she may have inherited restless leg syndrome from her mother and uses magnesium supplements and electrolytes to manage leg cramps. She also drinks diet tonic water and has tried various home remedies for leg cramps.  She maintains an active lifestyle, exercising four to five days a week, including running and attending workout classes. She participates in a program called [employer], which provides personal training sessions twice a week and group classes. She runs a couple of times a week, although the current heat limits outdoor activities.  Her family history includes her father  having a heart attack scare and both parents experiencing health issues. Her father is 57 and her mother is 42. She has provided them with [employer] Watches for safety after her father experienced a fall.    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   Procedure: LAPAROSCOPIC ROUX-EN-Y GASTRIC BYPASS WITH UPPER ENDOSCOPY;  Surgeon: Camellia Blush, MD;  Location: [facility];  Service: General;  Laterality: N/A;    Family History  Problem Relation Age of Onset   Breast cancer Mother    Hypertension Mother    Skin cancer Mother    Colon polyps Father    Hypertension Father    Skin cancer Father    Diabetes Maternal Grandmother    Colon cancer Maternal Grandfather    Pancreatic cancer Maternal Grandfather    Arthritis Paternal Grandmother    BRCA 1/2 Paternal Grandmother    BRCA 1/2 Cousin    Esophageal cancer Neg Hx    Rectal cancer Neg Hx    Stomach cancer Neg Hx     Social History   Socioeconomic History   Marital status: Married    Spouse name: Not on file   Number of children: 2   Years of education: Not on file  Highest education level: Bachelor's degree (e.g., BA, AB, BS)  Occupational History   Occupation: Museum/gallery curator: [employer]  Tobacco Use   Smoking status: Never   Smokeless tobacco: Never  Vaping Use   Vaping status: Never Used  Substance and Sexual Activity   Alcohol use: Yes    Comment: rare   Drug use: No   Sexual activity: Yes  Other Topics Concern   Not on file  Social History Narrative   Exercising 4-5 days a week    Social Drivers of Health   Financial Resource Strain: Low Risk  (12/09/2023)   Overall Financial Resource Strain (CARDIA)    Difficulty of Paying Living Expenses:  Not very hard  Food Insecurity: No Food Insecurity (12/09/2023)   Hunger Vital Sign    Worried About Running Out of Food in the Last Year: Never true    Ran Out of Food in the Last Year: Never true  Transportation Needs: No Transportation Needs (12/09/2023)   PRAPARE - Administrator, Civil Service (Medical): No    Lack of Transportation (Non-Medical): No  Physical Activity: Sufficiently Active (12/09/2023)   Exercise Vital Sign    Days of Exercise per Week: 4 days    Minutes of Exercise per Session: 40 min  Stress: Stress Concern Present (12/09/2023)   [employer] of Occupational Health - Occupational Stress Questionnaire    Feeling of Stress: Rather much  Social Connections: Moderately Isolated (12/09/2023)   Social Connection and Isolation Panel    Frequency of Communication with Friends and Family: More than three times a week    Frequency of Social Gatherings with Friends and Family: Twice a week    Attends Religious Services: Never    Database administrator or Organizations: No    Attends Engineer, structural: Not on file    Marital Status: Married  Intimate Partner Violence: Unknown (08/16/2021)   Received from [facility]   HITS    Physically Hurt: Not on file    Insult or Talk Down To: Not on file    Threaten Physical Harm: Not on file    Scream or Curse: Not on file    Outpatient Medications Prior to Visit  Medication Sig Dispense Refill   ALPRAZolam  (XANAX ) 0.5 MG tablet TAKE 1 TABLET BY MOUTH TWICE A DAY AS NEEDED FOR ANXIETY 60 tablet 0   fluticasone  (FLONASE ) 50 MCG/ACT nasal spray Place 2 sprays into both nostrils at bedtime. 48 g 0   levonorgestrel (MIRENA) 20 MCG/24HR IUD 1 each by Intrauterine route once.     NONFORMULARY OR COMPOUNDED ITEM Bariatric multivitamin 1 po qd 90 each 3   omeprazole  (PRILOSEC) 40 MG capsule Take 1 capsule (40 mg total) by mouth daily. 90 capsule 0   Probiotic Product (PROBIOTIC PO) Take by mouth.      tretinoin (RETIN-A) 0.05 % cream APPLY TO FACE ONCE DAILY  AT BEDTIME  3   Vilazodone  HCl (VIIBRYD ) 40 MG TABS Take 1 tablet (40 mg total) by mouth daily. 90 tablet 0   ondansetron  (ZOFRAN -ODT) 4 MG disintegrating tablet TAKE 1 TABLET BY MOUTH 2 TIMES DAILY. 60 tablet 0   Semaglutide -Weight Management (WEGOVY ) 2.4 MG/0.75ML SOAJ INJECT 2.4 MG INTO THE SKIN ONCE A WEEK. 3 mL 3   No facility-administered medications prior to visit.    Allergies  Allergen Reactions   Amoxicillin Hives    Has patient had a PCN reaction causing immediate rash, facial/tongue/throat  swelling, SOB or lightheadedness with hypotension:No Has patient had a PCN reaction causing severe rash involving mucus membranes or skin necrosis:unsure--doesn't think so Has patient had a PCN reaction that required hospitalization:No Has patient had a PCN reaction occurring within the last 10 years:No If all of the above answers are NO, then may proceed with Cephalosporin use.    Labetalol Hcl Other (See Comments)    Asthma   Sulfonamide Derivatives Hives    Kidney Disease    Review of Systems  Constitutional:  Negative for fever and malaise/fatigue.  HENT:  Negative for congestion.   Eyes:  Negative for blurred vision.  Respiratory:  Negative for cough and shortness of breath.   Cardiovascular:  Negative for chest pain, palpitations and leg swelling.  Gastrointestinal:  Negative for abdominal pain, blood in stool, nausea and vomiting.  Genitourinary:  Negative for dysuria and frequency.  Musculoskeletal:  Negative for back pain and falls.  Skin:  Negative for rash.  Neurological:  Negative for dizziness, loss of consciousness and headaches.  Endo/Heme/Allergies:  Negative for environmental allergies.  Psychiatric/Behavioral:  Negative for depression. The patient is not nervous/anxious.        Objective:    Physical Exam Vitals and nursing note reviewed.  Constitutional:      General: She is not in acute  distress.    Appearance: Normal appearance. She is well-developed.  HENT:     Head: Normocephalic and atraumatic.     Right Ear: Tympanic membrane, ear canal and external ear normal. There is no impacted cerumen.     Left Ear: Tympanic membrane, ear canal and external ear normal. There is no impacted cerumen.     Nose: Nose normal.     Mouth/Throat:     Mouth: Mucous membranes are moist.     Pharynx: Oropharynx is clear. No oropharyngeal exudate or posterior oropharyngeal erythema.  Eyes:     General: No scleral icterus.       Right eye: No discharge.        Left eye: No discharge.     Conjunctiva/sclera: Conjunctivae normal.     Pupils: Pupils are equal, round, and reactive to light.  Neck:     Thyroid : No thyromegaly or thyroid  tenderness.     Vascular: No JVD.  Cardiovascular:     Rate and Rhythm: Normal rate and regular rhythm.     Heart sounds: Normal heart sounds. No murmur heard. Pulmonary:     Effort: Pulmonary effort is normal. No respiratory distress.     Breath sounds: Normal breath sounds.  Abdominal:     General: Bowel sounds are normal. There is no distension.     Palpations: Abdomen is soft. There is no mass.     Tenderness: There is no abdominal tenderness. There is no guarding or rebound.  Musculoskeletal:        General: Normal range of motion.     Cervical back: Normal range of motion and neck supple.     Right lower leg: No edema.     Left lower leg: No edema.  Lymphadenopathy:     Cervical: No cervical adenopathy.  Skin:    General: Skin is warm and dry.     Findings: No erythema or rash.  Neurological:     Mental Status: She is alert and oriented to person, place, and time.     Cranial Nerves: No cranial nerve deficit.     Deep Tendon Reflexes: Reflexes are normal and symmetric.  Psychiatric:  Mood and Affect: Mood normal.        Behavior: Behavior normal.        Thought Content: Thought content normal.        Judgment: Judgment normal.      BP 124/82 (BP Location: Right Arm, Patient Position: Sitting, Cuff Size: Normal)   Pulse 72   Temp 98.4 F (36.9 C) (Oral)   Resp 16   Ht 5' 4 (1.626 m)   Wt 160 lb (72.6 kg)   SpO2 100%   BMI 27.46 kg/m  Wt Readings from Last 3 Encounters:  12/10/23 160 lb (72.6 kg)  04/18/23 160 lb 4 oz (72.7 kg)  12/08/22 150 lb 9.6 oz (68.3 kg)    Diabetic Foot Exam - Simple   No data filed    Lab Results  Component Value Date   WBC 3.8 (L) 12/08/2022   HGB 13.5 12/08/2022   HCT 41.1 12/08/2022   PLT 323.0 12/08/2022   GLUCOSE 86 12/08/2022   CHOL 185 12/08/2022   TRIG 49.0 12/08/2022   HDL 84.80 12/08/2022   LDLDIRECT 154.3 07/28/2010   LDLCALC 90 12/08/2022   ALT 19 12/08/2022   AST 21 12/08/2022   NA 139 12/08/2022   K 5.0 12/08/2022   CL 98 12/08/2022   CREATININE 0.89 12/08/2022   BUN 16 12/08/2022   CO2 33 (H) 12/08/2022   TSH 2.34 12/08/2022   HGBA1C 5.2 12/08/2022    Lab Results  Component Value Date   TSH 2.34 12/08/2022   Lab Results  Component Value Date   WBC 3.8 (L) 12/08/2022   HGB 13.5 12/08/2022   HCT 41.1 12/08/2022   MCV 95.7 12/08/2022   PLT 323.0 12/08/2022   Lab Results  Component Value Date   NA 139 12/08/2022   K 5.0 12/08/2022   CO2 33 (H) 12/08/2022   GLUCOSE 86 12/08/2022   BUN 16 12/08/2022   CREATININE 0.89 12/08/2022   BILITOT 0.4 12/08/2022   ALKPHOS 62 12/08/2022   AST 21 12/08/2022   ALT 19 12/08/2022   PROT 6.5 12/08/2022   ALBUMIN 4.4 12/08/2022   CALCIUM 9.9 12/08/2022   ANIONGAP 8 12/09/2020   GFR 76.06 12/08/2022   Lab Results  Component Value Date   CHOL 185 12/08/2022   Lab Results  Component Value Date   HDL 84.80 12/08/2022   Lab Results  Component Value Date   LDLCALC 90 12/08/2022   Lab Results  Component Value Date   TRIG 49.0 12/08/2022   Lab Results  Component Value Date   CHOLHDL 2 12/08/2022   Lab Results  Component Value Date   HGBA1C 5.2 12/08/2022       Assessment & Plan:   Preventative health care Assessment & Plan: Ghm utd Check labs See AVS Health Maintenance  Topic Date Due   Hepatitis B Vaccines (1 of 3 - 19+ 3-dose series) Never done   Cervical Cancer Screening (HPV/Pap Cotest)  03/03/2014   INFLUENZA VACCINE  12/14/2023   Zoster Vaccines- Shingrix  (2 of 2) 02/04/2024   MAMMOGRAM  08/16/2024   DTaP/Tdap/Td (4 - Td or Tdap) 12/07/2031   Colonoscopy  07/05/2032   COVID-19 Vaccine  Completed   Hepatitis C Screening  Completed   HPV VACCINES  Aged Out   Meningococcal B Vaccine  Aged Out   HIV Screening  Discontinued     Orders: -     CBC with Differential/Platelet -     Comprehensive metabolic panel with GFR -  Lipid panel -     TSH  Anxiety  Need for shingles vaccine -     Varicella-zoster vaccine IM  Overweight (BMI 25.0-29.9) -     Tirzepatide -Weight Management; Inject 2.5 mg into the skin once a week.  Dispense: 2 mL; Refill: 0  Anxiety state Assessment & Plan: Stable  Con't viibryd     Assessment and Plan Assessment & Plan Obesity   She is concerned about her weight, with a BMI of 27, and is interested in weight management options. She has explored the [employer] for [employer] medications. Discussed using a lower dose of medication to maintain or lose weight, as perimenopausal symptoms may be contributing to her weight concerns. The [employer] program offers a lower cost option, and some patients do not require a high dose for weight loss or maintenance. Prescribe tirzepatide  (Zepbound ) 2.5 mg once a week through the [employer].  Perimenopause   She is experiencing symptoms consistent with perimenopause, including mood swings and fatigue. Testing for menopause is not feasible due to her IUD. These symptoms may be contributing to her overall health concerns.  Anxiety and Sleep Disturbance   Reports anxiety and difficulty sleeping, often requiring Xanax . Concerned about the safety of using Tylenol  PM.  Explained that Tylenol  PM contains diphenhydramine , which may be a safer alternative for occasional use. Experiences anxiety related to her health monitoring devices, addressed by her therapist. Discussed preference for non-pharmacological interventions when possible. Consider using diphenhydramine  for sleep instead of Xanax .  Restless Leg Syndrome   Suspects she has inherited restless leg syndrome. Currently using magnesium supplements and electrolytes to manage symptoms. Discussed additional homeopathic options and the historical use of quinine for symptom relief. Tonic water containing quinine is suggested as an alternative since prescription quinine is no longer available. Consider using Hyland's homeopathic remedy for leg cramps. Continue current use of magnesium and electrolytes. Consider tonic water for quinine intake.  General Health Maintenance   She is due for a shingles vaccination and has recently had a colonoscopy. Discussed the importance of receiving the shingles vaccine and potential side effects, which range from none to flu-like symptoms. Most people experience a sore arm, slightly worse than a tetanus shot. Administer the first dose of the shingles vaccine today. Schedule the second dose of the shingles vaccine in 2-6 months.    Jaysion Ramseyer R Lowne Chase, DO

## 2023-12-10 NOTE — Assessment & Plan Note (Signed)
 Stable  Con't viibryd 

## 2023-12-11 ENCOUNTER — Encounter: Payer: Self-pay | Admitting: Family Medicine

## 2023-12-11 ENCOUNTER — Ambulatory Visit: Payer: Self-pay | Admitting: Family Medicine

## 2023-12-11 ENCOUNTER — Encounter: Payer: Self-pay | Admitting: *Deleted

## 2023-12-11 DIAGNOSIS — E162 Hypoglycemia, unspecified: Secondary | ICD-10-CM

## 2023-12-11 LAB — TSH: TSH: 1.81 u[IU]/mL (ref 0.35–5.50)

## 2023-12-12 ENCOUNTER — Other Ambulatory Visit: Payer: Self-pay

## 2023-12-12 DIAGNOSIS — E875 Hyperkalemia: Secondary | ICD-10-CM

## 2023-12-13 ENCOUNTER — Other Ambulatory Visit: Payer: Self-pay | Admitting: Family Medicine

## 2023-12-13 DIAGNOSIS — F419 Anxiety disorder, unspecified: Secondary | ICD-10-CM

## 2023-12-14 NOTE — Telephone Encounter (Signed)
 Requesting: xanax  Contract:NA UDS:NA Last Visit:12/10/23 Next Visit:n/a Last Refill:08/14/23  Please Advise

## 2023-12-17 ENCOUNTER — Other Ambulatory Visit (INDEPENDENT_AMBULATORY_CARE_PROVIDER_SITE_OTHER)

## 2023-12-17 DIAGNOSIS — E875 Hyperkalemia: Secondary | ICD-10-CM

## 2023-12-17 LAB — BASIC METABOLIC PANEL WITH GFR
BUN: 8 mg/dL (ref 6–23)
CO2: 31 meq/L (ref 19–32)
Calcium: 9.4 mg/dL (ref 8.4–10.5)
Chloride: 97 meq/L (ref 96–112)
Creatinine, Ser: 0.9 mg/dL (ref 0.40–1.20)
GFR: 74.51 mL/min (ref >60.00)
Glucose, Bld: 84 mg/dL (ref 70–99)
Potassium: 4 meq/L (ref 3.5–5.1)
Sodium: 134 meq/L — ABNORMAL LOW (ref 135–145)

## 2023-12-23 ENCOUNTER — Ambulatory Visit: Payer: Self-pay | Admitting: Family Medicine

## 2024-01-02 ENCOUNTER — Other Ambulatory Visit: Payer: Self-pay | Admitting: Family Medicine

## 2024-01-02 ENCOUNTER — Encounter: Payer: Self-pay | Admitting: Family Medicine

## 2024-01-02 DIAGNOSIS — E162 Hypoglycemia, unspecified: Secondary | ICD-10-CM

## 2024-01-02 MED ORDER — FREESTYLE LIBRE 3 PLUS SENSOR MISC: 11 refills | Status: DC

## 2024-01-26 ENCOUNTER — Other Ambulatory Visit: Payer: Self-pay | Admitting: Family Medicine

## 2024-01-26 DIAGNOSIS — K219 Gastro-esophageal reflux disease without esophagitis: Secondary | ICD-10-CM

## 2024-01-28 ENCOUNTER — Ambulatory Visit: Admitting: Skilled Nursing Facility1

## 2024-01-28 ENCOUNTER — Ambulatory Visit: Payer: Self-pay

## 2024-01-28 NOTE — Telephone Encounter (Addendum)
 RN attempted to contact patient x3, no answer. RN left vm message. RN previously scheduled appt 01/29/24,  for patient, but patient is unaware of appointment.   RN could not get in contact with patient regarding sooner appointment,01/29/24,this nurse cancelled appt.   Clinic will need to call patient in the morning and schedule appt.

## 2024-01-28 NOTE — Telephone Encounter (Signed)
 Appt scheduled 01-29-24

## 2024-01-28 NOTE — Telephone Encounter (Signed)
    Copied from CRM [id number]. Topic: Clinical - Red Word Triage >> Jan 28, 2024  4:13 PM [name] wrote: Red Word that prompted transfer to Nurse Triage: low blood sugar Reason for Disposition . [1] Blood glucose 70 mg/dL (3.9 mmol/L) or below, OR symptomatic AND [2] cause known    Nurse judgement, not currently symptomatic but blood sugar fluctuates, request seen within 24 hours.  Answer Assessment - Initial Assessment Questions Patient is not DM. S/Flowers RNY 08/2016; thinks hypoglycemia could be from  Blood sugar levels 48-255; fluctuate   Uses: Lib[employer] 3, auto blood glucose checker, started checking 3 weeks ago  1. Last low blood sugar 67 last night  2. ONSET:  When did the symptoms start?     Couple years ago, but it's slowly getting progressively worse 3. BLOOD GLUCOSE: What is your blood glucose level?      98  4. OTHER SYMPTOMS: Do you have any symptoms? (e.g., fever, frequent urination, difficulty breathing, vomiting)    no  5. FOOD: When did you last eat or drink?       Seeing nutritionist, eat high calorie, drink milk at night with medications.  6. ALONE: Are you alone right now or is someone with you?        family  Patient reports I can feel when getting low, get brain fog and start sweating. Keeps Glucose tabs, fruit snacks with her at all times.  Protocols used: Diabetes - Low Blood Sugar-A-AH

## 2024-01-28 NOTE — Telephone Encounter (Signed)
 Called [facility], spoke with Katelyn, need to schedule pt with any provider, pcp has no appt.  Scheduled 01/29/24, RN attempted call patient x3, will place in Call Back.

## 2024-01-29 ENCOUNTER — Ambulatory Visit: Admitting: Medical

## 2024-01-29 ENCOUNTER — Encounter: Payer: Self-pay | Admitting: Family Medicine

## 2024-01-29 ENCOUNTER — Ambulatory Visit: Admitting: Family Medicine

## 2024-01-29 VITALS — BP 110/76 | HR 61 | Temp 98.1°F | Resp 18 | Ht 64.0 in | Wt 161.0 lb

## 2024-01-29 DIAGNOSIS — F419 Anxiety disorder, unspecified: Secondary | ICD-10-CM | POA: Diagnosis not present

## 2024-01-29 DIAGNOSIS — E162 Hypoglycemia, unspecified: Secondary | ICD-10-CM | POA: Diagnosis not present

## 2024-01-29 MED ORDER — ALPRAZOLAM 0.5 MG PO TABS: 0.5000 mg | ORAL_TABLET | Freq: Three times a day (TID) | ORAL | 1 refills | Status: DC | PRN

## 2024-01-29 NOTE — Assessment & Plan Note (Signed)
 S/p gastric bypass

## 2024-01-29 NOTE — Assessment & Plan Note (Signed)
 Pt has psych app pending  Con't counseling Con't viibryd  Con't as needed xanax 

## 2024-01-29 NOTE — Assessment & Plan Note (Signed)
 Eat regularly every 3 hours Keep high protein snacks on you

## 2024-01-29 NOTE — Progress Notes (Signed)
 Subjective:    Patient ID: [name], female    DOB: 04/05/1976, 48 y.o.   MRN: [id number]  Chief Complaint  Patient presents with   Weight Check    HPI Patient is in today for weight check and hypoglycemia.  Discussed the use of AI scribe software for clinical note transcription with the patient, who gave verbal consent to proceed.  History of Present Illness [name] is a 48 year old female with anxiety and hypoglycemia who presents with worsening blood sugar fluctuations and anxiety.  She experiences significant fluctuations in her blood sugar levels, with lows reaching 48 mg/dL. She uses a glucose monitor to track these fluctuations and experiences symptoms such as sweating, feeling 'yucky,' and brain fog, particularly in the afternoons. These symptoms have progressively worsened over the past few years, impacting her work performance, especially during busy periods. Despite following a dietary regimen of smaller, more frequent meals, including a protein shake and banana for breakfast, snacks like dried edamame and string cheese, and a protein bar or yogurt in the afternoon, she experiences unexpected drops in blood sugar, sometimes after consuming certain foods like a buffalo chicken sandwich or candy corn.  She has a history of anxiety, which she describes as severe, contributing to negative self-talk and panic attacks. She is discussing her anxiety with a therapist and is on a waiting list to see a psychiatrist. She takes Viibryd  at the maximum dose for anxiety and occasionally uses Xanax  during severe anxiety episodes, though she is cautious about its use due to concerns about addiction. Stress and anxiety exacerbate her blood sugar fluctuations.  She has a history of gastric bypass surgery, which she believes may contribute to her hypoglycemia. Her husband is diabetic, and she keeps glucose tabs and snacks on hand to manage sudden drops in blood sugar.  During episodes  of low blood sugar, she experiences tunnel vision and ringing in her ears.   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   Procedure: LAPAROSCOPIC ROUX-EN-Y GASTRIC BYPASS WITH UPPER ENDOSCOPY;  Surgeon: Camellia Blush, MD;  Location: [facility];  Service: General;  Laterality: N/A;    Family History  Problem Relation Age of Onset   Breast cancer Mother    Hypertension Mother    Skin cancer Mother    Colon polyps Father    Hypertension Father    Skin cancer Father    Diabetes Maternal Grandmother    Colon cancer Maternal Grandfather    Pancreatic cancer Maternal Grandfather    Arthritis Paternal Grandmother    BRCA 1/2 Paternal Grandmother    BRCA 1/2 Cousin    Esophageal cancer Neg Hx    Rectal cancer Neg Hx    Stomach cancer Neg Hx     Social History   Socioeconomic History   Marital status: Married    Spouse name: Not on file   Number of children: 2   Years of education: Not on file   Highest education level: Bachelor's degree (e.g., BA, AB, BS)  Occupational History   Occupation: Museum/gallery curator: [employer]  Tobacco Use   Smoking status: Never   Smokeless  tobacco: Never  Vaping Use   Vaping status: Never Used  Substance and Sexual Activity   Alcohol use: Yes    Comment: rare   Drug use: No   Sexual activity: Yes  Other Topics Concern   Not on file  Social History Narrative   Exercising 4-5 days a week    Social Drivers of Health   Financial Resource Strain: Low Risk  (12/09/2023)   Overall Financial Resource Strain (CARDIA)    Difficulty of Paying Living Expenses: Not very hard  Food Insecurity: No Food Insecurity (12/09/2023)   Hunger Vital Sign    Worried About Running Out of Food in  the Last Year: Never true    Ran Out of Food in the Last Year: Never true  Transportation Needs: No Transportation Needs (12/09/2023)   PRAPARE - Administrator, Civil Service (Medical): No    Lack of Transportation (Non-Medical): No  Physical Activity: Sufficiently Active (12/09/2023)   Exercise Vital Sign    Days of Exercise per Week: 4 days    Minutes of Exercise per Session: 40 min  Stress: Stress Concern Present (12/09/2023)   [name]    Feeling of Stress: Rather much  Social Connections: Moderately Isolated (12/09/2023)   Social Connection and Isolation Panel    Frequency of Communication with Friends and Family: More than three times a week    Frequency of Social Gatherings with Friends and Family: Twice a week    Attends Religious Services: Never    Database administrator or Organizations: No    Attends Engineer, structural: Not on file    Marital Status: Married  Intimate Partner Violence: Unknown (08/16/2021)   Received from [facility]   HITS    Physically Hurt: Not on file    Insult or Talk Down To: Not on file    Threaten Physical Harm: Not on file    Scream or Curse: Not on file    Outpatient Medications Prior to Visit  Medication Sig Dispense Refill   Continuous Glucose Sensor (FREESTYLE LIBRE 3 PLUS SENSOR) MISC Change sensor every 15 days. 1 each 11   fluticasone  (FLONASE ) 50 MCG/ACT nasal spray Place 2 sprays into both nostrils at bedtime. 48 g 0   levonorgestrel (MIRENA) 20 MCG/24HR IUD 1 each by Intrauterine route once.     NONFORMULARY OR COMPOUNDED ITEM Bariatric multivitamin 1 po qd 90 each 3   omeprazole  (PRILOSEC) 40 MG capsule Take 1 capsule (40 mg total) by mouth daily. 90 capsule 0   Probiotic Product (PROBIOTIC PO) Take by mouth.     tretinoin (RETIN-A) 0.05 % cream APPLY TO FACE ONCE DAILY  AT BEDTIME  3   Vilazodone  HCl (VIIBRYD ) 40 MG TABS Take 1 tablet (40 mg  total) by mouth daily. 90 tablet 0   ALPRAZolam  (XANAX ) 0.5 MG tablet TAKE 1 TABLET BY MOUTH TWICE A DAY AS NEEDED FOR ANXIETY 60 tablet 0   tirzepatide  (ZEPBOUND ) 2.5 MG/0.5ML injection vial Inject 2.5 mg into the skin once a week. (Patient not taking: Reported on 01/29/2024) 2 mL 0   No facility-administered medications prior to visit.    Allergies  Allergen Reactions   Amoxicillin Hives    Has patient had a PCN reaction causing immediate rash, facial/tongue/throat swelling, SOB or lightheadedness with hypotension:No Has patient had a PCN reaction causing severe rash involving mucus membranes or skin necrosis:unsure--doesn't think so Has patient had a PCN  reaction that required hospitalization:No Has patient had a PCN reaction occurring within the last 10 years:No If all of the above answers are NO, then may proceed with Cephalosporin use.    Labetalol Hcl Other (See Comments)    Asthma   Sulfonamide Derivatives Hives    Kidney Disease    Review of Systems  Constitutional:  Negative for fever and malaise/fatigue.  HENT:  Negative for congestion.   Eyes:  Negative for blurred vision.  Respiratory:  Negative for shortness of breath.   Cardiovascular:  Negative for chest pain, palpitations and leg swelling.  Gastrointestinal:  Negative for abdominal pain, blood in stool and nausea.  Genitourinary:  Negative for dysuria and frequency.  Musculoskeletal:  Negative for falls.  Skin:  Negative for rash.  Neurological:  Negative for dizziness, loss of consciousness and headaches.  Endo/Heme/Allergies:  Negative for environmental allergies.  Psychiatric/Behavioral:  Negative for depression. The patient is not nervous/anxious.        Objective:    Physical Exam  BP 110/76 (BP Location: Right Arm, Patient Position: Sitting, Cuff Size: Normal)   Pulse 61   Temp 98.1 F (36.7 C) (Oral)   Resp 18   Ht 5' 4 (1.626 m)   Wt 161 lb (73 kg)   SpO2 95%   BMI 27.64 kg/m  Wt Readings  from Last 3 Encounters:  01/29/24 161 lb (73 kg)  12/10/23 160 lb (72.6 kg)  04/18/23 160 lb 4 oz (72.7 kg)    Diabetic Foot Exam - Simple   No data filed    Lab Results  Component Value Date   WBC 3.6 (L) 12/10/2023   HGB 13.0 12/10/2023   HCT 37.8 12/10/2023   PLT 300.0 12/10/2023   GLUCOSE 84 12/17/2023   CHOL 184 12/10/2023   TRIG 56.0 12/10/2023   HDL 81.60 12/10/2023   LDLDIRECT 154.3 07/28/2010   LDLCALC 92 12/10/2023   ALT 16 12/10/2023   AST 20 12/10/2023   NA 134 (L) 12/17/2023   K 4.0 12/17/2023   CL 97 12/17/2023   CREATININE 0.90 12/17/2023   BUN 8 12/17/2023   CO2 31 12/17/2023   TSH 1.81 12/10/2023   HGBA1C 5.2 12/08/2022    Lab Results  Component Value Date   TSH 1.81 12/10/2023   Lab Results  Component Value Date   WBC 3.6 (L) 12/10/2023   HGB 13.0 12/10/2023   HCT 37.8 12/10/2023   MCV 93.4 12/10/2023   PLT 300.0 12/10/2023   Lab Results  Component Value Date   NA 134 (L) 12/17/2023   K 4.0 12/17/2023   CO2 31 12/17/2023   GLUCOSE 84 12/17/2023   BUN 8 12/17/2023   CREATININE 0.90 12/17/2023   BILITOT 0.5 12/10/2023   ALKPHOS 60 12/10/2023   AST 20 12/10/2023   ALT 16 12/10/2023   PROT 6.2 12/10/2023   ALBUMIN 4.2 12/10/2023   CALCIUM 9.4 12/17/2023   ANIONGAP 8 12/09/2020   GFR 74.51 12/17/2023   Lab Results  Component Value Date   CHOL 184 12/10/2023   Lab Results  Component Value Date   HDL 81.60 12/10/2023   Lab Results  Component Value Date   LDLCALC 92 12/10/2023   Lab Results  Component Value Date   TRIG 56.0 12/10/2023   Lab Results  Component Value Date   CHOLHDL 2 12/10/2023   Lab Results  Component Value Date   HGBA1C 5.2 12/08/2022       Assessment & Plan:  Hypoglycemia Assessment &  Plan: Eat regularly every 3 hours Keep high protein snacks on you    Anxiety Assessment & Plan: Pt has psych app pending  Con't counseling Con't viibryd  Con't as needed xanax    Orders: -     ALPRAZolam ;  Take 1 tablet (0.5 mg total) by mouth 3 (three) times daily as needed for anxiety.  Dispense: 90 tablet; Refill: 1  Morbid obesity [facility]) Assessment & Plan: S/p gastric bypass      Sabrina JONELLE Antonio Cyndee, DO

## 2024-01-29 NOTE — Patient Instructions (Signed)
 Hypoglycemia Hypoglycemia is when the amount of sugar, or glucose, in your blood is too low. Low blood sugar can happen if you have diabetes or if you don't have diabetes. It may be an emergency. What are the causes? Low blood sugar happens most often in people who have diabetes. It may be caused by: Diabetes medicine. Not eating enough, or not eating often enough. Being more active than normal. If you don't have diabetes, you may still get low blood sugar if: There's a tumor in your pancreas. A tumor is a growth of cells that isn't normal. You don't eat enough, or you fast. Fasting is when you don't eat for long periods at a time. You have a bad infection or illness. You have problems after weight loss surgery. You have kidney or liver problems. You take certain medicines. What increases the risk? You're more likely to have low blood sugar if: You have diabetes and take medicine for it. You drink a lot of alcohol . You get sick. What are the signs or symptoms? Mild Hunger or feeling like you may vomit. Sweating and feeling cold to the touch. Feeling dizzy or light-headed. Being sleepy or having trouble sleeping. A headache. Blurry vision. Mood changes. These include feeling worried, nervous, or easily annoyed. Moderate Feeling confused. Changes in the way you act. Weakness. An uneven heartbeat. Very bad Having very low blood sugar is an emergency. It can cause: Fainting. Seizures. A coma. Death. How is this diagnosed?  Low blood sugar can be found with a blood test. This test tells you how much sugar is in your blood. It's done while you're having symptoms. Your health care provider may also do an exam and look at your medical history. How is this treated? Treating low blood sugar If you have low blood sugar, eat or drink something with sugar in it right away. The food or drink should have 15 grams of a fast-acting carbohydrate (carb). Options include: 4 oz (120 mL) of  fruit juice. 4 oz (120 mL) of soda (not diet soda). A few pieces of hard candy. Check food labels to see how many pieces to eat. 1 Tbsp (15 mL) of sugar or honey. 4 glucose tablets. 1 tube of glucose gel. Treating low blood sugar if you have diabetes Talk with your provider about how much carb you should take. If you're alert and can swallow safely, you may follow the 15:15 rule: Take 15 grams of a fast-acting carb. Check your blood sugar 15 minutes after you take the carb. If your blood sugar is still at or below 70 mg/dL (3.9 mmol/L), take 15 grams of a carb again. If your blood sugar doesn't go above 70 mg/dL (3.9 mmol/L) after 3 tries, get help right away. After your blood sugar goes back to normal, eat a meal or a snack within 1 hour. Always keep 15 grams of a fast-acting carb with you. This could be: 4 glucose tablets. A few pieces of hard candy. 1 Tbsp (15 mL) of honey or sugar. 1 tube of glucose gel. Treating very low blood sugar If your blood sugar is less than 54 mg/dL (3 mmol/L), it's an emergency. Get help right away. If you can't eat or drink, you will need to be given glucagon. A family member or friend should learn how to check your blood sugar and give you glucagon. Ask your provider if you should keep a glucagon kit at home. You may also need to be treated in a hospital. Follow  these instructions at home: If you have diabetes: Always keep a fast-acting carb (15 grams) with you. Follow your diabetes care plan. Make sure you: Know the symptoms of low blood sugar. Check your blood sugar as often as told. Always check it before and after you exercise. Always check your blood sugar before you drive. Take your medicines as told. Eat on time. Do not skip meals. Share your diabetes care plan with: Your work or school. The people you live with. Wear an alert bracelet or carry a card that says you have diabetes. General instructions If you drink alcohol : Limit how much you  have to: 0-1 drink a day if you're female. 0-2 drinks a day if you're female. Know how much alcohol  is in your drink. In the [place] one drink is one 12 oz bottle of beer (355 mL), one 5 oz glass of wine (148 mL), or one 1 oz glass of hard liquor (44 mL). Be sure to eat food when you drink alcohol . Be sure to check your blood sugar after you drink. Alcohol  may lead to low blood sugar later. Where to find more information American Diabetes Association (ADA): diabetes.org Contact a health care provider if: You have low blood sugar often. You have diabetes and are having trouble keeping your blood sugar in the right range. Get help right away if: You can't get your blood sugar above 70 mg/dL (3.9 mmol/L) after 3 tries. Your blood sugar is below 54 mg/dL (3 mmol/L). You have a seizure. You faint. These symptoms may be an emergency. Call 911 right away. Do not wait to see if the symptoms will go away. Do not drive yourself to the hospital. This information is not intended to replace advice given to you by your health care provider. Make sure you discuss any questions you have with your health care provider. Document Revised: 02/01/2023 Document Reviewed: 07/19/2022 Elsevier Patient Education  2024 [employer].

## 2024-01-29 NOTE — Telephone Encounter (Signed)
Pt has appt today w/ PCP.  °

## 2024-02-05 ENCOUNTER — Ambulatory Visit: Admitting: Family Medicine

## 2024-02-11 NOTE — Progress Notes (Unsigned)
 [name] D.CLEMENTEEN [name] [address] [place] [address] Phone: [phone]   Assessment and Plan:     1. Greater trochanteric pain syndrome of left lower extremity (Primary) -Chronic with exacerbation, initial visit - Recurrent left greater trochanteric bursitis, likely flared due to physical activity running and occasional side sleeping - Patient has had significant relief with greater trochanteric CSI in the past typically receiving 6 to 12 months relief.  Elected for repeat greater trochanteric CSI today.  Tolerated well per note below - Continue HEP targeting gluteal musculature - Do not recommend NSAIDs with past medical history of gastric bypass  Procedure: Greater trochanteric bursal injection Side: Left  Risks explained and consent was given verbally. The site was cleaned with alcohol prep. A steroid injection was performed with patient in the lateral side-lying position at area of maximum tenderness over greater trochanter using 2mL of 1% lidocaine  without epinephrine and 1mL of kenalog  40mg /ml. This was well tolerated.  Needle was removed, hemostasis achieved, and post injection instructions were explained.  Pt was advised to call or return to clinic if these symptoms worsen or fail to improve as anticipated.    2. Chronic left shoulder pain 3. Rotator cuff strain, left, initial encounter -Chronic with exacerbation, initial visit - Most consistent with rotator cuff strain, primarily supraspinatus from physical activity and sleeping position - Patient elected for subacromial CSI.  Tolerated well per note below - Start HEP for rotator cuff - Do not recommend NSAIDs with past medical history of gastric bypass  Procedure: Subacromial Injection Side: Left  Risks explained and consent was given verbally. The site was cleaned with alcohol prep. A steroid injection was performed from posterior approach using 2mL of 1% lidocaine  without  epinephrine and 1mL of kenalog  40mg /ml. This was well tolerated.  Needle was removed, hemostasis achieved, and post injection instructions were explained.   Pt was advised to call or return to clinic if these symptoms worsen or fail to improve as anticipated.    15 additional minutes spent for educating Therapeutic Home Exercise Program.  This included exercises focusing on stretching, strengthening, with focus on eccentric aspects.   Long term goals include an improvement in range of motion, strength, endurance as well as avoiding reinjury. Patient's frequency would include in 1-2 times a day, 3-5 times a week for a duration of 6-12 weeks. Proper technique shown and discussed handout in great detail with ATC.  All questions were discussed and answered.    Pertinent previous records reviewed include none   Follow Up: 3 weeks for reevaluation.  If no improvement or worsening of symptoms, could consider physical therapy versus prednisone  course   Subjective:   I, Abigail Marsiglia, am serving as a Neurosurgeon for Doctor Morene Mace  Chief Complaint: left hip pain   HPI:   02/12/2024 Patient is a 48 year old female with left hip pain. Patient states intermittent pain for years. Was seen by schmidts. Hx of CSI and would like to discuss. She has been in PT. She is an avid runner. She is physically active. L shoulder pain for 4-5 month. No radiating hip pain. No numbness or tingling. Was told she had a bone spur.    Relevant Historical Information: History of gastric bypass  Additional pertinent review of systems negative.   Current Outpatient Medications:    ALPRAZolam  (XANAX ) 0.5 MG tablet, Take 1 tablet (0.5 mg total) by mouth 3 (three) times daily as needed for anxiety., Disp:  90 tablet, Rfl: 1   Continuous Glucose Sensor (FREESTYLE LIBRE 3 PLUS SENSOR) MISC, Change sensor every 15 days., Disp: 1 each, Rfl: 11   fluticasone  (FLONASE ) 50 MCG/ACT nasal spray, Place 2 sprays into both nostrils at  bedtime., Disp: 48 g, Rfl: 0   levonorgestrel (MIRENA) 20 MCG/24HR IUD, 1 each by Intrauterine route once., Disp: , Rfl:    NONFORMULARY OR COMPOUNDED ITEM, Bariatric multivitamin 1 po qd, Disp: 90 each, Rfl: 3   omeprazole  (PRILOSEC) 40 MG capsule, Take 1 capsule (40 mg total) by mouth daily., Disp: 90 capsule, Rfl: 0   Probiotic Product (PROBIOTIC PO), Take by mouth., Disp: , Rfl:    tretinoin (RETIN-A) 0.05 % cream, APPLY TO FACE ONCE DAILY  AT BEDTIME, Disp: , Rfl: 3   Vilazodone  HCl (VIIBRYD ) 40 MG TABS, Take 1 tablet (40 mg total) by mouth daily., Disp: 90 tablet, Rfl: 0   Objective:     Vitals:   02/12/24 1055  BP: 120/80  Weight: 165 lb (74.8 kg)  Height: 5' 4 (1.626 m)      Body mass index is 28.32 kg/m.    Physical Exam:    Gen: Appears well, nad, nontoxic and pleasant Neuro:sensation intact, strength is 5/5 with df/pf/inv/ev, muscle tone wnl Skin: no suspicious lesion or defmority Psych: A&O, appropriate mood and affect  Left shoulder:  No deformity, swelling or muscle wasting No scapular winging FF 180, abd 180 with painful arc, int 0, ext 90 NTTP over the [place], clavicle, ac, coracoid, biceps groove, humerus, deltoid, trapezius, cervical spine Positive empty can, Hawkins Neg neer,  obriens, crossarm, subscap liftoff, speeds Neg ant drawer, sulcus sign, apprehension Negative Spurling's test bilat FROM of neck   Left Hip: No deformity, swelling or wasting ROM Flexion 90, ext 30, IR 45, ER 45 TTP greater trochanter NTTP over the hip flexors, greater trochanter, gluteal musculature, si joint, lumbar spine Negative log roll with FROM Negative FABER Negative FADIR  Gait normal   Electronically signed by:  Odis Mace D.CLEMENTEEN [name] 11:30 AM 02/12/24

## 2024-02-12 ENCOUNTER — Ambulatory Visit: Admitting: Sports Medicine

## 2024-02-12 VITALS — BP 120/80 | Ht 64.0 in | Wt 165.0 lb

## 2024-02-12 DIAGNOSIS — G8929 Other chronic pain: Secondary | ICD-10-CM | POA: Diagnosis not present

## 2024-02-12 DIAGNOSIS — M25552 Pain in left hip: Secondary | ICD-10-CM

## 2024-02-12 DIAGNOSIS — M25512 Pain in left shoulder: Secondary | ICD-10-CM | POA: Diagnosis not present

## 2024-02-12 DIAGNOSIS — S46012A Strain of muscle(s) and tendon(s) of the rotator cuff of left shoulder, initial encounter: Secondary | ICD-10-CM | POA: Diagnosis not present

## 2024-02-12 NOTE — Patient Instructions (Signed)
Shoulder HEP  3 week follow up

## 2024-02-18 ENCOUNTER — Other Ambulatory Visit: Payer: Self-pay | Admitting: Family Medicine

## 2024-02-18 ENCOUNTER — Encounter: Payer: Self-pay | Admitting: Family Medicine

## 2024-02-18 DIAGNOSIS — K649 Unspecified hemorrhoids: Secondary | ICD-10-CM

## 2024-02-18 MED ORDER — HYDROCORT-PRAMOXINE (PERIANAL) 1-1 % EX FOAM
1.0000 | Freq: Two times a day (BID) | CUTANEOUS | 3 refills | Status: DC
Start: 2024-02-18 — End: 2024-03-04

## 2024-02-20 ENCOUNTER — Ambulatory Visit

## 2024-02-25 ENCOUNTER — Other Ambulatory Visit: Payer: Self-pay | Admitting: Family Medicine

## 2024-02-25 MED ORDER — HYDROCORTISONE 2.5 % EX CREA: TOPICAL_CREAM | Freq: Two times a day (BID) | CUTANEOUS | 0 refills | Status: DC

## 2024-02-26 ENCOUNTER — Ambulatory Visit (INDEPENDENT_AMBULATORY_CARE_PROVIDER_SITE_OTHER): Admitting: *Deleted

## 2024-02-26 DIAGNOSIS — Z23 Encounter for immunization: Secondary | ICD-10-CM

## 2024-02-26 NOTE — Progress Notes (Signed)
 Pt in for 2nd Shingrix . Given LD without complications.

## 2024-03-03 ENCOUNTER — Other Ambulatory Visit: Payer: Self-pay | Admitting: Family Medicine

## 2024-03-03 DIAGNOSIS — F419 Anxiety disorder, unspecified: Secondary | ICD-10-CM

## 2024-03-04 ENCOUNTER — Ambulatory Visit: Admitting: Sports Medicine

## 2024-03-04 ENCOUNTER — Telehealth: Admitting: Physician Assistant

## 2024-03-04 DIAGNOSIS — B9689 Other specified bacterial agents as the cause of diseases classified elsewhere: Secondary | ICD-10-CM | POA: Diagnosis not present

## 2024-03-04 DIAGNOSIS — J019 Acute sinusitis, unspecified: Secondary | ICD-10-CM

## 2024-03-04 MED ORDER — DOXYCYCLINE HYCLATE 100 MG PO TABS: 100.0000 mg | ORAL_TABLET | Freq: Two times a day (BID) | ORAL | 0 refills | Status: DC

## 2024-03-04 NOTE — Patient Instructions (Signed)
 [name], thank you for joining Sabrina Velma Lunger, PA-C for today's virtual visit.  While this provider is not your primary care provider (PCP), if your PCP is located in our provider database this encounter information will be shared with them immediately following your visit.   A [name] MyChart account gives you access to today's visit and all your visits, tests, and labs performed at [facility]  click here if you don't have a [facility] MyChart account or go to [email or web address]  Consent: (Patient) [name] provided verbal consent for this virtual visit at the beginning of the encounter.  Current Medications:  Current Outpatient Medications:    ALPRAZolam  (XANAX ) 0.5 MG tablet, Take 1 tablet (0.5 mg total) by mouth 3 (three) times daily as needed for anxiety., Disp: 90 tablet, Rfl: 1   Continuous Glucose Sensor (FREESTYLE LIBRE 3 PLUS SENSOR) MISC, Change sensor every 15 days., Disp: 1 each, Rfl: 11   fluticasone  (FLONASE ) 50 MCG/ACT nasal spray, Place 2 sprays into both nostrils at bedtime., Disp: 48 g, Rfl: 0   hydrocortisone  2.5 % cream, Apply topically 2 (two) times daily., Disp: 30 g, Rfl: 0   hydrocortisone -pramoxine (PROCTOFOAM-HC) rectal foam, Place 1 applicator rectally 2 (two) times daily., Disp: 10 g, Rfl: 3   levonorgestrel (MIRENA) 20 MCG/24HR IUD, 1 each by Intrauterine route once., Disp: , Rfl:    NONFORMULARY OR COMPOUNDED ITEM, Bariatric multivitamin 1 po qd, Disp: 90 each, Rfl: 3   omeprazole  (PRILOSEC) 40 MG capsule, Take 1 capsule (40 mg total) by mouth daily., Disp: 90 capsule, Rfl: 0   Probiotic Product (PROBIOTIC PO), Take by mouth., Disp: , Rfl:    tretinoin (RETIN-A) 0.05 % cream, APPLY TO FACE ONCE DAILY  AT BEDTIME, Disp: , Rfl: 3   Vilazodone  HCl (VIIBRYD ) 40 MG TABS, Take 1 tablet (40 mg total) by mouth daily., Disp: 90 tablet, Rfl: 0   Medications ordered in this encounter:  No orders of the defined types were  placed in this encounter.    *If you need refills on other medications prior to your next appointment, please contact your pharmacy*  Follow-Up: Call back or seek an in-person evaluation if the symptoms worsen or if the condition fails to improve as anticipated.  [facility] [phone]  Other Instructions Please take antibiotic as directed.  Increase fluid intake.  Use Saline nasal spray.  Take a daily multivitamin. Restart Flonase  nasal spray.  Place a humidifier in the bedroom.  Please call or return clinic if symptoms are not improving.  Sinusitis Sinusitis is redness, soreness, and swelling (inflammation) of the paranasal sinuses. Paranasal sinuses are air pockets within the bones of your face (beneath the eyes, the middle of the forehead, or above the eyes). In healthy paranasal sinuses, mucus is able to drain out, and air is able to circulate through them by way of your nose. However, when your paranasal sinuses are inflamed, mucus and air can become trapped. This can allow bacteria and other germs to grow and cause infection. Sinusitis can develop quickly and last only a short time (acute) or continue over a long period (chronic). Sinusitis that lasts for more than 12 weeks is considered chronic.  CAUSES  Causes of sinusitis include: Allergies. Structural abnormalities, such as displacement of the cartilage that separates your nostrils (deviated septum), which can decrease the air flow through your nose and sinuses and affect sinus drainage. Functional abnormalities, such as when the small hairs (cilia) that  line your sinuses and help remove mucus do not work properly or are not present. SYMPTOMS  Symptoms of acute and chronic sinusitis are the same. The primary symptoms are pain and pressure around the affected sinuses. Other symptoms include: Upper toothache. Earache. Headache. Bad breath. Decreased sense of smell and taste. A cough, which worsens when you are  lying flat. Fatigue. Fever. Thick drainage from your nose, which often is green and may contain pus (purulent). Swelling and warmth over the affected sinuses. DIAGNOSIS  Your caregiver will perform a physical exam. During the exam, your caregiver may: Look in your nose for signs of abnormal growths in your nostrils (nasal polyps). Tap over the affected sinus to check for signs of infection. View the inside of your sinuses (endoscopy) with a special imaging device with a light attached (endoscope), which is inserted into your sinuses. If your caregiver suspects that you have chronic sinusitis, one or more of the following tests may be recommended: Allergy tests. Nasal culture A sample of mucus is taken from your nose and sent to a lab and screened for bacteria. Nasal cytology A sample of mucus is taken from your nose and examined by your caregiver to determine if your sinusitis is related to an allergy. TREATMENT  Most cases of acute sinusitis are related to a viral infection and will resolve on their own within 10 days. Sometimes medicines are prescribed to help relieve symptoms (pain medicine, decongestants, nasal steroid sprays, or saline sprays).  However, for sinusitis related to a bacterial infection, your caregiver will prescribe antibiotic medicines. These are medicines that will help kill the bacteria causing the infection.  Rarely, sinusitis is caused by a fungal infection. In theses cases, your caregiver will prescribe antifungal medicine. For some cases of chronic sinusitis, surgery is needed. Generally, these are cases in which sinusitis recurs more than 3 times per year, despite other treatments. HOME CARE INSTRUCTIONS  Drink plenty of water. Water helps thin the mucus so your sinuses can drain more easily. Use a humidifier. Inhale steam 3 to 4 times a day (for example, sit in the bathroom with the shower running). Apply a warm, moist washcloth to your face 3 to 4 times a day, or  as directed by your caregiver. Use saline nasal sprays to help moisten and clean your sinuses. Take over-the-counter or prescription medicines for pain, discomfort, or fever only as directed by your caregiver. SEEK IMMEDIATE MEDICAL CARE IF: You have increasing pain or severe headaches. You have nausea, vomiting, or drowsiness. You have swelling around your face. You have vision problems. You have a stiff neck. You have difficulty breathing. MAKE SURE YOU:  Understand these instructions. Will watch your condition. Will get help right away if you are not doing well or get worse. Document Released: 05/01/2005 Document Revised: 07/24/2011 Document Reviewed: 05/16/2011 [facility] Patient Information 2014 [place], [place].    If you have been instructed to have an in-person evaluation today at a local Urgent Care facility, please use the link below. It will take you to a list of all of our available [facility], including address, phone number and hours of operation. Please do not delay care.   [facility]  If you or a family member do not have a primary care provider, use the link below to schedule a visit and establish care. When you choose a [facility] primary care physician or advanced practice provider, you gain a long-term partner in health. Find a Primary Care Provider  Learn more about [facility]'s in-office and virtual care options: [place] - Get Care Now

## 2024-03-04 NOTE — Progress Notes (Signed)
 Virtual Visit Consent   [name], you are scheduled for a virtual visit with a [facility] provider today. Just as with appointments in the office, your consent must be obtained to participate. Your consent will be active for this visit and any virtual visit you may have with one of our providers in the next 365 days. If you have a MyChart account, a copy of this consent can be sent to you electronically.  As this is a virtual visit, video technology does not allow for your provider to perform a traditional examination. This may limit your provider's ability to fully assess your condition. If your provider identifies any concerns that need to be evaluated in person or the need to arrange testing (such as labs, EKG, etc.), we will make arrangements to do so. Although advances in technology are sophisticated, we cannot ensure that it will always work on either your end or our end. If the connection with a video visit is poor, the visit may have to be switched to a telephone visit. With either a video or telephone visit, we are not always able to ensure that we have a secure connection.  By engaging in this virtual visit, you consent to the provision of healthcare and authorize for your insurance to be billed (if applicable) for the services provided during this visit. Depending on your insurance coverage, you may receive a charge related to this service.  I need to obtain your verbal consent now. Are you willing to proceed with your visit today? [name] has provided verbal consent on 03/04/2024 for a virtual visit (video or telephone). [name], [place]  Date: 03/04/2024 4:01 PM   Virtual Visit via Video Note   I, [name], connected with  [name]  ([id number], [birth year]) on 03/04/24 at  4:00 PM EDT by a video-enabled telemedicine application and verified that I am speaking with the correct person using two identifiers.  Location: Patient: Virtual Visit  Location Patient: Home Provider: Virtual Visit Location Provider: Home Office   I discussed the limitations of evaluation and management by telemedicine and the availability of in person appointments. The patient expressed understanding and agreed to proceed.    History of Present Illness: [name] is a 48 y.o. who identifies as a female who was assigned female at birth, and is being seen today for possible sinusitis. Endorses symptoms starting 8+ days ago, initially with fatigue and sinus headache. Had gotten her shingles shot a few days prior so thought was related. However since then began developing nasal congestion, sore throat, sinus pressure. Now with substantial nasal discharge and facial/maxillary sinus pain. Some cough, mild but present. Has not taken anything OTC so far due to concern for medication interactions.   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   Greater trochanteric pain syndrome of left lower extremity 03/27/2022   Nausea 02/24/2022   Other fatigue 12/02/2020   Lumbar radiculopathy 01/05/2020   Piriformis syndrome of right side 12/11/2019   Greater trochanteric pain syndrome of right lower extremity 11/07/2019   Right foot pain 07/25/2019   Muscle spasm of shoulder region 04/22/2019   Abdominal pain 10/08/2018   Left leg pain 11/05/2017   Pain of right thumb 11/05/2017   Preventative health care 11/05/2017   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   Lumbar disc disease 08/28/2016   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/28/2016   Referred otalgia of left ear 07/12/2015   Temporomandibular joint (TMJ) pain 07/12/2015   Acute maxillary sinusitis 06/09/2015   Acute left otitis media 06/09/2015   Knee pain, left 03/22/2015   Conjunctivitis 03/27/2014   Ear pain 03/27/2014   Anxiety 06/02/2013   Vitamin B-complex deficiency 10/16/2008   Depression, major, single episode,  moderate (HCC) 09/04/2006   Disorder of function of stomach 09/04/2006   COLPOSCOPY, HX OF 09/04/2006   Gastroesophageal reflux disease 09/04/2006   Depression 09/04/2006    Allergies:  Allergies  Allergen Reactions   Amoxicillin Hives    Has patient had a PCN reaction causing immediate rash, facial/tongue/throat swelling, SOB or lightheadedness with hypotension:No Has patient had a PCN reaction causing severe rash involving mucus membranes or skin necrosis:unsure--doesn't think so Has patient had a PCN reaction that required hospitalization:No Has patient had a PCN reaction occurring within the last 10 years:No If all of the above answers are NO, then may proceed with Cephalosporin use.    Labetalol Hcl Other (See Comments)    Asthma   Sulfonamide Derivatives Hives    Kidney Disease   Medications:  Current Outpatient Medications:    doxycycline  (VIBRA -TABS) 100 MG tablet, Take 1 tablet (100 mg total) by mouth 2 (two) times daily., Disp: 14 tablet, Rfl: 0   ALPRAZolam  (XANAX ) 0.5 MG tablet, Take 1 tablet (0.5 mg total) by mouth 3 (three) times daily as needed for anxiety., Disp: 90 tablet, Rfl: 1   Continuous Glucose Sensor (FREESTYLE LIBRE 3 PLUS SENSOR) MISC, Change sensor every 15 days., Disp: 1 each, Rfl: 11   fluticasone  (FLONASE ) 50 MCG/ACT nasal spray, Place 2 sprays into both nostrils at bedtime., Disp: 48 g, Rfl: 0   hydrocortisone  2.5 % cream, Apply topically 2 (two) times daily., Disp: 30 g, Rfl: 0   levonorgestrel (MIRENA) 20 MCG/24HR IUD, 1 each by Intrauterine route once., Disp: , Rfl:    NONFORMULARY OR COMPOUNDED ITEM, Bariatric multivitamin 1 po qd, Disp: 90 each, Rfl: 3   omeprazole  (PRILOSEC) 40 MG capsule, Take 1 capsule (40 mg total) by mouth daily., Disp: 90 capsule, Rfl: 0   Probiotic Product (PROBIOTIC PO), Take by mouth., Disp: , Rfl:    tretinoin (RETIN-A) 0.05 % cream, APPLY TO FACE ONCE DAILY  AT BEDTIME, Disp: , Rfl: 3   Vilazodone  HCl (VIIBRYD ) 40 MG  TABS, Take 1 tablet (40 mg total) by mouth daily., Disp: 90 tablet, Rfl: 0  Observations/Objective: Patient is well-developed, well-nourished in no acute distress.  Resting comfortably at home.  Head is normocephalic, atraumatic.  No labored breathing. Speech is clear and coherent with logical content.  Patient is alert and oriented at baseline.   Assessment and Plan: 1. Acute bacterial sinusitis (Primary) - doxycycline  (VIBRA -TABS) 100 MG tablet; Take 1 tablet (100 mg total) by mouth 2 (two) times daily.  Dispense: 14 tablet; Refill: 0  Rx Doxycycline .  Increase fluids.  Rest.  Saline nasal spray.  Probiotic.  Mucinex  as directed.  Humidifier in bedroom. Restart Flonase  nasal spray.  Call or return to clinic if symptoms are not improving.   Follow Up Instructions: I discussed the assessment and treatment plan with the patient. The patient was provided an opportunity to ask questions and all were answered. The patient agreed with the plan and demonstrated an understanding of the instructions.  A copy of instructions were sent to the patient via MyChart unless otherwise noted below.   The patient was advised to call  back or seek an in-person evaluation if the symptoms worsen or if the condition fails to improve as anticipated.    Sabrina Velma Lunger, PA-C

## 2024-03-05 ENCOUNTER — Ambulatory Visit: Admitting: Medical

## 2024-03-18 ENCOUNTER — Encounter: Payer: Self-pay | Admitting: Family Medicine

## 2024-03-31 ENCOUNTER — Other Ambulatory Visit: Payer: Self-pay | Admitting: Family Medicine

## 2024-03-31 DIAGNOSIS — F419 Anxiety disorder, unspecified: Secondary | ICD-10-CM

## 2024-04-01 ENCOUNTER — Encounter (HOSPITAL_COMMUNITY): Payer: Self-pay | Admitting: *Deleted

## 2024-04-24 ENCOUNTER — Other Ambulatory Visit: Payer: Self-pay | Admitting: Family Medicine

## 2024-04-24 DIAGNOSIS — K219 Gastro-esophageal reflux disease without esophagitis: Secondary | ICD-10-CM

## 2024-05-23 ENCOUNTER — Ambulatory Visit: Admitting: Student

## 2024-05-23 VITALS — BP 118/84 | HR 83 | Temp 98.1°F | Resp 16 | Ht 64.0 in | Wt 156.8 lb

## 2024-05-23 DIAGNOSIS — T7840XS Allergy, unspecified, sequela: Secondary | ICD-10-CM | POA: Diagnosis not present

## 2024-05-23 DIAGNOSIS — J029 Acute pharyngitis, unspecified: Secondary | ICD-10-CM | POA: Diagnosis not present

## 2024-05-23 DIAGNOSIS — T7840XA Allergy, unspecified, initial encounter: Secondary | ICD-10-CM | POA: Insufficient documentation

## 2024-05-23 DIAGNOSIS — K219 Gastro-esophageal reflux disease without esophagitis: Secondary | ICD-10-CM

## 2024-05-23 LAB — POCT RAPID STREP A (OFFICE): Rapid Strep A Screen: NEGATIVE

## 2024-05-23 LAB — POCT INFLUENZA A/B
Influenza A, POC: NEGATIVE
Influenza B, POC: NEGATIVE

## 2024-05-23 MED ORDER — OMEPRAZOLE 40 MG PO CPDR: 40.0000 mg | DELAYED_RELEASE_CAPSULE | Freq: Every day | ORAL | 3 refills | Status: DC

## 2024-05-23 MED ORDER — AZELASTINE HCL 0.1 % NA SOLN: 1.0000 | Freq: Two times a day (BID) | NASAL | 12 refills | Status: AC

## 2024-05-23 NOTE — Progress Notes (Signed)
 Chief Complaint  Patient presents with   Sore Throat    Sore throat and headache since December 19th, 2025.    [name] here for URI complaints.  History of Present Illness [name] is a 49 year old female with GERD who presents with a sore throat persisting for three to four weeks.  She reports a sore throat for three to four weeks that is milder in the morning and worsens through the day to 5-6/10, with associated raspy voice. Throat pain worsens throughout day. She was given a Zpack and Tamiflu earlier when throat soreness symptoms began, but no improvement. She notes intermittent headaches and some sinus pressure. She has allergies and uses Flonase  nightly. She stopped regular Zyrtec after her psychiatrist advised against it while she takes hydroxyzine as needed for anxiety. She takes omeprazole  40 mg for GERD , given past esophagitis and prior endoscopies after her 2018 [facility] gastric bypass.  She denies smoking, recent dietary changes, significant nausea, or vomiting.   Patient denies fever, chills, SOB, CP, palpitations, dyspnea, edema, HA, vision changes, N/V/D, abdominal pain, urinary symptoms, rash, weight changes, and recent illness or hospitalizations.   Past Medical History:  Diagnosis Date   Anxiety    Asthma    Depression    Gallstones    GERD (gastroesophageal reflux disease)    RAD (reactive airway disease)     Objective BP 118/84 (BP Location: Left Arm, Patient Position: Sitting)   Pulse 83   Temp 98.1 F (36.7 C) (Oral)   Resp 16   Ht 5' 4 (1.626 m)   Wt 156 lb 12.8 oz (71.1 kg)   SpO2 99%   BMI 26.91 kg/m  General: Awake, alert, appears stated age HEENT: AT, [place], ears patent b/l and TM's neg, nares patent w/o discharge, pharynx with mild erythema and without exudates, MMM Neck: No masses or asymmetry Heart: RRR Lungs: CTAB, no accessory muscle use Psych: Age appropriate judgment and insight, normal mood and affect  Sore throat - Plan: POCT  Influenza A/B, POCT rapid strep A, Ambulatory referral to Gastroenterology  Gastroesophageal reflux disease, unspecified whether esophagitis present - Plan: Ambulatory referral to Gastroenterology  Allergy, sequela  Gastroesophageal reflux disease  Chronic GERD on 40 mg omeprazole  daily. Hx gastric bypass.  Symptoms suggest GERD - Increased omeprazole  to 40 mg twice daily x 8 weeks. - Advised to avoid large meals late in the day and elevate head of bed. - Discussed dietary modifications  - Referred to GI for further evaluation   Acute pharyngitis Sore throat persisting for 3-4 weeks. She was previously treated with Zpack with no improvement. Strep, Flu negative today  Allergic rhinitis Chronic allergic rhinitis managed with Zyrtec and Flonase . Possible sinus-related headache due to allergies. - Prescribed Astelin  nasal spray in addition to Flonase . -Continue Zyrtec daily  Continue to push fluids, practice good hand hygiene, cover mouth when coughing. F/u prn. If starting to experience fevers, shaking, or shortness of breath, seek immediate care. Pt voiced understanding and agreement to the plan.  Harlene LITTIE Jolly, DNP, AGNP-C 05/23/2024 3:54 PM

## 2024-05-26 ENCOUNTER — Ambulatory Visit: Admitting: Family Medicine

## 2024-05-27 ENCOUNTER — Encounter: Payer: Self-pay | Admitting: Student

## 2024-05-27 MED ORDER — OMEPRAZOLE 40 MG PO CPDR: 80.0000 mg | DELAYED_RELEASE_CAPSULE | Freq: Every day | ORAL | 0 refills | Status: AC

## 2024-05-27 NOTE — Telephone Encounter (Signed)
 Pended twice daily dosage, looks like from note you recommend twice daily for 8 weeks. Sign if appropriate.

## 2024-05-30 ENCOUNTER — Encounter: Payer: Self-pay | Admitting: Family Medicine

## 2024-05-30 ENCOUNTER — Ambulatory Visit: Admitting: Family Medicine

## 2024-05-30 VITALS — BP 152/98 | HR 84 | Temp 98.0°F | Resp 16 | Ht 64.0 in | Wt 155.6 lb

## 2024-05-30 DIAGNOSIS — S46819A Strain of other muscles, fascia and tendons at shoulder and upper arm level, unspecified arm, initial encounter: Secondary | ICD-10-CM | POA: Diagnosis not present

## 2024-05-30 DIAGNOSIS — Z566 Other physical and mental strain related to work: Secondary | ICD-10-CM | POA: Diagnosis not present

## 2024-05-30 DIAGNOSIS — R030 Elevated blood-pressure reading, without diagnosis of hypertension: Secondary | ICD-10-CM | POA: Diagnosis not present

## 2024-05-30 MED ORDER — METHOCARBAMOL 500 MG PO TABS: 500.0000 mg | ORAL_TABLET | Freq: Three times a day (TID) | ORAL | 0 refills | Status: AC | PRN

## 2024-05-30 MED ORDER — METHYLPREDNISOLONE ACETATE 80 MG/ML IJ SUSP
80.0000 mg | Freq: Once | INTRAMUSCULAR | Status: AC
  Administered 2024-05-30: 80 mg via INTRAMUSCULAR

## 2024-05-30 MED ORDER — MELOXICAM 15 MG PO TABS: 15.0000 mg | ORAL_TABLET | Freq: Every day | ORAL | 0 refills | Status: AC

## 2024-05-30 NOTE — Progress Notes (Signed)
 Chief Complaint  Patient presents with   Headache    Headaches and BP    Subjective [name] is a 49 y.o. female who presents for hypertension follow up. She does not monitor home blood pressures. Blood pressures ranging high over past week since having a crown placed.  She is not on any medications.  She is usually adhering to a healthy diet overall. Current exercise: usually cardio, strength training No CP or SOB.  +famhx of high blood pressure in both parents. Patient has been dealing with more stress with both family and work.  A large deadline is coming up in 2 weeks. She does have associated neck and shoulder pain.   Past Medical History:  Diagnosis Date   Anxiety    Asthma    Depression    Gallstones    GERD (gastroesophageal reflux disease)    RAD (reactive airway disease)     Exam BP (!) 152/98 (BP Location: Left Arm, Cuff Size: Normal)   Pulse 84   Temp 98 F (36.7 C) (Oral)   Resp 16   Ht 5' 4 (1.626 m)   Wt 155 lb 9.6 oz (70.6 kg)   SpO2 98%   BMI 26.71 kg/m  General:  well developed, well nourished, in no apparent distress Heart: RRR, no bruits, no LE edema Lungs: clear to auscultation, no accessory muscle use MSK: TTP and hypertonicity over the trapezius musculature, lateral neck musculature, and cervical paraspinal musculature; no bony TTP Psych: well oriented with normal range of affect and appropriate judgment/insight  Work-related stress  Strain of trapezius muscle, unspecified laterality, initial encounter - Plan: methocarbamol  (ROBAXIN ) 500 MG tablet, meloxicam  (MOBIC ) 15 MG tablet, methylPREDNISolone  acetate (DEPO-MEDROL ) injection 80 mg  Elevated blood pressure reading  Deadline approaching.  She will monitor blood pressure at home and see how things are after the deadline comes and goes.  She has an appointment with her psychiatrist coming up next week and will bring the situation up. Heat, ice, Tylenol , stretches and exercises.   Depo-Medrol  injection today.  Meloxicam  started tomorrow.  Robaxin  as needed.  Warned about potential drowsiness. Monitor blood pressure at home as discussed above.  Counseled on diet and exercise.  If stress decreases after her deadline approaches and she is still having elevated blood pressure, we will have her return to the clinic and discuss pharmacotherapy. F/u with me as originally scheduled. The patient voiced understanding and agreement to the plan.  I spent 30 minutes with the patient discussing the above plans in addition to reviewing her chart on the same day of the visit.  Mabel [place], DO 05/30/24  2:44 PM

## 2024-05-30 NOTE — Patient Instructions (Addendum)
 Keep the diet clean and stay active.  Heat (pad or rice pillow in microwave) over affected area, 10-15 minutes twice daily.   Ice/cold pack over area for 10-15 min twice daily.  Check your blood pressures 2-3 times per week, alternating the time of day you check it. If it is high, considering waiting 1-2 minutes and rechecking. If it gets higher, your anxiety is likely creeping up and we should avoid rechecking.   Let us  know if you need anything.  Trapezius stretches/exercises Do exercises exactly as told by your health care provider and adjust them as directed. It is normal to feel mild stretching, pulling, tightness, or discomfort as you do these exercises, but you should stop right away if you feel sudden pain or your pain gets worse.   Stretching and range of motion exercises These exercises warm up your muscles and joints and improve the movement and flexibility of your shoulder. These exercises can also help to relieve pain, numbness, and tingling. If you are unable to do any of the following for any reason, do not further attempt to do it.   Exercise A: Flexion, standing     Stand and hold a broomstick, a cane, or a similar object. Place your hands a little more than shoulder-width apart on the object. Your left / right hand should be palm-up, and your other hand should be palm-down. Push the stick to raise your left / right arm out to your side and then over your head. Use your other hand to help move the stick. Stop when you feel a stretch in your shoulder, or when you reach the angle that is recommended by your health care provider. Avoid shrugging your shoulder while you raise your arm. Keep your shoulder blade tucked down toward your spine. Hold for 30 seconds. Slowly return to the starting position. Repeat 2 times. Complete this exercise 3 times per week.  Exercise B: Abduction, supine     Lie on your back and hold a broomstick, a cane, or a similar object. Place your hands  a little more than shoulder-width apart on the object. Your left / right hand should be palm-up, and your other hand should be palm-down. Push the stick to raise your left / right arm out to your side and then over your head. Use your other hand to help move the stick. Stop when you feel a stretch in your shoulder, or when you reach the angle that is recommended by your health care provider. Avoid shrugging your shoulder while you raise your arm. Keep your shoulder blade tucked down toward your spine. Hold for 30 seconds. Slowly return to the starting position. Repeat 2 times. Complete this exercise 3 times per week.  Exercise C: Flexion, active-assisted     Lie on your back. You may bend your knees for comfort. Hold a broomstick, a cane, or a similar object. Place your hands about shoulder-width apart on the object. Your palms should face toward your feet. Raise the stick and move your arms over your head and behind your head, toward the floor. Use your healthy arm to help your left / right arm move farther. Stop when you feel a gentle stretch in your shoulder, or when you reach the angle where your health care provider tells you to stop. Hold for 30 seconds. Slowly return to the starting position. Repeat 2 times. Complete this exercise 3 times per week.  Exercise D: External rotation and abduction     Stand in  a door frame with one of your feet slightly in front of the other. This is called a staggered stance. Choose one of the following positions as told by your health care provider: Place your hands and forearms on the door frame above your head. Place your hands and forearms on the door frame at the height of your head. Place your hands on the door frame at the height of your elbows. Slowly move your weight onto your front foot until you feel a stretch across your chest and in the front of your shoulders. Keep your head and chest upright and keep your abdominal muscles tight. Hold for  30 seconds. To release the stretch, shift your weight to your back foot. Repeat 2 times. Complete this stretch 3 times per week.  Strengthening exercises These exercises build strength and endurance in your shoulder. Endurance is the ability to use your muscles for a long time, even after your muscles get tired. Exercise E: Scapular depression and adduction  Sit on a stable chair. Support your arms in front of you with pillows, armrests, or a tabletop. Keep your elbows in line with the sides of your body. Gently move your shoulder blades down toward your middle back. Relax the muscles on the tops of your shoulders and in the back of your neck. Hold for 3 seconds. Slowly release the tension and relax your muscles completely before doing this exercise again. Repeat for a total of 10 repetitions. After you have practiced this exercise, try doing the exercise without the arm support. Then, try the exercise while standing instead of sitting. Repeat 2 times. Complete this exercise 3 times per week.  Exercise F: Shoulder abduction, isometric     Stand or sit about 4-6 inches (10-15 cm) from a wall with your left / right side facing the wall. Bend your left / right elbow and gently press your elbow against the wall. Increase the pressure slowly until you are pressing as hard as you can without shrugging your shoulder. Hold for 3 seconds. Slowly release the tension and relax your muscles completely. Repeat for a total of 10 repetitions. Repeat 2 times. Complete this exercise 3 times per week.  Exercise G: Shoulder flexion, isometric     Stand or sit about 4-6 inches (10-15 cm) away from a wall with your left / right side facing the wall. Keep your left / right elbow straight and gently press the top of your fist against the wall. Increase the pressure slowly until you are pressing as hard as you can without shrugging your shoulder. Hold for 10-15 seconds. Slowly release the tension and relax  your muscles completely. Repeat for a total of 10 repetitions. Repeat 2 times. Complete this exercise 3 times per week.  Exercise H: Internal rotation     Sit in a stable chair without armrests, or stand. Secure an exercise band at your left / right side, at elbow height. Place a soft object, such as a folded towel or a small pillow, under your left / right upper arm so your elbow is a few inches (about 8 cm) away from your side. Hold the end of the exercise band so the band stretches. Keeping your elbow pressed against the soft object under your arm, move your forearm across your body toward your abdomen. Keep your body steady so the movement is only coming from your shoulder. Hold for 3 seconds. Slowly return to the starting position. Repeat for a total of 10 repetitions. Repeat 2  times. Complete this exercise 3 times per week.  Exercise I: External rotation     Sit in a stable chair without armrests, or stand. Secure an exercise band at your left / right side, at elbow height. Place a soft object, such as a folded towel or a small pillow, under your left / right upper arm so your elbow is a few inches (about 8 cm) away from your side. Hold the end of the exercise band so the band stretches. Keeping your elbow pressed against the soft object under your arm, move your forearm out, away from your abdomen. Keep your body steady so the movement is only coming from your shoulder. Hold for 3 seconds. Slowly return to the starting position. Repeat for a total of 10 repetitions. Repeat 2 times. Complete this exercise 3 times per week. Exercise J: Shoulder extension  Sit in a stable chair without armrests, or stand. Secure an exercise band to a stable object in front of you so the band is at shoulder height. Hold one end of the exercise band in each hand. Your palms should face each other. Straighten your elbows and lift your hands up to shoulder height. Step back, away from the secured end of  the exercise band, until the band stretches. Squeeze your shoulder blades together and pull your hands down to the sides of your thighs. Stop when your hands are straight down by your sides. Do not let your hands go behind your body. Hold for 3 seconds. Slowly return to the starting position. Repeat for a total of 10 repetitions. Repeat 2 times. Complete this exercise 3 times per week.  Exercise K: Shoulder extension, prone     Lie on your abdomen on a firm surface so your left / right arm hangs over the edge. Hold a 5 lb weight in your hand so your palm faces in toward your body. Your arm should be straight. Squeeze your shoulder blade down toward the middle of your back. Slowly raise your arm behind you, up to the height of the surface that you are lying on. Keep your arm straight. Hold for 3 seconds. Slowly return to the starting position and relax your muscles. Repeat for a total of 10 repetitions. Repeat 2 times. Complete this exercise 3 times per week.   Exercise L: Horizontal abduction, prone  Lie on your abdomen on a firm surface so your left / right arm hangs over the edge. Hold a 5 lb weight in your hand so your palm faces toward your feet. Your arm should be straight. Squeeze your shoulder blade down toward the middle of your back. Bend your elbow so your hand moves up, until your elbow is bent to an L shape (90 degrees). With your elbow bent, slowly move your forearm forward and up. Raise your hand up to the height of the surface that you are lying on. Your upper arm should not move, and your elbow should stay bent. At the top of the movement, your palm should face the floor. Hold for 3 seconds. Slowly return to the starting position and relax your muscles. Repeat for a total of 10 repetitions. Repeat 2 times. Complete this exercise 3 times per week.  Exercise M: Horizontal abduction, standing  Sit on a stable chair, or stand. Secure an exercise band to a stable object in  front of you so the band is at shoulder height. Hold one end of the exercise band in each hand. Straighten your elbows and lift  your hands straight in front of you, up to shoulder height. Your palms should face down, toward the floor. Step back, away from the secured end of the exercise band, until the band stretches. Move your arms out to your sides, and keep your arms straight. Hold for 3 seconds. Slowly return to the starting position. Repeat for a total of 10 repetitions. Repeat 2 times. Complete this exercise 3 times per week.  Exercise N: Scapular retraction and elevation  Sit on a stable chair, or stand. Secure an exercise band to a stable object in front of you so the band is at shoulder height. Hold one end of the exercise band in each hand. Your palms should face each other. Sit in a stable chair without armrests, or stand. Step back, away from the secured end of the exercise band, until the band stretches. Squeeze your shoulder blades together and lift your hands over your head. Keep your elbows straight. Hold for 3 seconds. Slowly return to the starting position. Repeat for a total of 10 repetitions. Repeat 2 times. Complete this exercise 3 times per week.  This information is not intended to replace advice given to you by your health care provider. Make sure you discuss any questions you have with your health care provider. Document Released: 05/01/2005 Document Revised: 01/06/2016 Document Reviewed: 03/18/2015 Elsevier Interactive Patient Education  2017 [employer].

## 2024-06-04 ENCOUNTER — Ambulatory Visit: Payer: Self-pay

## 2024-06-04 ENCOUNTER — Encounter: Payer: Self-pay | Admitting: Family Medicine

## 2024-06-04 NOTE — Telephone Encounter (Signed)
 FYI Only or Action Required?: Action required by provider: update on patient condition.  Patient was last seen in primary care on 05/30/2024 by Sabrina Mabel Mt, DO.  Called Nurse Triage reporting Hypertension.  Symptoms began today.  Interventions attempted: Prescription medications: Meloxicam , Tylenol  .  Symptoms are: stable.  Triage Disposition: See PCP When Office is Open (Within 3 Days)  Patient/caregiver understands and will follow disposition?: Yes     Message from [facility] G sent at 06/04/2024  3:36 PM EST  Reason for Triage: bp now 159/108, at 9:24a 132/101, at 11am 105/102, at 12:30p 158/96 having a consistent headache for the past month  - she wasn't sure if this worrisome.. she did leave a note in mychart -  The doctor is aware from last week of her stress that may be triggering it    Reason for Disposition  Systolic BP >= 160 OR Diastolic >= 100  Answer Assessment - Initial Assessment Questions 1. BLOOD PRESSURE: What is your blood pressure? Did you take at least two measurements 5 minutes apart?      Bp current: 159/108, at 9:24a 132/101, at 11am 105/102, at 12:30p 158/96   2. ONSET: When did you take your blood pressure?     Today  3. HOW: How did you take your blood pressure? (e.g., automatic home BP monitor, visiting nurse)     automatic home BP monitor  4. HISTORY: Do you have a history of high blood pressure?     No diagnosis   5. MEDICINES: Are you taking any medicines for blood pressure? Have you missed any doses recently?     N/A   6. OTHER SYMPTOMS: Do you have any symptoms? (e.g., blurred vision, chest pain, difficulty breathing, headache, weakness)  Headache      Patient called in to triage with complaints of high B/P and  headache This has been ongoing for a day, PCP is aware aware of fluctuating B/P's For home care, the patient is taking Meloxicam , Tylenol    Appointment previously scheduled for 2/3 for  follow-up. Patient would like to know if there is anything she needs to do in the meantime. Please advise.  Protocols used: Blood Pressure - High-A-AH

## 2024-06-04 NOTE — Telephone Encounter (Signed)
 Chart reviewed patient seen in mychart message today  with blood pressure readings. outbound call to patient. Unable to reach left message requesting call back.      Message from [facility] G sent at 06/04/2024  3:36 PM EST  Reason for Triage: bp now 159/108, at 9:24a 132/101, at 11am 105/102, at 12:30p 158/96 having a consistent headache for the past month  - she wasn't sure if this worrisome.. she did leave a note in mychart -  The doctor is aware from last week of her stress that may be triggering it

## 2024-06-04 NOTE — Telephone Encounter (Signed)
 Mychart msg received from Pt today. Forwarded to Dr. Frann.

## 2024-06-05 ENCOUNTER — Encounter: Payer: Self-pay | Admitting: Family Medicine

## 2024-06-05 ENCOUNTER — Ambulatory Visit: Payer: Self-pay

## 2024-06-05 ENCOUNTER — Other Ambulatory Visit (HOSPITAL_BASED_OUTPATIENT_CLINIC_OR_DEPARTMENT_OTHER): Payer: Self-pay

## 2024-06-05 MED ORDER — LOSARTAN POTASSIUM-HCTZ 50-12.5 MG PO TABS
1.0000 | ORAL_TABLET | Freq: Every day | ORAL | 1 refills | Status: AC
  Filled 2024-06-05: qty 30, 30d supply, fill #0

## 2024-06-05 NOTE — Telephone Encounter (Signed)
 FYI Only or Action Required?: Action required by provider: clinical question for provider and update on patient condition.  Patient was last seen in primary care on 05/30/2024 by Frann Mabel Mt, DO.  Called Nurse Triage reporting Hypertension.   Triage Disposition: Information or Advice Only Call  Patient/caregiver understands and will follow disposition?: Yes   Message from Jasmin G sent at 06/05/2024  3:02 PM EST  Reason for Triage: Pt initially called due to sending a message this morning to Dr. Antonio regarding her being really concerned about her bp, as it has been rising, I relayed info left by PCP on las Encounter, but pt requested to speak to NT for further clarification.      Reason for Disposition  Health information question, no triage required and triager able to answer question  Answer Assessment - Initial Assessment Questions 1. REASON FOR CALL: What is the main reason for your call? or How can I best help you?     Pt called to f/u on my chart message with PCP. Per PCP,   06/05/24 12:40 PM Start losartan  50/ 12.5 #30  2 refills 1 po every day ----  office visit for bp check in 3 weeks or sooner prn  Relayed info above to pt and very agreeable to start medication. Pt reports with BP, she is having increased anxiety d/t readings and h/a that is not relieved with otc pain medication. Pt would like to try Losartan  asap. Verified [facility] and listed as top pharm in chart. Pt would like to start rx today if it can be sent in. Pt cancelled appt with Dr. Frann 01/28 and will call back shortly once she is able to look at her calendar to schedule with PCP within the next 3 weeks.  Protocols used: Information Only Call - No Triage-A-AH

## 2024-06-05 NOTE — Telephone Encounter (Signed)
 Spoke w/ Pt- informed of PCP recommendations. Pt verbalized understanding. Rx sent.Pt states she will schedule via mychart.

## 2024-06-06 ENCOUNTER — Ambulatory Visit: Payer: Self-pay

## 2024-06-06 ENCOUNTER — Other Ambulatory Visit: Payer: Self-pay | Admitting: Family Medicine

## 2024-06-06 ENCOUNTER — Ambulatory Visit: Admitting: Medical

## 2024-06-06 ENCOUNTER — Encounter: Payer: Self-pay | Admitting: Medical

## 2024-06-06 VITALS — BP 155/93 | HR 78 | Temp 98.3°F | Resp 16 | Ht 64.0 in | Wt 156.0 lb

## 2024-06-06 DIAGNOSIS — G44209 Tension-type headache, unspecified, not intractable: Secondary | ICD-10-CM

## 2024-06-06 DIAGNOSIS — R0789 Other chest pain: Secondary | ICD-10-CM | POA: Diagnosis not present

## 2024-06-06 DIAGNOSIS — F411 Generalized anxiety disorder: Secondary | ICD-10-CM

## 2024-06-06 DIAGNOSIS — I10 Essential (primary) hypertension: Secondary | ICD-10-CM | POA: Diagnosis not present

## 2024-06-06 DIAGNOSIS — F419 Anxiety disorder, unspecified: Secondary | ICD-10-CM

## 2024-06-06 NOTE — Progress Notes (Signed)
 "  Subjective:    Patient ID: [name], female    DOB: 1976-01-05, 49 y.o.   MRN: [id number]  HPI [name] is a 49 year old female who presents with elevated blood pressure and headaches.  Over the past two weeks she has had persistent elevated blood pressures, first noted at a dental visit at 138/90 mmHg and later at home up to 160/112 mmHg using a new upper arm cuff. She started losartan  hydrochlorothiazide  50/12.5 mg yesterday. Consistently high over past 2 weeks.  She had preeclampsia during pregnancy but no hypertension since until now. For the past five to six weeks she has had headaches that worsen through the day, with intermittent lightheadedness and dizziness for the last two weeks. She denies nausea, vomiting, vision changes, chest pain, or shortness of breath.  She has taken meloxicam  15 mg daily for about a week for neck and back tightness. She also notes new shoulder pain today, which she attributes to sleep position.  She has anxiety and uses Xanax  0.5 mg as needed, typically once daily, and hydroxyzine for sleep, though she has not taken hydroxyzine this week. She is taking Avapritinib and plans to discontinue methylliberine due to side effects.      No family hx of stroke or heart attack. The 10-year ASCVD risk score (Arnett DK, et al., 2019) is: 1.5%   Values used to calculate the score:     Age: 48 years     Clinically relevant sex: Female     Is Non-Hispanic African American: No     Diabetic: No     Tobacco smoker: No     Systolic Blood Pressure: 155 mmHg     Is BP treated: Yes     HDL Cholesterol: 81.6 mg/dL     Total Cholesterol: 184 mg/dL    Review of Systems  Constitutional:  Negative for chills, fatigue and fever.  Respiratory:  Negative for chest tightness, shortness of breath and wheezing.   Cardiovascular:  Negative for chest pain and palpitations.       Atypical chest pain  Gastrointestinal:  Negative for abdominal pain.   Genitourinary:  Negative for dysuria and frequency.  Musculoskeletal:  Negative for back pain.       See hpi  Neurological:  Negative for dizziness, speech difficulty, weakness and headaches.  Hematological:  Negative for adenopathy.  Psychiatric/Behavioral:  Negative for behavioral problems and decreased concentration. The patient is not nervous/anxious.       Past Medical History:  Diagnosis Date   Anxiety    Asthma    Depression    Gallstones    GERD (gastroesophageal reflux disease)    RAD (reactive airway disease)      Social History   Socioeconomic History   Marital status: Married    Spouse name: Not on file   Number of children: 2   Years of education: Not on file   Highest education level: Bachelor's degree (e.g., BA, AB, BS)  Occupational History   Occupation: Museum/gallery Curator: [employer]  Tobacco Use   Smoking status: Never   Smokeless tobacco: Never  Vaping Use   Vaping status: Never Used  Substance and Sexual Activity   Alcohol use: Yes    Comment: rare   Drug use: No   Sexual activity: Yes  Other Topics Concern   Not on file  Social History Narrative   Exercising 4-5 days a week    Social Drivers of Health  Tobacco Use: Low Risk (06/06/2024)   Patient History    Smoking Tobacco Use: Never    Smokeless Tobacco Use: Never    Passive Exposure: Not on file  Financial Resource Strain: Low Risk (05/23/2024)   Overall Financial Resource Strain (CARDIA)    Difficulty of Paying Living Expenses: Not hard at all  Food Insecurity: No Food Insecurity (05/23/2024)   Epic    Worried About Programme Researcher, Broadcasting/film/video in the Last Year: Never true    Ran Out of Food in the Last Year: Never true  Transportation Needs: No Transportation Needs (05/23/2024)   Epic    Lack of Transportation (Medical): No    Lack of Transportation (Non-Medical): No  Physical Activity: Sufficiently Active (05/23/2024)   Exercise Vital Sign    Days of Exercise per Week: 4 days     Minutes of Exercise per Session: 60 min  Stress: Stress Concern Present (05/23/2024)   [employer] of Occupational Health - Occupational Stress Questionnaire    Feeling of Stress: Rather much  Social Connections: Moderately Isolated (05/23/2024)   Social Connection and Isolation Panel    Frequency of Communication with Friends and Family: Three times a week    Frequency of Social Gatherings with Friends and Family: Twice a week    Attends Religious Services: Never    Database Administrator or Organizations: No    Attends Engineer, Structural: Not on file    Marital Status: Married  Intimate Partner Violence: Unknown (08/16/2021)   Received from [facility]   HITS    Physically Hurt: Not on file    Insult or Talk Down To: Not on file    Threaten Physical Harm: Not on file    Scream or Curse: Not on file  Depression (PHQ2-9): Medium Risk (05/23/2024)   Depression (PHQ2-9)    PHQ-2 Score: 7  Alcohol Screen: Low Risk (05/23/2024)   Alcohol Screen    Last Alcohol Screening Score (AUDIT): 1  Housing: Low Risk (05/23/2024)   Epic    Unable to Pay for Housing in the Last Year: No    Number of Times Moved in the Last Year: 0    Homeless in the Last Year: No  Utilities: Not on file  Health Literacy: Not on file    Past Surgical History:  Procedure Laterality Date   CESAREAN SECTION     CHOLECYSTECTOMY  2007   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   Procedure: LAPAROSCOPIC ROUX-EN-Y GASTRIC BYPASS WITH UPPER ENDOSCOPY;  Surgeon: Camellia Blush, MD;  Location: [facility];  Service: General;  Laterality: N/A;    Family History  Problem Relation Age of Onset   Breast cancer Mother    Hypertension Mother    Skin cancer Mother    Colon polyps Father    Hypertension Father    Skin cancer Father    Diabetes Maternal Grandmother    Colon cancer Maternal Grandfather    Pancreatic cancer Maternal Grandfather    Arthritis Paternal Grandmother    BRCA 1/2 Paternal Grandmother    BRCA 1/2  Cousin    Esophageal cancer Neg Hx    Rectal cancer Neg Hx    Stomach cancer Neg Hx     Allergies[1]  Medications Ordered Prior to Encounter[2]  BP (!) 155/93   Pulse 78   Temp 98.3 F (36.8 C) (Oral)   Resp 16   Ht 5' 4 (1.626 m)   Wt 156 lb (70.8 kg)   SpO2 98%  BMI 26.78 kg/m          Objective:   Physical Exam  General Mental Status- Alert. General Appearance- Not in acute distress.   Skin General: Color- Normal Color. Moisture- Normal Moisture.  Neck Carotid Arteries- Normal color. Moisture- Normal Moisture. No carotid bruits. No JVD.  Chest and Lung Exam Auscultation: Breath Sounds:-CTA  Cardiovascular Auscultation:Rythm- RRR Murmurs & Other Heart Sounds:Auscultation of the heart reveals- No Murmurs.  Abdomen Inspection:-Inspeection Normal. Palpation/Percussion:Note:No mass. Palpation and Percussion of the abdomen reveal- Non Tender, Non Distended + BS, no rebound or guarding.   Neurologic Cranial Nerve exam:- CN III-XII intact(No nystagmus), symmetric smile. Drift Test:- No drift. Finger to Nose:- Normal/Intact Strength:- 5/5 equal and symmetric strength both upper and lower extremities.   Left shoulder- bicep tendon/anterior shoulder tender to palpation. Pain on abduction.  Anterior thorax- left side cosochondral junction mild tender to palpation.  Left arm- lateral epicondyle mild tender to palpation.    Assessment & Plan:   Essential hypertension Hypertension with recent readings up to 160/112 mmHg. Initiated losartan  hydrochlorothiazide  wihtin last day. Meloxicam  may contribute to elevated blood pressure. Anxiety and stress may also be factors. Discussed benefits of stopping or reducing meloxicam . - Continue losartan  hydrochlorothiazide  50/12.5 mg daily. - Discontinue or reduce meloxicam . - Monitor blood pressure regularly daily but only max twice daily.  - Consider increasing medication if readings remain high. - Consult with Dr.  Antonio regarding management.  Tension-type headache with neck and trapezius myalgia Headaches for 5-6 weeks.. Stress and anxiety may exacerbate. Previous muscle relaxants ineffective. Discussed increasing Robaxin  and using Tylenol . - Increase Robaxin  to 1.5 tablets at night.(500 mg tabl) - Use Tylenol  for pain management. - Avoid concurrent use of Robaxin  and Xanax ; separate by at least 8 hours.  Generalized anxiety disorder with panic attacks Anxiety and panic attacks may contribute to elevated blood pressure and headaches. Currently using Xanax  as needed. Discussed increasing Xanax  during stressful periods. - Consider increasing Xanax  to twice a day during stressful periods.  Atypical chest pain Intermittent chest tightness possibly related to anxiety or musculoskeletal issues. Discussed EKG nsr which is reassuring and emergency department visit if worsens. No constellation of associated signs/symptoms matching cardiac. -ekg nsr. no ischemia -I don't think troponin indicated. Even one set stat not reliable as friday afternoon stat labs in past have on occasion come back in delayed time frame 1-3 days if pain worsens then advise ED. -I don't think ED indicated and would increase pt stress, anxiety and blood pressure.   Presently now low-level pressure with left shoulder pain and lateral epicondyle pain.-costochondral junction mild reproducible, lateral epicondyle tender and shoulder painful on palpation (bicep tendon) Hx of shoulder pain. -Tylenol  for pain. upcoming appointment with sport med.  With time in office pt started to feel calmer.  Follow up next week Tuesday, wed or Thursday with pcp or myself.  I personally spent a total of 50 minutes in the care of the patient today including performing a medically appropriate exam/evaluation, counseling and educating, placing orders, documenting clinical information in the EHR, and total time did not incude EKG time. Did discuss with pcp..     Laylonie Marzec, PA-C     [1]  Allergies Allergen Reactions   Amoxicillin Hives    Has patient had a PCN reaction causing immediate rash, facial/tongue/throat swelling, SOB or lightheadedness with hypotension:No Has patient had a PCN reaction causing severe rash involving mucus membranes or skin necrosis:unsure--doesn't think so Has patient had a PCN reaction  that required hospitalization:No Has patient had a PCN reaction occurring within the last 10 years:No If all of the above answers are NO, then may proceed with Cephalosporin use.    Labetalol Hcl Other (See Comments)    Asthma   Sulfonamide Derivatives Hives    Kidney Disease  [2]  Current Outpatient Medications on File Prior to Visit  Medication Sig Dispense Refill   ALPRAZolam  (XANAX ) 0.5 MG tablet Take 1 tablet (0.5 mg total) by mouth 3 (three) times daily as needed for anxiety. 90 tablet 1   azelastine  (ASTELIN ) 0.1 % nasal spray Place 1 spray into both nostrils 2 (two) times daily. Use in each nostril as directed 30 mL 12   fluticasone  (FLONASE ) 50 MCG/ACT nasal spray Place 2 sprays into both nostrils at bedtime. 48 g 0   hydrOXYzine (ATARAX) 25 MG tablet Take 25-50 mg by mouth at bedtime as needed. for sleep     lamoTRIgine (LAMICTAL) 200 MG tablet 200 mg.     levonorgestrel (MIRENA) 20 MCG/24HR IUD 1 each by Intrauterine route once.     losartan -hydrochlorothiazide  (HYZAAR ) 50-12.5 MG tablet Take 1 tablet by mouth daily. 30 tablet 1   meloxicam  (MOBIC ) 15 MG tablet Take 1 tablet (15 mg total) by mouth daily. 30 tablet 0   methocarbamol  (ROBAXIN ) 500 MG tablet Take 1 tablet (500 mg total) by mouth every 8 (eight) hours as needed for muscle spasms. 21 tablet 0   NONFORMULARY OR COMPOUNDED ITEM Bariatric multivitamin 1 po qd 90 each 3   omeprazole  (PRILOSEC) 40 MG capsule Take 2 capsules (80 mg total) by mouth daily. 120 capsule 0   tretinoin (RETIN-A) 0.05 % cream APPLY TO FACE ONCE DAILY  AT BEDTIME  3   Vilazodone   HCl (VIIBRYD ) 40 MG TABS Take 1 tablet (40 mg total) by mouth daily. 90 tablet 0   No current facility-administered medications on file prior to visit.   "

## 2024-06-06 NOTE — Telephone Encounter (Signed)
 FYI Only or Action Required?: FYI only for provider: appointment scheduled on 1/23.  Patient was last seen in primary care on 05/30/2024 by Frann Mabel Mt, DO.  Called Nurse Triage reporting Hypertension.  Symptoms began several weeks ago.  Interventions attempted: OTC medications: Tylenol , Prescription medications: Meloxicam , BP medication, and Rest, hydration, or home remedies.  Symptoms are: stable.  Triage Disposition: See Physician Within 24 Hours  Patient/caregiver understands and will follow disposition?: Yes   Copied from CRM [id number]. Topic: Clinical - Red Word Triage >> Jun 06, 2024 11:41 AM Rea ORN wrote: Red Word that prompted transfer to Nurse Triage: high BP  Please warm transfer Red Word call to Nurse Triage and then click Send Message and Close CRM. >> Jun 06, 2024 11:42 AM Rea ORN wrote: Reason for Triage: High BP 150/107 with headache   Reason for Disposition  [1] MODERATE headache (e.g., interferes with normal activities) AND [2] present > 24 hours AND [3] unexplained  (Exceptions: Pain medicines not tried, typical migraine, or headache part of viral illness.)  Additional Information  Negative: [1] Systolic BP >= 160 OR Diastolic >= 100 AND [2] cardiac (e.g., breathing difficulty, chest pain) or neurologic symptoms (e.g., new-onset blurred or double vision, unsteady gait)    Headache has been ongoing for 6 weeks, no new neurological or cardiac symptoms  Answer Assessment - Initial Assessment Questions 1. BLOOD PRESSURE: What is your blood pressure? Did you take at least two measurements 5 minutes apart?     Most recently 138/107  2. ONSET: When did you take your blood pressure?     Checking regularly, history of hypertension and reports some anxiety  3. HOW: How did you take your blood pressure? (e.g., automatic home BP monitor, visiting nurse)     Automatic BP cuff, Bicep; takes a while to read,  Apple watch HR currently - 72, no  palpitations or pressure  Not voiding prior to checking BP, denies any bladder fullness or problems emptying, reports anxiety today possibly making BP worse- takes Xanax  usually but can't take until later today due to taking muscle relaxer, muscle relaxer not helping tightness in neck and shoulders  4. HISTORY: Do you have a history of high blood pressure?     Yes  5. MEDICINES: Are you taking any medicines for blood pressure? Have you missed any doses recently?     Losartan -hydrochlorothiazide  @0730  today;  BP was previously was 150/107 this AM   6. OTHER SYMPTOMS: Do you have any symptoms? (e.g., blurred vision, chest pain, difficulty breathing, headache, weakness)     Headache 4/10, front of face/ forehead/ nose more so in the middle- not improving; no vision changes; no numbness./ tingling/ weakness on one side of body, no problems with balance, talking or formulating words; no chest pain or trouble breathing  7. PREGNANCY: Is there any chance you are pregnant? When was your last menstrual period?     IUD in place  Taking Meloxicam  and Tylenol , cannot take Ibuprofen due to taking Meloxicam .  Medications not really helping.  Answer Assessment - Initial Assessment Questions See alternative assessment.  Protocols used: Blood Pressure - High-A-AH, Headache-A-AH

## 2024-06-06 NOTE — Telephone Encounter (Signed)
 Please advise.

## 2024-06-06 NOTE — Telephone Encounter (Signed)
 Pt asking if medication can be adjusted. Requesting response via phone call *(not mychart).  [id number] - [place], [place] - [address] [[id number]]   FYI Only or Action Required?: Action required by provider: clinical question for provider and update on patient condition.  Patient was last seen in primary care on 05/30/2024 by Frann Mabel Mt, DO.  Called Nurse Triage reporting Hypertension and Headache.  Symptoms began Headaches 6 weeks.  Interventions attempted: Prescription medications: losartan -hydrochlorothiazide .  Symptoms are: unchanged.  Triage Disposition: Discuss With PCP and Callback by Nurse Today (overriding See PCP When Office is Open (Within 3 Days))  Patient/caregiver understands and will follow disposition?: Yes  Reason for Disposition  Systolic BP >= 160 OR Diastolic >= 100  Answer Assessment - Initial Assessment Questions Pt reports she was told her BP was elevated at the dentist 2 weeks ago. Was seen in office, but has had no improvement. Yesterday, BP was 158/112.   Started losartan -hydrochlorothiazide  (HYZAAR ) 50-12.5 MG yesterday and BP was 150/107 4 hours after taking morning dose. Has had an intermittent headache x 6 weeks, rated 4/10.  1. BLOOD PRESSURE: What is your blood pressure? Did you take at least two measurements 5 minutes apart?     150/107 today 2. ONSET: When did you take your blood pressure?     Checked 4 hrs after taking BP medication 3. HOW: How did you take your blood pressure? (e.g., automatic home BP monitor, visiting nurse)     Machine,  4. HISTORY: Do you have a history of high blood pressure?     Yes, recent 5. MEDICINES: Are you taking any medicines for blood pressure? Have you missed any doses recently?     losartan -hydrochlorothiazide   6. OTHER SYMPTOMS: Do you have any symptoms? (e.g., blurred vision, chest pain, difficulty breathing, headache, weakness)     Denies  Protocols used: Blood  Pressure - High-A-AH

## 2024-06-06 NOTE — Telephone Encounter (Signed)
 Duplicate message. See earlier triage note. Closing this duplicate.

## 2024-06-06 NOTE — Patient Instructions (Addendum)
 Essential hypertension Hypertension with recent readings up to 160/112 mmHg. Initiated losartan  hydrochlorothiazide  wihtin last day. Meloxicam  may contribute to elevated blood pressure. Anxiety and stress may also be factors. Discussed benefits of stopping or reducing meloxicam . - Continue losartan  hydrochlorothiazide  50/12.5 mg daily. - Discontinue or reduce meloxicam . - Monitor blood pressure regularly daily but only max twice daily.  - Consider increasing medication if readings remain high. - Consult with Dr. Antonio regarding management.  Tension-type headache with neck and trapezius myalgia Headaches for 5-6 weeks.. Stress and anxiety may exacerbate. Previous muscle relaxants ineffective. Discussed increasing Robaxin  and using Tylenol . - Increase Robaxin  to 1.5 tablets at night.(500 mg tabl) - Use Tylenol  for pain management. - Avoid concurrent use of Robaxin  and Xanax ; separate by at least 8 hours. -normal neurologic exam. Ct head not indicated based on presentation  Generalized anxiety disorder with panic attacks Anxiety and panic attacks may contribute to elevated blood pressure and headaches. Currently using Xanax  as needed. Discussed increasing Xanax  during stressful periods. - Consider increasing Xanax  to twice a day during stressful periods.  Atypical chest pain Intermittent chest tightness possibly related to anxiety or musculoskeletal issues. Discussed EKG nsr which is reassuring and emergency department visit if worsens. No constellation of associated signs/symptoms matching cardiac. -ekg nsr. no ischemia -I don't think troponin indicated. Even one set stat not reliable as friday afternoon stat labs in past have on occasion come back in delayed time frame 1-3 days if pain worsens then advise ED. -I don't think ED indicated and would increase pt stress, anxiety and blood pressure. -Cardiovascular risk score 1.5%   Presently now low-level pressure with left shoulder pain and  lateral epicondyle pain.-costochondral junction mild reproducible, lateral epicondyle tender and shoulder painful on palpation (bicep tendon) Hx of shoulder pain. -Tylenol  for pain. upcoming appointment with sport med.  With time in office pt started to feel calmer.   Discussed case with Dr Antonio. She agrees with the plan  Follow up next week Tuesday, wed or Thursday with pcp or myself.

## 2024-06-09 NOTE — Telephone Encounter (Signed)
 Requesting: alprazolam  0.5mg   Contract: None UDS: None Last Visit: 01/29/24 Next Visit:07/01/24 Last Refill: 01/29/24 #90 and 1RF  Please Advise

## 2024-06-11 ENCOUNTER — Ambulatory Visit: Admitting: Family Medicine

## 2024-06-13 ENCOUNTER — Ambulatory Visit: Admitting: Sports Medicine

## 2024-06-17 ENCOUNTER — Ambulatory Visit: Admitting: Family Medicine

## 2024-07-01 ENCOUNTER — Ambulatory Visit: Admitting: Family Medicine
